# Patient Record
Sex: Female | Born: 1945
Health system: Southern US, Community
[De-identification: ages and names within clinical notes are randomized; demographics above are authoritative.]

## PROBLEM LIST (undated history)

## (undated) DIAGNOSIS — F17200 Nicotine dependence, unspecified, uncomplicated: Secondary | ICD-10-CM

## (undated) DIAGNOSIS — G4730 Sleep apnea, unspecified: Secondary | ICD-10-CM

## (undated) DIAGNOSIS — H5310 Unspecified subjective visual disturbances: Secondary | ICD-10-CM

## (undated) DIAGNOSIS — R0789 Other chest pain: Secondary | ICD-10-CM

## (undated) DIAGNOSIS — E559 Vitamin D deficiency, unspecified: Secondary | ICD-10-CM

## (undated) DIAGNOSIS — R011 Cardiac murmur, unspecified: Secondary | ICD-10-CM

## (undated) DIAGNOSIS — E018 Other iodine-deficiency related thyroid disorders and allied conditions: Secondary | ICD-10-CM

## (undated) DIAGNOSIS — B079 Viral wart, unspecified: Secondary | ICD-10-CM

## (undated) DIAGNOSIS — H3554 Dystrophies primarily involving the retinal pigment epithelium: Secondary | ICD-10-CM

## (undated) DIAGNOSIS — E785 Hyperlipidemia, unspecified: Secondary | ICD-10-CM

## (undated) DIAGNOSIS — M899 Disorder of bone, unspecified: Secondary | ICD-10-CM

## (undated) DIAGNOSIS — M949 Disorder of cartilage, unspecified: Secondary | ICD-10-CM

## (undated) HISTORY — DX: Cardiac murmur, unspecified: R01.1

## (undated) HISTORY — PX: TONSILLECTOMY: SUR1361

## (undated) HISTORY — DX: Unspecified subjective visual disturbances: H53.10

## (undated) HISTORY — PX: ABDOMINAL HYSTERECTOMY: SHX81

## (undated) HISTORY — DX: Disorder of bone, unspecified: M89.9

## (undated) HISTORY — PX: EYE SURGERY: SHX253

## (undated) HISTORY — DX: Vitamin D deficiency, unspecified: E55.9

## (undated) HISTORY — PX: CARPAL TUNNEL RELEASE: SHX101

## (undated) HISTORY — DX: Other chest pain: R07.89

## (undated) HISTORY — DX: Dystrophies primarily involving the retinal pigment epithelium: H35.54

## (undated) HISTORY — DX: Disorder of cartilage, unspecified: M94.9

## (undated) HISTORY — DX: Viral wart, unspecified: B07.9

## (undated) HISTORY — PX: LAPAROSCOPIC OOPHERECTOMY: SHX6507

## (undated) HISTORY — DX: Nicotine dependence, unspecified, uncomplicated: F17.200

## (undated) HISTORY — DX: Hyperlipidemia, unspecified: E78.5

## (undated) HISTORY — DX: Sleep apnea, unspecified: G47.30

## (undated) HISTORY — PX: LITHOTRIPSY: SUR834

## (undated) HISTORY — DX: Other iodine-deficiency related thyroid disorders and allied conditions: E01.8

---

## 2001-09-19 ENCOUNTER — Encounter: Admission: RE | Admit: 2001-09-19 | Discharge: 2001-09-19 | Payer: Self-pay | Admitting: Family Medicine

## 2001-09-19 ENCOUNTER — Encounter: Payer: Self-pay | Admitting: Family Medicine

## 2002-05-08 ENCOUNTER — Encounter: Admission: RE | Admit: 2002-05-08 | Discharge: 2002-05-08 | Payer: Self-pay | Admitting: Family Medicine

## 2002-05-08 ENCOUNTER — Encounter: Payer: Self-pay | Admitting: Family Medicine

## 2003-03-14 ENCOUNTER — Encounter: Admission: RE | Admit: 2003-03-14 | Discharge: 2003-03-14 | Payer: Self-pay | Admitting: Internal Medicine

## 2003-03-14 ENCOUNTER — Encounter: Payer: Self-pay | Admitting: Internal Medicine

## 2005-08-12 ENCOUNTER — Emergency Department (HOSPITAL_COMMUNITY): Admission: EM | Admit: 2005-08-12 | Discharge: 2005-08-12 | Payer: Self-pay | Admitting: Emergency Medicine

## 2007-08-01 ENCOUNTER — Emergency Department (HOSPITAL_COMMUNITY): Admission: EM | Admit: 2007-08-01 | Discharge: 2007-08-01 | Payer: Self-pay | Admitting: Emergency Medicine

## 2007-08-06 ENCOUNTER — Ambulatory Visit (HOSPITAL_COMMUNITY): Admission: RE | Admit: 2007-08-06 | Discharge: 2007-08-06 | Payer: Self-pay | Admitting: Urology

## 2007-10-25 ENCOUNTER — Emergency Department (HOSPITAL_COMMUNITY): Admission: EM | Admit: 2007-10-25 | Discharge: 2007-10-25 | Payer: Self-pay | Admitting: Emergency Medicine

## 2008-03-17 ENCOUNTER — Encounter (INDEPENDENT_AMBULATORY_CARE_PROVIDER_SITE_OTHER): Payer: Self-pay | Admitting: *Deleted

## 2008-04-02 ENCOUNTER — Ambulatory Visit: Payer: Self-pay | Admitting: Family Medicine

## 2008-04-02 DIAGNOSIS — M949 Disorder of cartilage, unspecified: Secondary | ICD-10-CM

## 2008-04-02 DIAGNOSIS — M899 Disorder of bone, unspecified: Secondary | ICD-10-CM | POA: Insufficient documentation

## 2008-04-02 DIAGNOSIS — F17200 Nicotine dependence, unspecified, uncomplicated: Secondary | ICD-10-CM | POA: Insufficient documentation

## 2008-04-02 DIAGNOSIS — B079 Viral wart, unspecified: Secondary | ICD-10-CM | POA: Insufficient documentation

## 2008-04-02 DIAGNOSIS — E018 Other iodine-deficiency related thyroid disorders and allied conditions: Secondary | ICD-10-CM

## 2008-04-04 ENCOUNTER — Encounter (INDEPENDENT_AMBULATORY_CARE_PROVIDER_SITE_OTHER): Payer: Self-pay | Admitting: *Deleted

## 2008-04-23 ENCOUNTER — Ambulatory Visit: Payer: Self-pay | Admitting: Internal Medicine

## 2008-04-23 ENCOUNTER — Encounter: Payer: Self-pay | Admitting: Family Medicine

## 2008-05-14 ENCOUNTER — Telehealth (INDEPENDENT_AMBULATORY_CARE_PROVIDER_SITE_OTHER): Payer: Self-pay | Admitting: *Deleted

## 2008-06-11 ENCOUNTER — Ambulatory Visit: Payer: Self-pay | Admitting: Family Medicine

## 2008-06-11 LAB — CONVERTED CEMR LAB
ALT: 15 units/L (ref 0–35)
AST: 17 units/L (ref 0–37)
Albumin: 3.8 g/dL (ref 3.5–5.2)
BUN: 21 mg/dL (ref 6–23)
Basophils Relative: 0.8 % (ref 0.0–3.0)
CO2: 28 meq/L (ref 19–32)
Chloride: 104 meq/L (ref 96–112)
Creatinine, Ser: 0.8 mg/dL (ref 0.4–1.2)
Direct LDL: 187.6 mg/dL
Eosinophils Absolute: 0.1 10*3/uL (ref 0.0–0.7)
Eosinophils Relative: 1.3 % (ref 0.0–5.0)
GFR calc non Af Amer: 78 mL/min
Glucose, Bld: 91 mg/dL (ref 70–99)
HDL: 49.5 mg/dL (ref >39.0)
MCV: 91.7 fL (ref 78.0–100.0)
Monocytes Relative: 5.6 % (ref 3.0–12.0)
Neutrophils Relative %: 71.7 % (ref 43.0–77.0)
RBC: 4.87 M/uL (ref 3.87–5.11)
Total Protein: 7 g/dL (ref 6.0–8.3)
WBC: 10.6 10*3/uL — ABNORMAL HIGH (ref 4.5–10.5)

## 2008-06-12 ENCOUNTER — Telehealth (INDEPENDENT_AMBULATORY_CARE_PROVIDER_SITE_OTHER): Payer: Self-pay | Admitting: *Deleted

## 2008-06-13 ENCOUNTER — Telehealth (INDEPENDENT_AMBULATORY_CARE_PROVIDER_SITE_OTHER): Payer: Self-pay | Admitting: *Deleted

## 2008-06-16 ENCOUNTER — Encounter: Payer: Self-pay | Admitting: Family Medicine

## 2008-07-30 ENCOUNTER — Ambulatory Visit: Payer: Self-pay | Admitting: Family Medicine

## 2008-07-30 LAB — CONVERTED CEMR LAB
ALT: 19 units/L (ref 0–35)
AST: 19 units/L (ref 0–37)
Albumin: 3.8 g/dL (ref 3.5–5.2)
Total Protein: 7.3 g/dL (ref 6.0–8.3)

## 2008-07-31 ENCOUNTER — Telehealth (INDEPENDENT_AMBULATORY_CARE_PROVIDER_SITE_OTHER): Payer: Self-pay | Admitting: *Deleted

## 2008-07-31 LAB — CONVERTED CEMR LAB: Vit D, 25-Hydroxy: 24 ng/mL — ABNORMAL LOW (ref 30–89)

## 2009-01-05 ENCOUNTER — Ambulatory Visit: Payer: Self-pay | Admitting: Family Medicine

## 2009-01-05 DIAGNOSIS — H5310 Unspecified subjective visual disturbances: Secondary | ICD-10-CM | POA: Insufficient documentation

## 2009-01-05 DIAGNOSIS — E559 Vitamin D deficiency, unspecified: Secondary | ICD-10-CM | POA: Insufficient documentation

## 2009-01-06 ENCOUNTER — Encounter: Admission: RE | Admit: 2009-01-06 | Discharge: 2009-01-06 | Payer: Self-pay | Admitting: Family Medicine

## 2009-01-06 ENCOUNTER — Encounter: Payer: Self-pay | Admitting: Family Medicine

## 2009-01-06 ENCOUNTER — Ambulatory Visit: Payer: Self-pay

## 2009-01-09 ENCOUNTER — Telehealth: Payer: Self-pay | Admitting: Family Medicine

## 2009-01-16 ENCOUNTER — Ambulatory Visit: Payer: Self-pay | Admitting: Family Medicine

## 2009-01-16 DIAGNOSIS — E785 Hyperlipidemia, unspecified: Secondary | ICD-10-CM | POA: Insufficient documentation

## 2009-01-20 ENCOUNTER — Telehealth (INDEPENDENT_AMBULATORY_CARE_PROVIDER_SITE_OTHER): Payer: Self-pay | Admitting: *Deleted

## 2009-01-20 LAB — CONVERTED CEMR LAB
ALT: 15 units/L (ref 0–35)
Alkaline Phosphatase: 65 units/L (ref 39–117)
Bilirubin, Direct: 0 mg/dL (ref 0.0–0.3)
Cholesterol: 176 mg/dL (ref 0–200)
Total Bilirubin: 0.5 mg/dL (ref 0.3–1.2)
Total Protein: 7.1 g/dL (ref 6.0–8.3)

## 2009-03-23 ENCOUNTER — Encounter: Payer: Self-pay | Admitting: Family Medicine

## 2009-05-08 ENCOUNTER — Telehealth (INDEPENDENT_AMBULATORY_CARE_PROVIDER_SITE_OTHER): Payer: Self-pay | Admitting: *Deleted

## 2009-05-12 ENCOUNTER — Telehealth (INDEPENDENT_AMBULATORY_CARE_PROVIDER_SITE_OTHER): Payer: Self-pay | Admitting: *Deleted

## 2010-06-06 DIAGNOSIS — H3554 Dystrophies primarily involving the retinal pigment epithelium: Secondary | ICD-10-CM

## 2010-06-06 HISTORY — DX: Dystrophies primarily involving the retinal pigment epithelium: H35.54

## 2011-02-25 LAB — URINALYSIS, ROUTINE W REFLEX MICROSCOPIC
Glucose, UA: NEGATIVE
Ketones, ur: NEGATIVE
Protein, ur: 30 — AB
Urobilinogen, UA: 0.2

## 2011-02-25 LAB — DIFFERENTIAL
Basophils Relative: 0
Eosinophils Absolute: 0
Eosinophils Relative: 0
Lymphs Abs: 1.4
Monocytes Relative: 3
Neutrophils Relative %: 87 — ABNORMAL HIGH

## 2011-02-25 LAB — URINE MICROSCOPIC-ADD ON

## 2011-02-25 LAB — CBC
HCT: 45.4
MCHC: 34.7
MCV: 87.8
RBC: 5.17 — ABNORMAL HIGH
WBC: 15.3 — ABNORMAL HIGH

## 2011-02-25 LAB — URINE CULTURE: Colony Count: >=100000

## 2011-02-25 LAB — BASIC METABOLIC PANEL
BUN: 15
CO2: 23
Chloride: 110
Creatinine, Ser: 0.77
GFR calc Af Amer: >60
Potassium: 4.1

## 2012-02-07 ENCOUNTER — Encounter: Payer: Self-pay | Admitting: Internal Medicine

## 2012-02-07 ENCOUNTER — Ambulatory Visit (INDEPENDENT_AMBULATORY_CARE_PROVIDER_SITE_OTHER): Payer: Self-pay | Admitting: Internal Medicine

## 2012-02-07 VITALS — BP 122/74 | HR 78 | Temp 98.0°F | Wt 188.0 lb

## 2012-02-07 DIAGNOSIS — Z23 Encounter for immunization: Secondary | ICD-10-CM

## 2012-02-07 DIAGNOSIS — S0993XA Unspecified injury of face, initial encounter: Secondary | ICD-10-CM

## 2012-02-07 NOTE — Progress Notes (Signed)
  Subjective:    Patient ID: Yolanda Reeves, female    DOB: 1945/07/12, 66 y.o.   MRN: [id number]  HPI Acute visit, patient of Dr.Tabori, last office visit  01-2009 Patient was cleaning gutters yesterday, she hit her face and right arm with the iron pole, bled a little from the face, likes to get a tetanus shot  Past Medical History:    BEST eye dz    Depression- sees Dr Emerson Monte    GERd    Seasonal Allergy    Heart Murmur    Kidney Stones    Thyroid ablation    Colon polyps- Colonoscopy 2004    TMJ  Past Surgical History:    hysterectomy 1980 due to endometriosis    BSO  Social History: smokes 1/2 ppd, smoking since 1963 Drinks wine monthly     Review of Systems She has poor vision but it is at baseline. Last Tdap 5 years ago     Objective:   Physical Exam  Constitutional: She appears well-developed. No distress.  HENT:  Head: Normocephalic.         Face is symmetric, not tender to palpation of the orbits, orbits are stable  Eyes: EOM are normal. Pupils are equal, round, and reactive to light. Right eye exhibits no discharge. Left eye exhibits no discharge. No scleral icterus.  Skin: She is not diaphoretic.       Few superficial scratches in the right arm       Assessment & Plan:   Superficial wound and the face, right arm Fortunately no evidence of infection at this point, we'll provide a Tdap,   will call if she sees redness or discharge. Most important, I encouraged her not to put herself on situations of risk like she did yesterday. Also, she is overdue to see Dr. Beverely Low, I encouraged her to call and make an appointment

## 2012-02-07 NOTE — Patient Instructions (Addendum)
Keep an eye on the scratches, if you see any redness, swelling, discharge let us know immediately. Please schedule a routine visit to see Dr. Beverely Low

## 2012-04-02 ENCOUNTER — Ambulatory Visit (INDEPENDENT_AMBULATORY_CARE_PROVIDER_SITE_OTHER): Payer: Medicare Other | Admitting: Family Medicine

## 2012-04-02 ENCOUNTER — Encounter: Payer: Self-pay | Admitting: Family Medicine

## 2012-04-02 VITALS — BP 125/82 | HR 85 | Temp 98.1°F | Ht 61.0 in | Wt 192.4 lb

## 2012-04-02 DIAGNOSIS — R0789 Other chest pain: Secondary | ICD-10-CM | POA: Insufficient documentation

## 2012-04-02 DIAGNOSIS — E785 Hyperlipidemia, unspecified: Secondary | ICD-10-CM

## 2012-04-02 LAB — BASIC METABOLIC PANEL
Chloride: 108 mEq/L (ref 96–112)
Creatinine, Ser: 0.9 mg/dL (ref 0.4–1.2)
Sodium: 142 mEq/L (ref 135–145)

## 2012-04-02 LAB — CBC WITH DIFFERENTIAL/PLATELET
Basophils Absolute: 0 10*3/uL (ref 0.0–0.1)
Eosinophils Absolute: 0.1 10*3/uL (ref 0.0–0.7)
Hemoglobin: 14.1 g/dL (ref 12.0–15.0)
Lymphocytes Relative: 35.8 % (ref 12.0–46.0)
MCHC: 33.1 g/dL (ref 30.0–36.0)
Monocytes Relative: 6.9 % (ref 3.0–12.0)
Neutro Abs: 4.3 10*3/uL (ref 1.4–7.7)
Neutrophils Relative %: 55.3 % (ref 43.0–77.0)
Platelets: 240 10*3/uL (ref 150.0–400.0)
RDW: 13.8 % (ref 11.5–14.6)

## 2012-04-02 LAB — LIPID PANEL
Cholesterol: 244 mg/dL — ABNORMAL HIGH (ref 0–200)
HDL: 48 mg/dL (ref >39.00)
Total CHOL/HDL Ratio: 5
Triglycerides: 186 mg/dL — ABNORMAL HIGH (ref 0.0–149.0)

## 2012-04-02 LAB — HEPATIC FUNCTION PANEL
ALT: 17 U/L (ref 0–35)
AST: 17 U/L (ref 0–37)
Albumin: 3.4 g/dL — ABNORMAL LOW (ref 3.5–5.2)

## 2012-04-02 LAB — LDL CHOLESTEROL, DIRECT: Direct LDL: 169.3 mg/dL

## 2012-04-02 NOTE — Patient Instructions (Signed)
We'll call you with your cardiology appt We'll notify you of your lab results Please go to the ER if you again have chest pain Call with any questions or concerns Welcome Back!

## 2012-04-02 NOTE — Progress Notes (Signed)
  Subjective:    Patient ID: Yolanda Reeves, female    DOB: December 15, 1945, 66 y.o.   MRN: [id number]  HPI Saw Dr Talmage Nap on Friday and told her about episodes of chest pain.  It was recommended that she come here to be seen.  Has had 3 separate episodes described as a 'serious tightening in my chest' leading her to believe 'i'm having a heart attack'.  Had no radiation of pain to neck or shoulder.  Ongoing fatigue.  Episodes will last up to 3 minutes.  Spontaneously resolve.  No associated SOB.  + anxiety regarding chest tightness.  1 episode while vacuuming, 1 while watching TV, 1 while on the computer.  No relation to eating or exertion.  No nausea.  No chest pain w/ exertion or climbing stairs recently.  Last episode occurred 3-4 weeks ago.  No longer smoking.  Hx of hyperlipidemia but not currently on meds and no recent labs available.   Review of Systems For ROS see HPI     Objective:   Physical Exam  Vitals reviewed. Constitutional: She is oriented to person, place, and time. She appears well-developed and well-nourished. No distress.  HENT:  Head: Normocephalic and atraumatic.  Eyes: Conjunctivae normal and EOM are normal. Pupils are equal, round, and reactive to light.  Neck: Normal range of motion. Neck supple. No thyromegaly present.  Cardiovascular: Normal rate, regular rhythm, normal heart sounds and intact distal pulses.   No murmur heard. Pulmonary/Chest: Effort normal and breath sounds normal. No respiratory distress.  Abdominal: Soft. She exhibits no distension. There is no tenderness.  Musculoskeletal: She exhibits no edema.  Lymphadenopathy:    She has no cervical adenopathy.  Neurological: She is alert and oriented to person, place, and time.  Skin: Skin is warm and dry.  Psychiatric: She has a normal mood and affect. Her behavior is normal.          Assessment & Plan:

## 2012-04-02 NOTE — Assessment & Plan Note (Signed)
New.  Pt w/ 3 episodes of atypical CP- last occuring nearly 1 month ago.  Risk factors for cardiac origin include Hyperlipidemia, age, and hx of tobacco use.  No current sxs.  EKG unchanged from previous.  Get labs to assess.  Refer to cards for evaluation and likely stress test.  Encouraged her to go to ER if sxs recur.  Pt expressed understanding and is in agreement w/ plan.

## 2012-04-02 NOTE — Assessment & Plan Note (Signed)
Chronic problem, pt not currently on meds.  Check labs to risk stratify given CP.  Start meds prn.

## 2012-04-06 ENCOUNTER — Telehealth: Payer: Self-pay

## 2012-04-06 MED ORDER — ATORVASTATIN CALCIUM 20 MG PO TABS: 20.0000 mg | ORAL_TABLET | Freq: Every day | ORAL | Status: DC

## 2012-04-06 NOTE — Telephone Encounter (Signed)
Spoke to pt to advise: Result Note     Total cholesterol and LDL are both elevated.  Based on this- would start Lipitor 20mg  nightly and recheck LFTs in 6-8 weeks.  Needs f/u OV in 6 months   Scheduled pt lab appt 05/22/12 9:15am, OV 09/26/11 11am. Pt verified pharmacy Rx sent pt aware. Pt stated understanding.   MW

## 2012-04-24 ENCOUNTER — Encounter: Payer: Self-pay | Admitting: *Deleted

## 2012-04-24 ENCOUNTER — Encounter: Payer: Self-pay | Admitting: Cardiovascular Disease

## 2012-04-24 ENCOUNTER — Ambulatory Visit (INDEPENDENT_AMBULATORY_CARE_PROVIDER_SITE_OTHER): Payer: Medicare Other | Admitting: Cardiovascular Disease

## 2012-04-24 VITALS — BP 138/80 | HR 72 | Ht 62.0 in | Wt 188.0 lb

## 2012-04-24 DIAGNOSIS — R079 Chest pain, unspecified: Secondary | ICD-10-CM

## 2012-04-24 DIAGNOSIS — E785 Hyperlipidemia, unspecified: Secondary | ICD-10-CM

## 2012-04-24 DIAGNOSIS — R0789 Other chest pain: Secondary | ICD-10-CM

## 2012-04-24 NOTE — Patient Instructions (Signed)
Your physician recommends that you schedule a follow-up appointment in:  AS NEEDED  Your physician has requested that you have en exercise stress myoview. For further information please visit [email or web address] Please follow instruction sheet, as given. DX CHEST PAIN

## 2012-04-24 NOTE — Progress Notes (Signed)
Patient ID: Yolanda Reeves, female   DOB: 28-Jan-1946, 66 y.o.   MRN: [id number] Jeral Pinch  on 10/28  With  chest pain.  Has had 3 separate episodes described as a 'serious tightening in my chest' leading her to believe 'i'm having a heart attack'. Had no radiation of pain to neck or shoulder. Ongoing fatigue. Episodes will last up to 3 minutes. Spontaneously resolve. No associated SOB. + anxiety regarding chest tightness. 1 episode while vacuuming, 1 while watching TV, 1 while on the computer. No relation to eating or exertion. No nausea. No chest pain w/ exertion or climbing stairs recently. Last episode occurred 3-4 weeks ago. No longer smoking. Hx of hyperlipidemia but not currently on meds and no recent labs available.  Also active at [employer] sne[employer] and walking 3 dogs with no recent pain  ROS: Denies fever, malais, weight loss, blurry vision, decreased visual acuity, cough, sputum, SOB, hemoptysis, pleuritic pain, palpitaitons, heartburn, abdominal pain, melena, lower extremity edema, claudication, or rash.  All other systems reviewed and negative   General: Affect appropriate Healthy:  appears stated age HEENT: normal Neck supple with no adenopathy JVP normal no bruits no thyromegaly Lungs clear with no wheezing and good diaphragmatic motion Heart:  S1/S2 no murmur,rub, gallop or click PMI normal Abdomen: benighn, BS positve, no tenderness, no AAA no bruit.  No HSM or HJR Distal pulses intact with no bruits No edema Neuro non-focal Skin warm and dry No muscular weakness  Medications Current Outpatient Prescriptions  Medication Sig Dispense Refill  . atorvastatin (LIPITOR) 20 MG tablet Take 1 tablet (20 mg total) by mouth at bedtime.  90 tablet  3  . diazepam (VALIUM) 5 MG tablet Take 5 mg by mouth daily.      . ergocalciferol (VITAMIN D2) 50000 UNITS capsule Take 50,000 Units by mouth once a week.      . lamoTRIgine (LAMICTAL) 150 MG tablet Take 150 mg by mouth daily.      Marland Kitchen  levothyroxine (SYNTHROID, LEVOTHROID) 137 MCG tablet Take 137 mcg by mouth daily.      . sertraline (ZOLOFT) 100 MG tablet Take 100 mg by mouth daily.      Marland Kitchen zolpidem (AMBIEN) 10 MG tablet Take 10 mg by mouth at bedtime as needed.        Allergies Review of patient's allergies indicates no known allergies.  Family History: History reviewed. No pertinent family history.  Social History: History   Social History  . Marital Status: Divorced    Spouse Name: N/A    Number of Children: N/A  . Years of Education: N/A   Occupational History  . Not on file.   Social History Main Topics  . Smoking status: Never Smoker   . Smokeless tobacco: Not on file  . Alcohol Use: Yes     Comment: rarely  . Drug Use: No  . Sexually Active: Not on file   Other Topics Concern  . Not on file   Social History Narrative  . No narrative on file    Electrocardiogram:  NSR rate 63  Low voltage isolated q in lead 3  Nonspecific St/T wave changes no change compared to ECG 06/11/08  Assessment and Plan

## 2012-04-24 NOTE — Assessment & Plan Note (Signed)
Cholesterol is at goal.  Continue current dose of statin and diet Rx.  No myalgias or side effects.  F/U  LFT's in 6 months. Lab Results  Component Value Date   LDLCALC 104* 01/16/2009

## 2012-04-24 NOTE — Assessment & Plan Note (Signed)
Atypical but severe ECG nondiagnostic but abnormal  F/U stress myovue

## 2012-05-08 ENCOUNTER — Encounter (HOSPITAL_COMMUNITY): Payer: Medicare Other

## 2012-05-14 ENCOUNTER — Other Ambulatory Visit: Payer: Self-pay | Admitting: *Deleted

## 2012-05-14 DIAGNOSIS — R079 Chest pain, unspecified: Secondary | ICD-10-CM

## 2012-05-22 ENCOUNTER — Other Ambulatory Visit (INDEPENDENT_AMBULATORY_CARE_PROVIDER_SITE_OTHER): Payer: Medicare Other

## 2012-05-22 DIAGNOSIS — E785 Hyperlipidemia, unspecified: Secondary | ICD-10-CM

## 2012-05-22 LAB — HEPATIC FUNCTION PANEL
Albumin: 4.2 g/dL (ref 3.5–5.2)
Total Bilirubin: 0.5 mg/dL (ref 0.3–1.2)

## 2012-05-22 NOTE — Progress Notes (Signed)
Labs only

## 2012-05-22 NOTE — Addendum Note (Signed)
Addended by: Silvio Pate D on: 05/22/2012 09:12 AM   Modules accepted: Orders

## 2012-05-25 ENCOUNTER — Encounter: Payer: Self-pay | Admitting: *Deleted

## 2012-07-12 ENCOUNTER — Other Ambulatory Visit: Payer: Self-pay | Admitting: *Deleted

## 2012-07-12 ENCOUNTER — Telehealth: Payer: Self-pay | Admitting: Cardiovascular Disease

## 2012-07-12 DIAGNOSIS — R079 Chest pain, unspecified: Secondary | ICD-10-CM

## 2012-07-12 NOTE — Telephone Encounter (Signed)
NOTED ./CY 

## 2012-07-12 NOTE — Telephone Encounter (Signed)
Spoke with Yolanda Reeves to see if she was ready to schedule her Treadmill test.   Per patient she has spoke with her PC MD and was told her problem was Esophageal spasm. Don't need the treadmill.

## 2012-08-30 ENCOUNTER — Encounter: Payer: Self-pay | Admitting: Family Medicine

## 2012-08-30 ENCOUNTER — Ambulatory Visit (INDEPENDENT_AMBULATORY_CARE_PROVIDER_SITE_OTHER): Payer: Medicare Other | Admitting: Family Medicine

## 2012-08-30 VITALS — BP 150/80 | HR 60 | Temp 97.7°F | Ht 61.25 in | Wt 191.6 lb

## 2012-08-30 DIAGNOSIS — Z23 Encounter for immunization: Secondary | ICD-10-CM

## 2012-08-30 DIAGNOSIS — E785 Hyperlipidemia, unspecified: Secondary | ICD-10-CM

## 2012-08-30 DIAGNOSIS — E018 Other iodine-deficiency related thyroid disorders and allied conditions: Secondary | ICD-10-CM

## 2012-08-30 DIAGNOSIS — R030 Elevated blood-pressure reading, without diagnosis of hypertension: Secondary | ICD-10-CM

## 2012-08-30 DIAGNOSIS — M949 Disorder of cartilage, unspecified: Secondary | ICD-10-CM

## 2012-08-30 DIAGNOSIS — M899 Disorder of bone, unspecified: Secondary | ICD-10-CM

## 2012-08-30 DIAGNOSIS — Z0000 Encounter for general adult medical examination without abnormal findings: Secondary | ICD-10-CM | POA: Insufficient documentation

## 2012-08-30 DIAGNOSIS — E559 Vitamin D deficiency, unspecified: Secondary | ICD-10-CM

## 2012-08-30 DIAGNOSIS — Z1231 Encounter for screening mammogram for malignant neoplasm of breast: Secondary | ICD-10-CM

## 2012-08-30 LAB — CBC WITH DIFFERENTIAL/PLATELET
Basophils Relative: 0.6 % (ref 0.0–3.0)
Eosinophils Relative: 1.1 % (ref 0.0–5.0)
HCT: 42.4 % (ref 36.0–46.0)
Hemoglobin: 14.3 g/dL (ref 12.0–15.0)
Lymphs Abs: 2.4 10*3/uL (ref 0.7–4.0)
MCV: 93.1 fl (ref 78.0–100.0)
Monocytes Relative: 5.5 % (ref 3.0–12.0)
Neutro Abs: 3.4 10*3/uL (ref 1.4–7.7)
WBC: 6.2 10*3/uL (ref 4.5–10.5)

## 2012-08-30 LAB — BASIC METABOLIC PANEL
BUN: 15 mg/dL (ref 6–23)
CO2: 26 mEq/L (ref 19–32)
Calcium: 8.7 mg/dL (ref 8.4–10.5)
Creatinine, Ser: 0.6 mg/dL (ref 0.4–1.2)
Glucose, Bld: 88 mg/dL (ref 70–99)

## 2012-08-30 LAB — LIPID PANEL
Cholesterol: 232 mg/dL — ABNORMAL HIGH (ref 0–200)
HDL: 57.3 mg/dL (ref >39.00)
Total CHOL/HDL Ratio: 4
Triglycerides: 146 mg/dL (ref 0.0–149.0)
VLDL: 29.2 mg/dL (ref 0.0–40.0)

## 2012-08-30 LAB — HEPATIC FUNCTION PANEL
Albumin: 3.7 g/dL (ref 3.5–5.2)
Alkaline Phosphatase: 72 U/L (ref 39–117)
Total Protein: 6.9 g/dL (ref 6.0–8.3)

## 2012-08-30 LAB — TSH: TSH: 0.67 u[IU]/mL (ref 0.35–5.50)

## 2012-08-30 LAB — LDL CHOLESTEROL, DIRECT: Direct LDL: 145.5 mg/dL

## 2012-08-30 NOTE — Patient Instructions (Addendum)
Follow up in 6 months to recheck cholesterol- sooner if needed We'll notify you of your lab results and make any changes if needed Someone will call you with your mammo and bone density Call with any questions or concerns CONGRATS on quitting smoking! Happy Spring!

## 2012-08-30 NOTE — Progress Notes (Signed)
  Subjective:    Patient ID: Yolanda Reeves , female    DOB: Mar 28, 1946, 67 y.o.   MRN: [id number]  HPI Here today for CPE.  Risk Factors: Hyperlipidemia- chronic problem, pt not taking lipitor as directed.  Started meds but did not refill. Osteopenia- chronic problem, last DEXA 4-5 yrs ago.  Taking Ca+ Vit D supplement. Elevated BP- new.  Pt reports increased stress. No hx of elevated pressure.  No CP, SOB, HAs, visual changes, edema. Physical Activity: attempting to exercise Fall Risk:  Low risk but increasing due to deterioration of eyesight (best's disease) Depression: chronic problem, following w/ Psych regularly Hearing: normal to conversational tones, decreased to whispered voice ADL's: independent Cognitive: normal linear thought process, memory and attention intact. Home Safety: safe at home Height, Weight, BMI, Visual Acuity: see vitals, vision corrected to 20/20 w/ glasses Counseling: no need for pap due to hysterectomy, overdue for mammo and DEXA.  UTD on colonoscopy Labs Ordered: See A&P Care Plan: See A&P    Review of Systems Patient reports no hearing changes, adenopathy,fever, weight change,  persistant/recurrent hoarseness , swallowing issues, chest pain, palpitations, edema, persistant/recurrent cough, hemoptysis, dyspnea (rest/exertional/paroxysmal nocturnal), gastrointestinal bleeding (melena, rectal bleeding), abdominal pain, significant heartburn, bowel changes, GU symptoms (dysuria, hematuria, incontinence), Gyn symptoms (abnormal  bleeding, pain),  syncope, focal weakness, memory loss, numbness & tingling, skin/hair/nail changes, abnormal bruising or bleeding, anxiety, or depression.     Objective:   Physical Exam General Appearance:    Alert, cooperative, no distress, appears stated age  Head:    Normocephalic, without obvious abnormality, atraumatic  Eyes:    PERRL, conjunctiva/corneas clear, EOM's intact, fundi    benign, both eyes  Ears:    Normal TM's and  external ear canals, both ears  Nose:   Nares normal, septum midline, mucosa normal, no drainage    or sinus tenderness  Throat:   Lips, mucosa, and tongue normal; teeth and gums normal  Neck:   Supple, symmetrical, trachea midline, no adenopathy;    Thyroid: no enlargement/tenderness/nodules  Back:     Symmetric, no curvature, ROM normal, no CVA tenderness  Lungs:     Clear to auscultation bilaterally, respirations unlabored  Chest Wall:    No tenderness or deformity   Heart:    Regular rate and rhythm, S1 and S2 normal, no murmur, rub   or gallop  Breast Exam:    Deferred to mammo  Abdomen:     Soft, non-tender, bowel sounds active all four quadrants,    no masses, no organomegaly  Genitalia:    Deferred due to hysterectomy  Rectal:    Extremities:   Extremities normal, atraumatic, no cyanosis or edema  Pulses:   2+ and symmetric all extremities  Skin:   Skin color, texture, turgor normal, no rashes or lesions  Lymph nodes:   Cervical, supraclavicular, and axillary nodes normal  Neurologic:   CNII-XII intact, normal strength, sensation and reflexes    throughout          Assessment & Plan:

## 2012-08-31 ENCOUNTER — Telehealth: Payer: Self-pay | Admitting: *Deleted

## 2012-08-31 DIAGNOSIS — E785 Hyperlipidemia, unspecified: Secondary | ICD-10-CM

## 2012-08-31 MED ORDER — ATORVASTATIN CALCIUM 20 MG PO TABS: 20.0000 mg | ORAL_TABLET | Freq: Every day | ORAL | Status: DC

## 2012-08-31 NOTE — Assessment & Plan Note (Signed)
Check labs.  Replete prn. 

## 2012-08-31 NOTE — Assessment & Plan Note (Signed)
Pt's PE WNL.  Due for mammo and DEXA- referral made.  UTD on colonoscopy.  No need for pap due to hysterectomy.  Check labs.  Anticipatory guidance provided.

## 2012-08-31 NOTE — Assessment & Plan Note (Signed)
Ongoing problem.  On Ca+ Vit D daily.  Due for DEXA- referral made.

## 2012-08-31 NOTE — Telephone Encounter (Signed)
Message copied by Verdie Shire on Fri Aug 31, 2012  5:25 PM ------      Message from: Sheliah Hatch      Created: Fri Aug 31, 2012  8:13 AM       Cholesterol is better than previous but still not at goal.  Needs to restart Lipitor nightly.  Repeat labs in 6 months.  Remainder of labs look good! ------

## 2012-08-31 NOTE — Assessment & Plan Note (Signed)
Pt stopped Lipitor when she forgot to refill it.  Had no problems tolerating meds.  Check labs.  Adjust meds prn

## 2012-08-31 NOTE — Telephone Encounter (Signed)
Informed pt of recent lab results and note.  Pt understood and agreed.  New rx for the Lipitor 20mg  sent to the pharmacy by e-script.  Order entered for future labs.//AB/CMA

## 2012-08-31 NOTE — Assessment & Plan Note (Signed)
New.  Pt feels this is stress related.  No hx of similar.  Asymptomatic.  Encouraged pt to check BP at home and report if pressure consistently >140/90.  Pt expressed understanding and is in agreement w/ plan.

## 2012-08-31 NOTE — Assessment & Plan Note (Signed)
Chronic problem.  Asymptomatic.  Check labs.  Adjust meds prn  

## 2012-09-02 LAB — VITAMIN D 1,25 DIHYDROXY
Vitamin D 1, 25 (OH)2 Total: 78 pg/mL — ABNORMAL HIGH (ref 18–72)
Vitamin D3 1, 25 (OH)2: 78 pg/mL

## 2012-09-25 ENCOUNTER — Ambulatory Visit: Payer: Medicare Other | Admitting: Family Medicine

## 2012-10-10 ENCOUNTER — Ambulatory Visit
Admission: RE | Admit: 2012-10-10 | Discharge: 2012-10-10 | Disposition: A | Discharge status: Home / Self Care | Payer: Medicare Other | Source: Ambulatory Visit | Attending: Family Medicine | Admitting: Family Medicine

## 2012-10-10 DIAGNOSIS — M899 Disorder of bone, unspecified: Secondary | ICD-10-CM

## 2012-10-10 DIAGNOSIS — Z1231 Encounter for screening mammogram for malignant neoplasm of breast: Secondary | ICD-10-CM

## 2012-10-17 ENCOUNTER — Other Ambulatory Visit: Payer: Self-pay | Admitting: Family Medicine

## 2012-10-17 ENCOUNTER — Encounter: Payer: Medicare Other | Admitting: Family Medicine

## 2012-10-17 DIAGNOSIS — R928 Other abnormal and inconclusive findings on diagnostic imaging of breast: Secondary | ICD-10-CM

## 2012-10-17 NOTE — Progress Notes (Signed)
This encounter was created in error - please disregard.

## 2012-10-31 ENCOUNTER — Ambulatory Visit
Admission: RE | Admit: 2012-10-31 | Discharge: 2012-10-31 | Disposition: A | Discharge status: Home / Self Care | Payer: Medicare Other | Source: Ambulatory Visit | Attending: Family Medicine | Admitting: Family Medicine

## 2012-10-31 DIAGNOSIS — R928 Other abnormal and inconclusive findings on diagnostic imaging of breast: Secondary | ICD-10-CM

## 2013-02-20 ENCOUNTER — Ambulatory Visit: Payer: Medicare Other | Admitting: Family Medicine

## 2013-03-07 ENCOUNTER — Other Ambulatory Visit: Payer: Self-pay | Admitting: Family Medicine

## 2013-03-07 NOTE — Telephone Encounter (Signed)
Med filled for only 30days. Pt needs OV>

## 2013-05-16 ENCOUNTER — Ambulatory Visit (INDEPENDENT_AMBULATORY_CARE_PROVIDER_SITE_OTHER): Payer: Medicare Other | Admitting: Family Medicine

## 2013-05-16 ENCOUNTER — Encounter: Payer: Self-pay | Admitting: Family Medicine

## 2013-05-16 VITALS — BP 130/82 | HR 77 | Temp 97.8°F | Ht 61.25 in | Wt 209.4 lb

## 2013-05-16 DIAGNOSIS — Z0000 Encounter for general adult medical examination without abnormal findings: Secondary | ICD-10-CM

## 2013-05-16 DIAGNOSIS — E785 Hyperlipidemia, unspecified: Secondary | ICD-10-CM

## 2013-05-16 DIAGNOSIS — E669 Obesity, unspecified: Secondary | ICD-10-CM

## 2013-05-16 DIAGNOSIS — Z23 Encounter for immunization: Secondary | ICD-10-CM

## 2013-05-16 LAB — CBC WITH DIFFERENTIAL/PLATELET
Basophils Relative: 0.5 % (ref 0.0–3.0)
Eosinophils Absolute: 0.1 10*3/uL (ref 0.0–0.7)
HCT: 41.3 % (ref 36.0–46.0)
Lymphs Abs: 2.3 10*3/uL (ref 0.7–4.0)
MCHC: 33.8 g/dL (ref 30.0–36.0)
MCV: 90.6 fl (ref 78.0–100.0)
Monocytes Absolute: 0.3 10*3/uL (ref 0.1–1.0)
Neutrophils Relative %: 56.7 % (ref 43.0–77.0)
Platelets: 241 10*3/uL (ref 150.0–400.0)

## 2013-05-16 LAB — BASIC METABOLIC PANEL
BUN: 16 mg/dL (ref 6–23)
Calcium: 8.7 mg/dL (ref 8.4–10.5)
GFR: 108.24 mL/min (ref >60.00)
Glucose, Bld: 91 mg/dL (ref 70–99)
Potassium: 4.1 mEq/L (ref 3.5–5.1)
Sodium: 141 mEq/L (ref 135–145)

## 2013-05-16 LAB — LIPID PANEL
Cholesterol: 200 mg/dL (ref 0–200)
HDL: 54.9 mg/dL (ref >39.00)
Triglycerides: 109 mg/dL (ref 0.0–149.0)
VLDL: 21.8 mg/dL (ref 0.0–40.0)

## 2013-05-16 LAB — HEPATIC FUNCTION PANEL
AST: 19 U/L (ref 0–37)
Albumin: 4 g/dL (ref 3.5–5.2)
Total Bilirubin: 0.3 mg/dL (ref 0.3–1.2)

## 2013-05-16 LAB — TSH: TSH: 0.97 u[IU]/mL (ref 0.35–5.50)

## 2013-05-16 MED ORDER — PANTOPRAZOLE SODIUM 40 MG PO TBEC: 40.0000 mg | DELAYED_RELEASE_TABLET | Freq: Every day | ORAL | Status: DC

## 2013-05-16 NOTE — Progress Notes (Signed)
Pre visit review using our clinic review tool, if applicable. No additional management support is needed unless otherwise documented below in the visit note. 

## 2013-05-16 NOTE — Patient Instructions (Signed)
Schedule your medicare wellness visit in 6 months We'll notify you of your lab results and make any changes if needed Start the Protonix daily for reflux Call with any questions or concerns Happy Holidays!

## 2013-05-16 NOTE — Progress Notes (Signed)
   Subjective:    Patient ID: Yolanda Reeves, female    DOB: 1945/11/10, 67 y.o.   MRN: [id number]  HPI CPE- pt had wellness exam on 08/30/12.  UTD on colonoscopy, mammo/DEXA, no need for paps due to TAH.   Review of Systems Patient reports no vision/ hearing changes, adenopathy,fever, weight change,  persistant/recurrent hoarseness , swallowing issues, chest pain, palpitations, edema, persistant/recurrent cough, hemoptysis, dyspnea (rest/exertional/paroxysmal nocturnal), gastrointestinal bleeding (melena, rectal bleeding), abdominal pain, bowel changes, GU symptoms (dysuria, hematuria, incontinence), Gyn symptoms (abnormal  bleeding, pain),  syncope, focal weakness, memory loss, numbness & tingling, skin/hair/nail changes, abnormal bruising or bleeding, anxiety, or depression.  + GERD    Objective:   Physical Exam General Appearance:    Alert, cooperative, no distress, appears stated age  Head:    Normocephalic, without obvious abnormality, atraumatic  Eyes:    PERRL, conjunctiva/corneas clear, EOM's intact, fundi    benign, both eyes  Ears:    Normal TM's and external ear canals, both ears  Nose:   Nares normal, septum midline, mucosa normal, no drainage    or sinus tenderness  Throat:   Lips, mucosa, and tongue normal; teeth and gums normal  Neck:   Supple, symmetrical, trachea midline, no adenopathy;    Thyroid: no enlargement/tenderness/nodules  Back:     Symmetric, no curvature, ROM normal, no CVA tenderness  Lungs:     Clear to auscultation bilaterally, respirations unlabored  Chest Wall:    No tenderness or deformity   Heart:    Regular rate and rhythm, S1 and S2 normal, no murmur, rub   or gallop  Breast Exam:    Deferred to mammo at pt's request  Abdomen:     Soft, non-tender, bowel sounds active all four quadrants,    no masses, no organomegaly  Genitalia:    Deferred at pt's request  Rectal:    Extremities:   Extremities normal, atraumatic, no cyanosis or edema  Pulses:    2+ and symmetric all extremities  Skin:   Skin color, texture, turgor normal, no rashes or lesions  Lymph nodes:   Cervical, supraclavicular, and axillary nodes normal  Neurologic:   CNII-XII intact, normal strength, sensation and reflexes    throughout          Assessment & Plan:

## 2013-05-17 ENCOUNTER — Encounter: Payer: Self-pay | Admitting: General Practice

## 2013-05-19 DIAGNOSIS — E669 Obesity, unspecified: Secondary | ICD-10-CM | POA: Insufficient documentation

## 2013-05-19 NOTE — Assessment & Plan Note (Signed)
Pt's PE WNL w/ exception of obesity.  UTD on health maintenance.  Check labs.  Anticipatory guidance provided.  

## 2013-05-19 NOTE — Assessment & Plan Note (Signed)
Encouraged regular, aerobic activity for 30 minutes at least 4x/week and monitoring caloric intake w/ the help of [employer]itness[employer] app.  

## 2013-09-20 ENCOUNTER — Other Ambulatory Visit: Payer: Self-pay | Admitting: Family Medicine

## 2013-09-20 NOTE — Telephone Encounter (Signed)
Med filled.  

## 2013-10-21 ENCOUNTER — Other Ambulatory Visit: Payer: Self-pay | Admitting: Internal Medicine

## 2013-10-21 ENCOUNTER — Ambulatory Visit
Admission: RE | Admit: 2013-10-21 | Discharge: 2013-10-21 | Disposition: A | Discharge status: Home / Self Care | Payer: Medicare Other | Source: Ambulatory Visit | Attending: Internal Medicine | Admitting: Internal Medicine

## 2013-10-21 DIAGNOSIS — M25569 Pain in unspecified knee: Secondary | ICD-10-CM

## 2013-12-12 ENCOUNTER — Other Ambulatory Visit: Payer: Self-pay | Admitting: Obstetrics & Gynecology

## 2013-12-12 DIAGNOSIS — Z1231 Encounter for screening mammogram for malignant neoplasm of breast: Secondary | ICD-10-CM

## 2013-12-12 DIAGNOSIS — N63 Unspecified lump in unspecified breast: Secondary | ICD-10-CM

## 2013-12-13 ENCOUNTER — Other Ambulatory Visit: Payer: Self-pay | Admitting: Family Medicine

## 2013-12-13 DIAGNOSIS — N63 Unspecified lump in unspecified breast: Secondary | ICD-10-CM

## 2013-12-20 ENCOUNTER — Other Ambulatory Visit: Payer: Medicare Other

## 2014-01-23 ENCOUNTER — Other Ambulatory Visit: Payer: Self-pay | Admitting: Family Medicine

## 2014-01-23 NOTE — Telephone Encounter (Signed)
Med filled.  

## 2014-05-22 ENCOUNTER — Other Ambulatory Visit: Payer: Self-pay | Admitting: Family Medicine

## 2014-05-22 NOTE — Telephone Encounter (Signed)
Med filled and letter mailed to pt to make an appt.  

## 2014-09-02 ENCOUNTER — Other Ambulatory Visit: Payer: Self-pay

## 2014-10-03 DIAGNOSIS — H3554 Dystrophies primarily involving the retinal pigment epithelium: Secondary | ICD-10-CM | POA: Diagnosis not present

## 2014-10-03 DIAGNOSIS — H4321 Crystalline deposits in vitreous body, right eye: Secondary | ICD-10-CM | POA: Diagnosis not present

## 2014-11-25 DIAGNOSIS — H5372 Impaired contrast sensitivity: Secondary | ICD-10-CM | POA: Diagnosis not present

## 2014-11-25 DIAGNOSIS — H5371 Glare sensitivity: Secondary | ICD-10-CM | POA: Diagnosis not present

## 2014-11-25 DIAGNOSIS — H3554 Dystrophies primarily involving the retinal pigment epithelium: Secondary | ICD-10-CM | POA: Diagnosis not present

## 2014-11-27 DIAGNOSIS — H3554 Dystrophies primarily involving the retinal pigment epithelium: Secondary | ICD-10-CM | POA: Diagnosis not present

## 2014-11-27 DIAGNOSIS — H5372 Impaired contrast sensitivity: Secondary | ICD-10-CM | POA: Diagnosis not present

## 2014-11-27 DIAGNOSIS — H5371 Glare sensitivity: Secondary | ICD-10-CM | POA: Diagnosis not present

## 2014-12-01 DIAGNOSIS — H5371 Glare sensitivity: Secondary | ICD-10-CM | POA: Diagnosis not present

## 2014-12-01 DIAGNOSIS — H5372 Impaired contrast sensitivity: Secondary | ICD-10-CM | POA: Diagnosis not present

## 2014-12-01 DIAGNOSIS — H3554 Dystrophies primarily involving the retinal pigment epithelium: Secondary | ICD-10-CM | POA: Diagnosis not present

## 2014-12-02 DIAGNOSIS — H5371 Glare sensitivity: Secondary | ICD-10-CM | POA: Diagnosis not present

## 2014-12-02 DIAGNOSIS — H5372 Impaired contrast sensitivity: Secondary | ICD-10-CM | POA: Diagnosis not present

## 2014-12-02 DIAGNOSIS — H3554 Dystrophies primarily involving the retinal pigment epithelium: Secondary | ICD-10-CM | POA: Diagnosis not present

## 2014-12-04 ENCOUNTER — Ambulatory Visit (INDEPENDENT_AMBULATORY_CARE_PROVIDER_SITE_OTHER): Payer: Medicare Other | Admitting: Family Medicine

## 2014-12-04 VITALS — BP 120/78 | HR 71 | Temp 97.7°F | Resp 16 | Ht 61.75 in | Wt 205.0 lb

## 2014-12-04 DIAGNOSIS — R21 Rash and other nonspecific skin eruption: Secondary | ICD-10-CM

## 2014-12-04 DIAGNOSIS — L237 Allergic contact dermatitis due to plants, except food: Secondary | ICD-10-CM

## 2014-12-04 DIAGNOSIS — L299 Pruritus, unspecified: Secondary | ICD-10-CM | POA: Diagnosis not present

## 2014-12-04 MED ORDER — METHYLPREDNISOLONE 4 MG PO TBPK: ORAL_TABLET | ORAL | Status: DC

## 2014-12-04 MED ORDER — METHYLPREDNISOLONE ACETATE 80 MG/ML IJ SUSP
80.0000 mg | Freq: Once | INTRAMUSCULAR | Status: AC
  Administered 2014-12-04: 80 mg via INTRAMUSCULAR

## 2014-12-04 NOTE — Progress Notes (Signed)
 Chief Complaint:  Chief Complaint  Patient presents with  . Poison Ivy    x 4 days; arms and neck     HPI: Yolanda Reeves is a 69 y.o. female who is here for   Four-day history of poison ivy rash on her arms and neck. She is itching. She is taking Benadryl 25 mg every 8 hours without relief. She is also use clear calamine lotion. She denies any fevers chills shortness of breath chest pain. She is working in her yard and apparently she has progressive blindness and a disease called best disease of doesn't allow her to have contrast. She can't see green on green plants.  Past Medical History  Diagnosis Date  . WART, RIGHT HAND   . HYPOTHYROIDISM, POST-RADIOACTIVE IODINE   . VITAMIN D DEFICIENCY   . HYPERLIPIDEMIA   . TOBACCO USE   . UNSPECIFIED SUBJECTIVE VISUAL DISTURBANCE   . OSTEOPENIA   . Chest pain, atypical   . Best dystrophy 2012   History reviewed. No pertinent past surgical history. History   Social History  . Marital Status: Divorced    Spouse Name: N/A  . Number of Children: N/A  . Years of Education: N/A   Social History Main Topics  . Smoking status: Never Smoker   . Smokeless tobacco: Not on file  . Alcohol Use: Yes     Comment: rarely  . Drug Use: No  . Sexual Activity: Not on file   Other Topics Concern  . None   Social History Narrative   Family History  Problem Relation Age of Onset  . Cancer Brother     thyroid   No Known Allergies Prior to Admission medications   Medication Sig Start Date End Date Taking? Authorizing Provider  atorvastatin (LIPITOR) 20 MG tablet take 1 tablet by mouth at bedtime 03/07/13  Yes Sheliah HatchKatherine E Tabori, MD  diazepam (VALIUM) 5 MG tablet Take 5 mg by mouth daily.   Yes Historical Provider, MD  ergocalciferol (VITAMIN D2) 50000 UNITS capsule Take 50,000 Units by mouth once a week.   Yes Historical Provider, MD  lamoTRIgine (LAMICTAL) 150 MG tablet Take 150 mg by mouth daily.   Yes Historical Provider, MD    levothyroxine (SYNTHROID, LEVOTHROID) 137 MCG tablet Take 137 mcg by mouth daily.   Yes Historical Provider, MD  sertraline (ZOLOFT) 100 MG tablet Take 100 mg by mouth daily.   Yes Historical Provider, MD  zolpidem (AMBIEN) 10 MG tablet Take 10 mg by mouth at bedtime as needed.   Yes Historical Provider, MD  pantoprazole (PROTONIX) 40 MG tablet take 1 tablet by mouth once daily Patient not taking: Reported on 12/04/2014 05/22/14   Sheliah HatchKatherine E Tabori, MD     ROS: The patient denies fevers, chills, night sweats, unintentional weight loss, chest pain, palpitations, wheezing, dyspnea on exertion, nausea, vomiting, abdominal pain, dysuria, hematuria, melena, numbness, weakness, or tingling.   All other systems have been reviewed and were otherwise negative with the exception of those mentioned in the HPI and as above.    PHYSICAL EXAM: Filed Vitals:   12/04/14 1012  BP: 120/78  Pulse: 71  Temp: 97.7 F (36.5 C)  Resp: 16   Filed Vitals:   12/04/14 1012  Height: 5' 1.75" (1.568 m)  Weight: 205 lb (92.987 kg)   Body mass index is 37.82 kg/(m^2).   General: Alert, no acute distress HEENT:  Normocephalic, atraumatic, oropharynx patent. EOMI, PERRLA Cardiovascular:  Regular rate  and rhythm, no rubs murmurs or gallops.  No Carotid bruits, radial pulse intact. No pedal edema.  Respiratory: Clear to auscultation bilaterally.  No wheezes, rales, or rhonchi.  No cyanosis, no use of accessory musculature GI: No organomegaly, abdomen is soft and non-tender, positive bowel sounds.  No masses. Skin:  Pruritic vesicular, excoriated erythematous diffuse rashes c/w poison ivy Neurologic: Facial musculature symmetric. Psychiatric: Patient is appropriate throughout our interaction. Lymphatic: No cervical lymphadenopathy Musculoskeletal: Gait intact.   LABS: Results for orders placed or performed in visit on 05/16/13  CBC with Differential  Result Value Ref Range   WBC 6.4 4.5 - 10.5 K/uL   RBC  4.56 3.87 - 5.11 Mil/uL   Hemoglobin 14.0 12.0 - 15.0 g/dL   HCT 40.9 81.1 - 91.4 %   MCV 90.6 78.0 - 100.0 fl   MCHC 33.8 30.0 - 36.0 g/dL   RDW 78.2 95.6 - 21.3 %   Platelets 241.0 150.0 - 400.0 K/uL   Neutrophils Relative % 56.7 43.0 - 77.0 %   Lymphocytes Relative 36.2 12.0 - 46.0 %   Monocytes Relative 5.3 3.0 - 12.0 %   Eosinophils Relative 1.3 0.0 - 5.0 %   Basophils Relative 0.5 0.0 - 3.0 %   Neutro Abs 3.6 1.4 - 7.7 K/uL   Lymphs Abs 2.3 0.7 - 4.0 K/uL   Monocytes Absolute 0.3 0.1 - 1.0 K/uL   Eosinophils Absolute 0.1 0.0 - 0.7 K/uL   Basophils Absolute 0.0 0.0 - 0.1 K/uL  Basic metabolic panel  Result Value Ref Range   Sodium 141 135 - 145 mEq/L   Potassium 4.1 3.5 - 5.1 mEq/L   Chloride 109 96 - 112 mEq/L   CO2 26 19 - 32 mEq/L   Glucose, Bld 91 70 - 99 mg/dL   BUN 16 6 - 23 mg/dL   Creatinine, Ser 0.6 0.4 - 1.2 mg/dL   Calcium 8.7 8.4 - 08.6 mg/dL   GFR 578.46 >96.29 mL/min  Hepatic function panel  Result Value Ref Range   Total Bilirubin 0.3 0.3 - 1.2 mg/dL   Bilirubin, Direct 0.0 0.0 - 0.3 mg/dL   Alkaline Phosphatase 80 39 - 117 U/L   AST 19 0 - 37 U/L   ALT 21 0 - 35 U/L   Total Protein 7.0 6.0 - 8.3 g/dL   Albumin 4.0 3.5 - 5.2 g/dL  TSH  Result Value Ref Range   TSH 0.97 0.35 - 5.50 uIU/mL  Lipid panel  Result Value Ref Range   Cholesterol 200 0 - 200 mg/dL   Triglycerides 528.4 0.0 - 149.0 mg/dL   HDL 13.24 >40.10 mg/dL   VLDL 27.2 0.0 - 53.6 mg/dL   LDL Cholesterol 644 (H) 0 - 99 mg/dL   Total CHOL/HDL Ratio 4   HM COLONOSCOPY  Result Value Ref Range   HM Colonoscopy Per pt      EKG/XRAY:   Primary read interpreted by Dr. Conley Rolls at [facility].   ASSESSMENT/PLAN: Encounter Diagnosis  Name Primary?  . Poison ivy dermatitis Yes   Depomedrol steroid  injection given in the office. Advised to take anti-histamines scheduled Advised to use soap and water, calamine lotion If it does not resolve or improve in the next 48 hours and continues to spread  then she can take the Medrol Dosepak. Follow-up as needed  Gross sideeffects, risk and benefits, and alternatives of medications d/w patient. Patient is aware that all medications have potential sideeffects and we are unable to predict every  sideeffect or drug-drug interaction that may occur.  ,  PHUONG, DO 12/04/2014 11:20 AM

## 2014-12-04 NOTE — Patient Instructions (Signed)
Poison Ivy Poison ivy is a inflammation of the skin (contact dermatitis) caused by touching the allergens on the leaves of the ivy plant following previous exposure to the plant. The rash usually appears 48 hours after exposure. The rash is usually bumps (papules) or blisters (vesicles) in a linear pattern. Depending on your own sensitivity, the rash may simply cause redness and itching, or it may also progress to blisters which may break open. These must be well cared for to prevent secondary bacterial (germ) infection, followed by scarring. Keep any open areas dry, clean, dressed, and covered with an antibacterial ointment if needed. The eyes may also get puffy. The puffiness is worst in the morning and gets better as the day progresses. This dermatitis usually heals without scarring, within 2 to 3 weeks without treatment. HOME CARE INSTRUCTIONS  Thoroughly wash with soap and water as soon as you have been exposed to poison ivy. You have about one half hour to remove the plant resin before it will cause the rash. This washing will destroy the oil or antigen on the skin that is causing, or will cause, the rash. Be sure to wash under your fingernails as any plant resin there will continue to spread the rash. Do not rub skin vigorously when washing affected area. Poison ivy cannot spread if no oil from the plant remains on your body. A rash that has progressed to weeping sores will not spread the rash unless you have not washed thoroughly. It is also important to wash any clothes you have been wearing as these may carry active allergens. The rash will return if you wear the unwashed clothing, even several days later. Avoidance of the plant in the future is the best measure. Poison ivy plant can be recognized by the number of leaves. Generally, poison ivy has three leaves with flowering branches on a single stem. Diphenhydramine may be purchased over the counter and used as needed for itching. Do not drive with  this medication if it makes you drowsy.Ask your caregiver about medication for children. SEEK MEDICAL CARE IF:  Open sores develop.  Redness spreads beyond area of rash.  You notice purulent (pus-like) discharge.  You have increased pain.  Other signs of infection develop (such as fever). Document Released: 05/20/2000 Document Revised: 08/15/2011 Document Reviewed: 10/31/2008 Exit[employer] Patient Information 2015 [employer]. This information is not intended to replace advice given to you by your health care provider. Make sure you discuss any questions you have with your health care provider.  

## 2014-12-11 DIAGNOSIS — H5371 Glare sensitivity: Secondary | ICD-10-CM | POA: Diagnosis not present

## 2014-12-11 DIAGNOSIS — H5372 Impaired contrast sensitivity: Secondary | ICD-10-CM | POA: Diagnosis not present

## 2014-12-11 DIAGNOSIS — H3554 Dystrophies primarily involving the retinal pigment epithelium: Secondary | ICD-10-CM | POA: Diagnosis not present

## 2014-12-12 DIAGNOSIS — H3554 Dystrophies primarily involving the retinal pigment epithelium: Secondary | ICD-10-CM | POA: Diagnosis not present

## 2014-12-12 DIAGNOSIS — H5371 Glare sensitivity: Secondary | ICD-10-CM | POA: Diagnosis not present

## 2014-12-12 DIAGNOSIS — H5372 Impaired contrast sensitivity: Secondary | ICD-10-CM | POA: Diagnosis not present

## 2014-12-16 DIAGNOSIS — H5372 Impaired contrast sensitivity: Secondary | ICD-10-CM | POA: Diagnosis not present

## 2014-12-16 DIAGNOSIS — H3554 Dystrophies primarily involving the retinal pigment epithelium: Secondary | ICD-10-CM | POA: Diagnosis not present

## 2014-12-16 DIAGNOSIS — H5371 Glare sensitivity: Secondary | ICD-10-CM | POA: Diagnosis not present

## 2014-12-18 DIAGNOSIS — H3554 Dystrophies primarily involving the retinal pigment epithelium: Secondary | ICD-10-CM | POA: Diagnosis not present

## 2014-12-18 DIAGNOSIS — H5371 Glare sensitivity: Secondary | ICD-10-CM | POA: Diagnosis not present

## 2014-12-18 DIAGNOSIS — H5372 Impaired contrast sensitivity: Secondary | ICD-10-CM | POA: Diagnosis not present

## 2014-12-23 DIAGNOSIS — H3554 Dystrophies primarily involving the retinal pigment epithelium: Secondary | ICD-10-CM | POA: Diagnosis not present

## 2014-12-23 DIAGNOSIS — H5372 Impaired contrast sensitivity: Secondary | ICD-10-CM | POA: Diagnosis not present

## 2014-12-23 DIAGNOSIS — H5371 Glare sensitivity: Secondary | ICD-10-CM | POA: Diagnosis not present

## 2014-12-25 DIAGNOSIS — H5372 Impaired contrast sensitivity: Secondary | ICD-10-CM | POA: Diagnosis not present

## 2014-12-25 DIAGNOSIS — H5371 Glare sensitivity: Secondary | ICD-10-CM | POA: Diagnosis not present

## 2014-12-25 DIAGNOSIS — H3554 Dystrophies primarily involving the retinal pigment epithelium: Secondary | ICD-10-CM | POA: Diagnosis not present

## 2014-12-29 DIAGNOSIS — H5372 Impaired contrast sensitivity: Secondary | ICD-10-CM | POA: Diagnosis not present

## 2014-12-29 DIAGNOSIS — H3554 Dystrophies primarily involving the retinal pigment epithelium: Secondary | ICD-10-CM | POA: Diagnosis not present

## 2014-12-29 DIAGNOSIS — H5371 Glare sensitivity: Secondary | ICD-10-CM | POA: Diagnosis not present

## 2015-01-01 DIAGNOSIS — H5371 Glare sensitivity: Secondary | ICD-10-CM | POA: Diagnosis not present

## 2015-01-01 DIAGNOSIS — H3554 Dystrophies primarily involving the retinal pigment epithelium: Secondary | ICD-10-CM | POA: Diagnosis not present

## 2015-01-01 DIAGNOSIS — H5372 Impaired contrast sensitivity: Secondary | ICD-10-CM | POA: Diagnosis not present

## 2015-01-05 DIAGNOSIS — H5372 Impaired contrast sensitivity: Secondary | ICD-10-CM | POA: Diagnosis not present

## 2015-01-05 DIAGNOSIS — H5371 Glare sensitivity: Secondary | ICD-10-CM | POA: Diagnosis not present

## 2015-01-05 DIAGNOSIS — H3554 Dystrophies primarily involving the retinal pigment epithelium: Secondary | ICD-10-CM | POA: Diagnosis not present

## 2015-01-07 DIAGNOSIS — H5372 Impaired contrast sensitivity: Secondary | ICD-10-CM | POA: Diagnosis not present

## 2015-01-07 DIAGNOSIS — H3554 Dystrophies primarily involving the retinal pigment epithelium: Secondary | ICD-10-CM | POA: Diagnosis not present

## 2015-01-07 DIAGNOSIS — H5371 Glare sensitivity: Secondary | ICD-10-CM | POA: Diagnosis not present

## 2015-01-19 DIAGNOSIS — H5372 Impaired contrast sensitivity: Secondary | ICD-10-CM | POA: Diagnosis not present

## 2015-01-19 DIAGNOSIS — H3554 Dystrophies primarily involving the retinal pigment epithelium: Secondary | ICD-10-CM | POA: Diagnosis not present

## 2015-01-19 DIAGNOSIS — H5371 Glare sensitivity: Secondary | ICD-10-CM | POA: Diagnosis not present

## 2015-02-13 DIAGNOSIS — H3554 Dystrophies primarily involving the retinal pigment epithelium: Secondary | ICD-10-CM | POA: Diagnosis not present

## 2015-02-13 DIAGNOSIS — H4321 Crystalline deposits in vitreous body, right eye: Secondary | ICD-10-CM | POA: Diagnosis not present

## 2015-03-17 DIAGNOSIS — E890 Postprocedural hypothyroidism: Secondary | ICD-10-CM | POA: Diagnosis not present

## 2015-05-11 ENCOUNTER — Ambulatory Visit (INDEPENDENT_AMBULATORY_CARE_PROVIDER_SITE_OTHER): Payer: Medicare Other | Admitting: Neurology

## 2015-05-11 ENCOUNTER — Encounter: Payer: Self-pay | Admitting: Neurology

## 2015-05-11 ENCOUNTER — Encounter (INDEPENDENT_AMBULATORY_CARE_PROVIDER_SITE_OTHER): Payer: Self-pay

## 2015-05-11 VITALS — BP 92/60 | HR 78 | Resp 20 | Ht 63.0 in | Wt 213.0 lb

## 2015-05-11 DIAGNOSIS — J209 Acute bronchitis, unspecified: Secondary | ICD-10-CM | POA: Diagnosis not present

## 2015-05-11 DIAGNOSIS — G4710 Hypersomnia, unspecified: Secondary | ICD-10-CM | POA: Diagnosis not present

## 2015-05-11 DIAGNOSIS — G4730 Sleep apnea, unspecified: Secondary | ICD-10-CM

## 2015-05-11 DIAGNOSIS — G4720 Circadian rhythm sleep disorder, unspecified type: Secondary | ICD-10-CM | POA: Diagnosis not present

## 2015-05-11 NOTE — Progress Notes (Signed)
[facility]   Provider:  Melvyn Novas, M D  Referring Provider: Sheliah Hatch, MD Primary Care Physician:  Neena Rhymes, MD  Chief Complaint  Patient presents with  . New Patient (Initial Visit)    extreme daytime fatigue, referred by Dr. Emerson Monte, rm 10, alone    HPI:  Yolanda Reeves is a 69 y.o. female , seen here as a referral/ revisit  from Dr. Nolen Mu. She has seen Dr Terrace Arabia for a  stroke evaluation , an MRI follow up in 2008. At the time, her name was Yolanda Reeves.   Chief complaint according to patient : " I have gained so much weight, and feel fatigue over 12 years, progressively "   Mrs. Raso reports that she had purchased a couch for her office space filled that she could take a lunch breaks in combination with a nap. She states that she is taking a nap now every day and feels that she needs it. She has an irresistible urge to go to sleep. This is independent of the nighttime sleep quality. She reports that even after a good night of 8 hours of sleep she will still be excessively daytime sleepy the following day. She has the comorbidities of obesity, thyroid disease, hypo-tension, and is status post hysterectomy at age 89. she endorsed 13 points on the Epworth sleepiness score, 6 points on her geriatric depression scale and 40 points on the fatigue severity scale.  Dr. Nolen Mu treats her for bipolar depression. 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     Sleep habits are as follows: The patient goes to bed anytime between 7 PM and 2 AM, she is now no longer working outside the home and this has caused her not to have to adhere to a sleep time. She often yawns throughout the afternoon but this can even happen in the mornings. Many nights it will take her on a couple of minutes to fall asleep but she also has experienced night were it took hours. She usually sleeps until 7 AM, her 3 dogs will  wake her. One time nocturia, at 3 AM.  She dreams , but not vividly. She has been used to worry a lot, and used to have dreams. In the past these dreams often felt very real. There would have repetitively the same content. She also reports recurrence sleep paralysis. He does not report any dream intrusion into wakefulness. She does not report symptoms of cataplexy. Occasionally she will wake up with a headache usually , not with a dry mouth. She is a prone sleeper, sleeps with 3 pillows. The dogs are in bed with her. The bedroom is cool  and dark, she needs a background sound to overpower her tinnitus. She has never been told she snores, or has apnea. No gasping.  Index have never woken her but are present in the morning and affect the occipital region back up the nape of her neck mostly. They usually resolve as the day goes on.   Sleep medical history and family sleep history:  Raised by a great aunt, no history.    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 Smoker- for 50 years , quit 3 years ago. Used vapor. No ETOH, Caffeine ; 2-4 mugs .   Review of Systems: Out of a complete 14 system review, the patient complains of only the  following symptoms, and all other reviewed systems are negative. The patient endorsed weight gain, fatigue, heart murmur, hearing loss and tinnitus, she has best disease with a progressive loss of vision, shortness of breath, coughing, wheezing, joint pain and joint swelling aching muscles, incontinence for urine, memory loss sometimes, headaches, sleepiness, decreased energy anxiety.  Epworth score 13 , Fatigue severity score 40  , depression score 6.   Social History   Social History  . Marital Status: Divorced    Spouse Name: N/A  . Number of Children: N/A  . Years of Education: 14   Occupational History  . Not on file.   Social History Main Topics  . Smoking status: Former Smoker -- 1.00 packs/day for 50 years     Types: Cigarettes    Quit date: 02/05/2011  . Smokeless tobacco: Not on file  . Alcohol Use: 0.0 oz/week    0 Standard drinks or equivalent per week     Comment: rarely  . Drug Use: No  . Sexual Activity: Not on file   Other Topics Concern  . Not on file   Social History Narrative   Drinks 3-5 cups of caffeine daily.    Family History  Problem Relation Age of Onset  . Cancer Brother     thyroid  . Prostate cancer Brother   . Skin cancer Sister   . Colon cancer Paternal Grandmother   . Thyroid cancer Brother     Past Medical History  Diagnosis Date  . WART, RIGHT HAND   . HYPOTHYROIDISM, POST-RADIOACTIVE IODINE   . VITAMIN D DEFICIENCY   . HYPERLIPIDEMIA   . TOBACCO USE   . UNSPECIFIED SUBJECTIVE VISUAL DISTURBANCE   . OSTEOPENIA   . Chest pain, atypical   . Best dystrophy 2012  . Heart murmur     Past Surgical History  Procedure Laterality Date  . Abdominal hysterectomy    . Laparoscopic oopherectomy    . Carpal tunnel release    . Tonsillectomy      Current Outpatient Prescriptions  Medication Sig Dispense Refill  . diazepam (VALIUM) 5 MG tablet Take 5 mg by mouth daily.    . ergocalciferol (VITAMIN D2) 50000 UNITS capsule Take 50,000 Units by mouth once a week.    . lamoTRIgine (LAMICTAL) 150 MG tablet Take 150 mg by mouth daily.    Marland Kitchen levothyroxine (SYNTHROID, LEVOTHROID) 125 MCG tablet 125 mcg.    . sertraline (ZOLOFT) 100 MG tablet Take 100 mg by mouth daily.    Marland Kitchen zolpidem (AMBIEN) 10 MG tablet Take 10 mg by mouth at bedtime as needed.     No current facility-administered medications for this visit.    Allergies as of 05/11/2015  . (No Known Allergies)    Vitals: BP 92/60 mmHg  Pulse 78  Resp 20  Ht 5\' 3"  (1.6 m)  Wt 213 lb (96.616 kg)  BMI 37.74 kg/m2 Last Weight:  Wt Readings from Last 1 Encounters:  05/11/15 213 lb (96.616 kg)   UUV:OZDG mass index is 37.74 kg/(m^2).     Last Height:   Ht Readings from Last 1 Encounters:  05/11/15  5\' 3"  (1.6 m)    Physical exam:  General: The patient is awake, alert and appears not in acute distress. The patient is well groomed. Head: Normocephalic, atraumatic. Neck is supple. Mallampati 3,  Dentures upper.  neck circumference: 13 . Nasal airflow unrestricted , TMJ is  not evident . Retrognathia is not seen.  Cardiovascular:  Regular rate  and rhythm, without  murmurs or carotid bruit, and without distended neck veins. Respiratory: Lungs are clear to auscultation. Skin:  Without evidence of edema, or rash Trunk: BMI is elevated . The patient's posture is erect  Neurologic exam : The patient is awake and alert, oriented to place and time.   Memory subjective  described as intact.  Attention span & concentration ability appears normal.  Speech is fluent,  without  dysarthria, dysphonia or aphasia.  Mood and affect are appropriate.  Cranial nerves: Pupils are equal and briskly reactive to light. Funduscopic exam without  evidence of pallor or edema. Extraocular movements  in vertical and horizontal planes intact and without nystagmus. Visual fields by finger perimetry are intact. Hearing to finger rub intact.   Facial sensation intact to fine touch.  Facial motor strength is symmetric and tongue and uvula move midline. Shoulder shrug was symmetrical.   Motor exam:  Normal tone, muscle bulk and symmetric strength in all extremities.  Sensory:  Fine touch, pinprick and vibration were tested in all extremities. Proprioception tested in the upper extremities was normal.  Coordination: Rapid alternating movements in the fingers/hands was normal.   Finger-to-nose maneuver  normal without evidence of ataxia, dysmetria or tremor.  Gait and station: Patient walks without assistive device and is able unassisted to climb up to the exam table. Strength within normal limits.  Stance is stable and normal.   Deep tendon reflexes: in the  upper and lower extremities are symmetric and intact.  Babinski maneuver response is downgoing.  The patient was advised of the nature of the diagnosed sleep disorder , the treatment options and risks for general a health and wellness arising from not treating the condition.  I spent more than 40 minutes of face to face time with the patient. Greater than 50% of time was spent in counseling and coordination of care. We have discussed the diagnosis and differential and I answered the patient's questions.     Assessment:  After physical and neurologic examination, review of laboratory studies,  Personal review of imaging studies, reports of other /same  Imaging studies ,  Results of polysomnography/ neurophysiology testing and pre-existing records as far as provided in visit., my assessment is   1) the patient has no human witnesses to her sleep pattern but it seems that she does have some circadian problems initiating and maintaining sleep on a regular basis at a routine time. I would like for her to work and establish a sleep time.  2) she may be a snorer and she may have apnea there is nobody to confirm it. She has experienced significant weight gain over the last 5 years and she has had an increasing fatigue feeling probably already over the last 12 years. That she also wakes up with a headache could be an indication for underlying apnea or hypoventilation. For this reason and for the very high degree of sleepiness and fatigue, I will order a split night polysomnography.   3) the patient is treated for bipolar depression by Dr. Nolen MuMcKinney, she endorsed today a slight elevated depression score. Untreated depression as well as hypothyroidism, hypertension, vitamin D deficiency can all increase the level of fatigue. I would in general I asked all my patients to take a multivitamin this 2000 units of vitamin D and with a 400 g dose of vitamin B12.    Plan:  Treatment plan and additional workup :   See me after sleep study for follow up.  Porfirio Mylar  Zell Doucette MD  05/11/2015   CC:  Andee Poles. MD   Sheliah Hatch, Md [address] [place], [place] [address]

## 2015-05-11 NOTE — Patient Instructions (Signed)

## 2015-06-15 ENCOUNTER — Encounter: Payer: Medicare Other | Admitting: Neurology

## 2015-06-23 ENCOUNTER — Ambulatory Visit (INDEPENDENT_AMBULATORY_CARE_PROVIDER_SITE_OTHER): Payer: Medicare Other | Admitting: Neurology

## 2015-06-23 DIAGNOSIS — G4710 Hypersomnia, unspecified: Secondary | ICD-10-CM

## 2015-06-23 DIAGNOSIS — J209 Acute bronchitis, unspecified: Secondary | ICD-10-CM

## 2015-06-23 DIAGNOSIS — G4730 Sleep apnea, unspecified: Principal | ICD-10-CM

## 2015-06-23 DIAGNOSIS — G4720 Circadian rhythm sleep disorder, unspecified type: Secondary | ICD-10-CM

## 2015-06-24 NOTE — Sleep Study (Signed)
Please see the scanned sleep study interpretation located in the procedure tab in the chart view section.  

## 2015-06-30 ENCOUNTER — Telehealth: Payer: Self-pay

## 2015-06-30 DIAGNOSIS — R0902 Hypoxemia: Secondary | ICD-10-CM

## 2015-06-30 NOTE — Telephone Encounter (Signed)
Spoke to pt regarding her sleep study results. I advised her that her sleep study did not reveal significant sleep apnea or significant PLMS resulting in significant sleep disruption. Her sleep study did show prolonged hypoxemia and Dr. Vickey Huger advised to have pt evaluated for hypoxemia by pulmonology and they may consider starting o2 at night. Pt was advised to lose weight, diet, and exercise if not contraindicated by other physicians. Pt was advised to avoid driving or operating hazardous machinery when sleepy. Pt wanted a referral to a pulmonologist. She also wants an appt with Dr. Vickey Huger to discuss her sleep study results further. Appt made for 1/26 at 8:30. Pt verbalized understanding.

## 2015-06-30 NOTE — Telephone Encounter (Signed)
Called to discuss sleep study results. No answer, left a message asking her to call me back. 

## 2015-07-02 ENCOUNTER — Encounter: Payer: Self-pay | Admitting: Neurology

## 2015-07-02 ENCOUNTER — Ambulatory Visit (INDEPENDENT_AMBULATORY_CARE_PROVIDER_SITE_OTHER): Payer: Medicare Other | Admitting: Neurology

## 2015-07-02 VITALS — BP 118/70 | HR 80 | Resp 20 | Ht 62.5 in | Wt 214.0 lb

## 2015-07-02 DIAGNOSIS — R5383 Other fatigue: Secondary | ICD-10-CM | POA: Diagnosis not present

## 2015-07-02 DIAGNOSIS — G4761 Periodic limb movement disorder: Secondary | ICD-10-CM

## 2015-07-02 NOTE — Patient Instructions (Signed)
Hypoxemia °Hypoxemia occurs when your blood does not contain enough oxygen. The body cannot work well when it does not have enough oxygen because every part of your body needs oxygen. Oxygen travels to all parts of the body through your blood. Hypoxemia can develop suddenly or can come on slowly. °CAUSES °Some common causes of hypoxemia include: °· Long-term (chronic) lung diseases, such as chronic obstructive pulmonary disease (COPD) or interstitial lung disease.  °· Disorders that affect breathing at night, such as sleep apnea. °· Fluid buildup in your lungs (pulmonary edema).  °· Lung infection (pneumonia). °· Lung or throat cancer. °· Abnormal blood flow that bypasses the lungs (shunt). °· Certain diseases that affect nerves or muscles. °· A collapsed lung (pneumothorax). °· A blood clot in the lungs (pulmonary embolus). °· Certain types of heart disease. °· Slow or shallow breathing (hypoventilation).  °· Certain medicines. °· High altitudes. °· Toxic chemicals and gases. °SIGNS AND SYMPTOMS °Not everyone who has hypoxemia will develop symptoms. If the hypoxemia developed quickly, you will likely have symptoms such as shortness of breath. If the hypoxemia came on slowly over months or years, you may not notice any symptoms. Symptoms can include: °· Shortness of breath (dyspnea). °· Bluish color of the skin, lips, or nail beds. °· Breathing that is fast, noisy, or shallow. °· A fast heartbeat. °· Feeling tired or sleepy. °· Being confused or feeling anxious. °DIAGNOSIS °To determine if you have hypoxemia, your health care provider may perform: °· A physical exam. °· Blood tests. °· A pulse oximetry. A sensor will be put on your finger, toe, or earlobe to measure the percent of oxygen in your blood. °TREATMENT °You will likely be treated with oxygen therapy. Depending on the cause of your hypoxemia, you may need oxygen for a short time (weeks or months), or you may need it indefinitely. Your health care provider  may also recommend other therapies to treat the underlying cause of your hypoxemia. °HOME CARE INSTRUCTIONS °· Only take over-the-counter or prescription medicines as directed by your health care provider. °· Follow oxygen safety measures if you are on oxygen therapy. These may include: °¨ Always having a backup supply of oxygen. °¨ Not allowing anyone to smoke around oxygen. °¨ Handling the oxygen tanks carefully and as instructed. °· If you smoke, quit. Stay away from people who smoke. °· Follow up with your health care provider as directed. °SEEK MEDICAL CARE IF: °· You have any concerns about your oxygen therapy. °· You still have trouble breathing. °· You become short of breath when you exercise. °· You are tired when you wake up. °· You have a headache when you wake up. °SEEK IMMEDIATE MEDICAL CARE IF:  °· Your breathing gets worse. °· You have new shortness of breath with normal activity. °· You have a bluish color of the skin, lips, or nail beds. °· You have confusion or cloudy thinking. °· You cough up dark mucus. °· You have chest pain. °· You have a fever. °MAKE SURE YOU: °· Understand these instructions. °· Will watch your condition. °· Will get help right away if you are not doing well or get worse. °  °This information is not intended to replace advice given to you by your health care provider. Make sure you discuss any questions you have with your health care provider. °  °Document Released: 12/06/2010 Document Revised: 05/28/2013 Document Reviewed: 12/20/2012 °[employer] Interactive Patient Education ©2016 [employer] ° °

## 2015-07-02 NOTE — Progress Notes (Signed)
S[facility]EP [facility]INE [facility]   Provider:  Melvyn Novas, M D  Referring Provider: Sheliah Hatch, MD Primary Care Physician:  Neena Rhymes, MD  Chief Complaint  Patient presents with  . Follow-up    discuss sleep study results, pulmonologist consult placed, seeing Dr. Delton Coombes 3/6 for hypoxemia, rm 11, alone    HPI:  Yolanda Reeves is a 70 y.o. female , seen here as a referral/ revisit  from Dr. Nolen Mu. She has seen Dr Terrace Arabia for a  stroke evaluation , an MRI follow up in 2008. At the time, her name was Yolanda Reeves.   Chief complaint according to patient : " I have gained so much weight, and feel fatigued over 12 years, progressively "  Yolanda Reeves reports that she had purchased a couch for her office space filled that she could take a lunch breaks in combination with a nap. She states that she is taking a nap now every day and feels that she needs it. She has an irresistible urge to go to sleep. This is independent of the nighttime sleep quality. She reports that even after a good night of 8 hours of sleep she will still be excessively daytime sleepy the following day. She has the comorbidities of obesity, thyroid disease, hypo-tension, and is status post hysterectomy at age 81. she endorsed 13 points on the Epworth sleepiness score, 6 points on her geriatric depression scale and 40 points on the fatigue severity scale.  Dr. Nolen Mu treats her for bipolar depression. 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    Sleep habits are as follows: The patient goes to bed anytime between 7 PM and 2 AM, she is now no longer working outside the home and this has caused her not to have to adhere to a sleep time. She often yawns throughout the afternoon but this can even happen in the mornings. Many nights it will take her on a couple of minutes to fall asleep but she also has experienced night were it took hours. She usually sleeps until 7 AM, her  3 dogs will wake her. One time nocturia, at 3 AM.  She dreams , but not vividly. She has been used to worry a lot, and used to have dreams. In the past these dreams often felt very real. There would have repetitively the same content. She also reports recurrence sleep paralysis. He does not report any dream intrusion into wakefulness. She does not report symptoms of cataplexy. Occasionally she will wake up with a headache usually , not with a dry mouth. She is a prone sleeper, sleeps with 3 pillows. The dogs are in bed with her. The bedroom is cool  and dark, she needs a background sound to overpower her tinnitus. She has never been told she snores, or has apnea. No gasping. HA have never woken her but are present in the morning and affect the occipital region back up the nape of her neck . They usually resolve as the day goes on.  Sleep medical history and family sleep history: Raised by a great aunt, no history.  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 Smoker- for 50 years , quit 3 years ago. Used vapor. No ETOH, Caffeine ; 2-4 mugs .  07-02-2015 Yolanda Reeves returns today for follow-up after her recent sleep study, performed on 06/23/2015. She had no apnea noted. Her AHI  was 0.1 she frequently moves but she never woke up from movement her total number of periodic limb movements was 445 minutes. Her oxygen saturation stayed low for the whole study. A former smoker this may be an overlapping condition of COPD. However she has no oxygen at home , has not been diagnosed with low saturation before. She added that she just overcame a bronchitis at the time of the study- this may be the explanation.  I will order a ONO on room air to confirm that this has resolved. She has a history of rheumatic fever as a child  ( heart Murmur )and now has sleep onset headache.    Review of Systems: Out of a complete 14 system review, the patient complains of only the following  symptoms, and all other reviewed systems are negative. The patient endorsed weight gain, fatigue, heart murmur, hearing loss and tinnitus, she has best disease with a progressive loss of vision, shortness of breath, coughing, wheezing, joint pain and joint swelling aching muscles, incontinence for urine, memory loss sometimes, headaches, sleepiness, decreased energy anxiety.  Epworth score  13 , as last visit 13 , Fatigue severity score 40 from 61 pre study  , depression score 6, elevated.   Social History   Social History  . Marital Status: Divorced    Spouse Name: N/A  . Number of Children: N/A  . Years of Education: 14   Occupational History  . Not on file.   Social History Main Topics  . Smoking status: Former Smoker -- 1.00 packs/day for 50 years    Types: Cigarettes    Quit date: 02/05/2011  . Smokeless tobacco: Not on file  . Alcohol Use: 0.0 oz/week    0 Standard drinks or equivalent per week     Comment: rarely  . Drug Use: No  . Sexual Activity: Not on file   Other Topics Concern  . Not on file   Social History Narrative   Drinks 3-5 cups of caffeine daily.    Family History  Problem Relation Age of Onset  . Cancer Brother     thyroid  . Prostate cancer Brother   . Skin cancer Sister   . Colon cancer Paternal Grandmother   . Thyroid cancer Brother     Past Medical History  Diagnosis Date  . WART, RIGHT HAND   . HYPOTHYROIDISM, POST-RADIOACTIVE IODINE   . VITAMIN D DEFICIENCY   . HYPERLIPIDEMIA   . TOBACCO USE   . UNSPECIFIED SUBJECTIVE VISUAL DISTURBANCE   . OSTEOPENIA   . Chest pain, atypical   . Best dystrophy 2012  . Heart murmur     Past Surgical History  Procedure Laterality Date  . Abdominal hysterectomy    . Laparoscopic oopherectomy    . Carpal tunnel release    . Tonsillectomy      Current Outpatient Prescriptions  Medication Sig Dispense Refill  . diazepam (VALIUM) 5 MG tablet Take 5 mg by mouth daily.    . ergocalciferol  (VITAMIN D2) 50000 UNITS capsule Take 50,000 Units by mouth once a week.    . lamoTRIgine (LAMICTAL) 150 MG tablet Take 150 mg by mouth daily.    Marland Kitchen levothyroxine (SYNTHROID, LEVOTHROID) 125 MCG tablet 125 mcg.    . sertraline (ZOLOFT) 100 MG tablet Take 100 mg by mouth daily.    Marland Kitchen zolpidem (AMBIEN) 10 MG tablet Take 10 mg by mouth at bedtime as needed.     No current facility-administered medications for this visit.  Allergies as of 07/02/2015  . (No Known Allergies)    Vitals: BP 118/70 mmHg  Pulse 80  Resp 20  Ht 5' 2.5" (1.588 m)  Wt 214 lb (97.07 kg)  BMI 38.49 kg/m2 Last Weight:  Wt Readings from Last 1 Encounters:  07/02/15 214 lb (97.07 kg)   AVW:UJWJ mass index is 38.49 kg/(m^2).     Last Height:   Ht Readings from Last 1 Encounters:  07/02/15 5' 2.5" (1.588 m)    Physical exam:  General: The patient is awake, alert and appears not in acute distress. The patient is well groomed. Head: Normocephalic, atraumatic. Neck is supple. Mallampati 3,  Dentures upper.  neck circumference: 13 . Nasal airflow unrestricted , TMJ is  not evident . Retrognathia is not seen.  Cardiovascular:  Regular rate and rhythm, without  murmurs or carotid bruit, and without distended neck veins. Respiratory: Lungs are clear to auscultation. Skin:  Without evidence of edema, or rash Trunk: BMI is elevated . The patient's posture is erect  Neurologic exam : The patient is awake and alert, oriented to place and time.   Memory subjective  described as intact.  Attention span & concentration ability appears normal.  Speech is fluent,  without  dysarthria, dysphonia or aphasia.  Mood and affect are appropriate.  Cranial nerves: Pupils are equal and briskly reactive to light. Funduscopic exam without  evidence of pallor or edema. Extraocular movements  in vertical and horizontal planes intact and without nystagmus. Visual fields by finger perimetry are intact. Hearing to finger rub intact.    Facial sensation intact to fine touch.  Facial motor strength is symmetric and tongue and uvula move midline. Shoulder shrug was symmetrical.  Deep tendon reflexes: in the  upper and lower extremities are symmetric and intact. Babinski maneuver response is downgoing.  The patient was advised of the nature of the diagnosed sleep disorder, the treatment options and risks for general a health and wellness arising from not treating the condition.  I spent more than 15 minutes of face to face time with the patient. Greater than 50% of time was spent in counseling and coordination of care. We have discussed the diagnosis and differential and I answered the patient's questions.     Assessment:  After physical and neurologic examination, review of laboratory studies,  Personal review of imaging studies, reports of other /same  Imaging studies,  Results of polysomnography/ neurophysiology testing and pre-existing records as far as provided in visit., my assessment is   1)  the patient has no human witnesses to her sleep pattern but it seems that she does have some circadian problems initiating and maintaining sleep on a regular basis at a routine time. I would like for her to work and establish a sleep time. Cut down on caffeine if you have insomnia, I gave her my brochure; Your  guide to healthy sleep   2)  No apnea found. she may be a snorer .That she also wakes up with a headache could be an indication for underlying oxygen desaturation or CO2 retention- the low oxygen was confirmed in the sleep study but she suffered from bronchitis at the time. Confirm the oxygen resolution with an ONO.   3) PLM - frequent but not leading to arousals. NO other organic reason for d fatigue and sleepiness.  4) the patient is treated for bipolar depression by Dr. Nolen Mu, she endorsed today a slight elevated depression score. Untreated depression as well as hypothyroidism, hypertension, vitamin  D deficiency can all  increase the level of fatigue. I would in general I asked all my patients to take a multivitamin this 2000 units of vitamin D and with a 400 mg dose of vitamin B12.   Plan:  Treatment plan and additional workup : See me after sleep study for follow up.   Porfirio Mylar Marquinn Meschke MD  07/02/2015   CC:  Andee Poles. MD   Sheliah Hatch, Md [address] [place], [place] [address]

## 2015-07-09 DIAGNOSIS — B354 Tinea corporis: Secondary | ICD-10-CM | POA: Diagnosis not present

## 2015-07-29 ENCOUNTER — Encounter (INDEPENDENT_AMBULATORY_CARE_PROVIDER_SITE_OTHER): Payer: Self-pay | Admitting: Adult Health

## 2015-07-29 ENCOUNTER — Other Ambulatory Visit: Payer: Self-pay

## 2015-07-29 ENCOUNTER — Telehealth: Payer: Self-pay

## 2015-07-29 DIAGNOSIS — Z0289 Encounter for other administrative examinations: Secondary | ICD-10-CM

## 2015-07-29 NOTE — Telephone Encounter (Signed)
Pt came for office visit today with Yolanda Millet, NP but the ONO Dr. Vickey Huger ordered has not been performed. Sent order to [facility] to be performed.

## 2015-07-29 NOTE — Progress Notes (Signed)
This encounter was created in error - please disregard.

## 2015-08-03 DIAGNOSIS — R5383 Other fatigue: Secondary | ICD-10-CM | POA: Diagnosis not present

## 2015-08-05 ENCOUNTER — Telehealth: Payer: Self-pay | Admitting: *Deleted

## 2015-08-05 ENCOUNTER — Encounter: Payer: Self-pay | Admitting: *Deleted

## 2015-08-05 NOTE — Telephone Encounter (Signed)
Pre-Visit Call completed with patient and chart updated.   Pre-Visit Info documented in Specialty Comments under [employer].    

## 2015-08-06 ENCOUNTER — Ambulatory Visit (INDEPENDENT_AMBULATORY_CARE_PROVIDER_SITE_OTHER): Payer: Medicare Other | Admitting: Family Medicine

## 2015-08-06 ENCOUNTER — Encounter: Payer: Self-pay | Admitting: Family Medicine

## 2015-08-06 VITALS — BP 112/58 | HR 71 | Temp 97.9°F | Ht 62.5 in | Wt 216.8 lb

## 2015-08-06 DIAGNOSIS — Z23 Encounter for immunization: Secondary | ICD-10-CM | POA: Diagnosis not present

## 2015-08-06 DIAGNOSIS — M8580 Other specified disorders of bone density and structure, unspecified site: Secondary | ICD-10-CM

## 2015-08-06 DIAGNOSIS — Z119 Encounter for screening for infectious and parasitic diseases, unspecified: Secondary | ICD-10-CM | POA: Diagnosis not present

## 2015-08-06 DIAGNOSIS — Z13220 Encounter for screening for lipoid disorders: Secondary | ICD-10-CM | POA: Diagnosis not present

## 2015-08-06 DIAGNOSIS — Z0000 Encounter for general adult medical examination without abnormal findings: Secondary | ICD-10-CM | POA: Diagnosis not present

## 2015-08-06 DIAGNOSIS — Z130 Encounter for screening for diseases of the blood and blood-forming organs and certain disorders involving the immune mechanism: Secondary | ICD-10-CM

## 2015-08-06 DIAGNOSIS — E669 Obesity, unspecified: Secondary | ICD-10-CM | POA: Diagnosis not present

## 2015-08-06 DIAGNOSIS — Z131 Encounter for screening for diabetes mellitus: Secondary | ICD-10-CM

## 2015-08-06 NOTE — Progress Notes (Signed)
[facility] [address] [place], [place] [address] [phone]  Date:  08/06/2015   Name:  Yolanda Reeves   DOB:  02-Feb-1946   MRN:  [id number]  PCP:  Abbe Amsterdam, MD    Chief Complaint: Annual Exam   History of Present Illness:  Yolanda Reeves is a 70 y.o. very pleasant female patient who presents with the following:  History of HTN, hypothyroidism, obesity, osteopenia.  Here today for a CPE Last labs in computer from 2014  She notes that "I'm going blind, my hearing is going on me, I have gained a ton of weight."  She has complaint of being tired all the time.  She sleeps all night but feels tired by mid morning. She did have a sleep study per [facility] neuro. This is still in progress but it seems that although she had low oxygen at night but she does not have OSA.  She is going to see pulmonology and may have nighttime O2 eventually  She is fasting today for labs.   Dr. Allyne Gee is her retinal specialist. Her optho is Dr. Sherryle Lis Dr Nolen Mu is her psychiatrist    Mammo: a few years ago, she plans to set this up herself soon Colonoscopy 2006- she does not wish to do this again.  She had polyps but has decided to decline any further screening Flu shot is UTD Tetanus UTD Zostavax: no record She had pneumovax but needs prevnar.  She would like to do this today Pap: s/p hysterectomy.  She was told that she did not need to continue to have paps.    Her thyroid has been managed by Dr. Talmage Nap- last TSH about 2 months ago. Pt reports that per Dr. Talmage Nap she will no longer need to follow-up with Dr. Talmage Nap and that we can manage her TSH for her  Patient Active Problem List   Diagnosis Date Noted  . Obesity (BMI 30-39.9) 05/19/2013  . Elevated BP 08/31/2012  . Routine general medical examination at a health care facility 08/30/2012  . Chest pain, atypical 04/02/2012  . HYPERLIPIDEMIA 01/16/2009  . VITAMIN D DEFICIENCY  01/05/2009  . UNSPECIFIED SUBJECTIVE VISUAL DISTURBANCE 01/05/2009  . WART, RIGHT HAND 04/02/2008  . HYPOTHYROIDISM, POST-RADIOACTIVE IODINE 04/02/2008  . TOBACCO USE 04/02/2008  . OSTEOPENIA 04/02/2008    Past Medical History  Diagnosis Date  . WART, RIGHT HAND   . HYPOTHYROIDISM, POST-RADIOACTIVE IODINE   . VITAMIN D DEFICIENCY   . HYPERLIPIDEMIA   . TOBACCO USE   . UNSPECIFIED SUBJECTIVE VISUAL DISTURBANCE   . OSTEOPENIA   . Chest pain, atypical   . Best dystrophy 2012  . Heart murmur   . Sleep apnea     Past Surgical History  Procedure Laterality Date  . Abdominal hysterectomy    . Laparoscopic oopherectomy    . Carpal tunnel release    . Tonsillectomy      Social History  Substance Use Topics  . Smoking status: Former Smoker -- 1.00 packs/day for 50 years    Types: Cigarettes    Quit date: 02/05/2011  . Smokeless tobacco: None  . Alcohol Use: 0.0 oz/week    0 Standard drinks or equivalent per week     Comment: rarely    Family History  Problem Relation Age of Onset  . Cancer Brother     thyroid  . Prostate cancer Brother   . Skin cancer Sister   .  Colon cancer Paternal Grandmother   . Thyroid cancer Brother     No Known Allergies  Medication list has been reviewed and updated.  Current Outpatient Prescriptions on File Prior to Visit  Medication Sig Dispense Refill  . diazepam (VALIUM) 5 MG tablet Take 5 mg by mouth daily.    . ergocalciferol (VITAMIN D2) 50000 UNITS capsule Take 50,000 Units by mouth once a week. Reported on 08/05/2015    . ketoconazole (NIZORAL) 2 % cream Apply 1 application topically. For 2 weeks  0  . lamoTRIgine (LAMICTAL) 150 MG tablet Take 150 mg by mouth daily.    Marland Kitchen levothyroxine (SYNTHROID, LEVOTHROID) 125 MCG tablet 125 mcg.    . sertraline (ZOLOFT) 100 MG tablet Take 100 mg by mouth daily.    Marland Kitchen zolpidem (AMBIEN) 10 MG tablet Take 10 mg by mouth at bedtime as needed.     No current facility-administered medications on file  prior to visit.    Review of Systems:  As per HPI- otherwise negative.   Physical Examination: Filed Vitals:   08/06/15 1314  Pulse: 71  Temp: 97.9 F (36.6 C)   Filed Vitals:   08/06/15 1314  Height: 5' 2.5" (1.588 m)  Weight: 216 lb 12.8 oz (98.34 kg)   Body mass index is 39 kg/(m^2). Ideal Body Weight: Weight in (lb) to have BMI = 25: 138.6  GEN: WDWN, NAD, Non-toxic, A & O x 3, obese, looks well HEENT: Atraumatic, Normocephalic. Neck supple. No masses, No LAD.  Bilateral TM wnl, oropharynx normal.  PEERL,EOMI.   Ears and Nose: No external deformity. CV: RRR, No M/G/R. No JVD. No thrill. No extra heart sounds. PULM: CTA B, no wheezes, crackles, rhonchi. No retractions. No resp. distress. No accessory muscle use. ABD: S, NT, ND. No rebound. No HSM. EXTR: No c/c/e NEURO Normal gait.  PSYCH: Normally interactive. Conversant. Not depressed or anxious appearing.  Calm demeanor.  Breast: normal exam, no masses/ dimpling/ discharge    Assessment and Plan: Physical exam  Screening for hyperlipidemia - Plan: Lipid panel  Screening for diabetes mellitus - Plan: Comprehensive metabolic panel, Hemoglobin A1c  Immunization due - Plan: Pneumococcal conjugate vaccine 13-valent IM  Obesity, unspecified - Plan: Lipid panel, Hemoglobin A1c  Screening for deficiency anemia - Plan: CBC  Screening examination for infectious disease - Plan: Hepatitis C antibody  Osteopenia - Plan: DG Bone Density, CANCELED: DG Bone Density  Arrange dexa scanning Labs as above She declines coloscopy Will plan further follow- up pending labs.   Signed Abbe Amsterdam, MD

## 2015-08-06 NOTE — Patient Instructions (Addendum)
It was very nice to see you today- I will be in touch regarding your labs asap  Please do arrange your mammogram soon and let me know if you need any help in this regard  If you change your mind about getting a colonoscopy or cologuard let me know You might consider seeing an audiologist to have formal hearing testing; they can also help you with a hearing aid if you like

## 2015-08-06 NOTE — Progress Notes (Signed)
Pre visit review using our clinic review tool, if applicable. No additional management support is needed unless otherwise documented below in the visit note. 

## 2015-08-07 ENCOUNTER — Encounter: Payer: Self-pay | Admitting: Family Medicine

## 2015-08-07 LAB — HEPATITIS C ANTIBODY: HCV Ab: NEGATIVE

## 2015-08-07 LAB — COMPREHENSIVE METABOLIC PANEL
ALK PHOS: 65 U/L (ref 39–117)
ALT: 12 U/L (ref 0–35)
AST: 15 U/L (ref 0–37)
Albumin: 4.2 g/dL (ref 3.5–5.2)
BILIRUBIN TOTAL: 0.3 mg/dL (ref 0.2–1.2)
BUN: 15 mg/dL (ref 6–23)
CALCIUM: 9.4 mg/dL (ref 8.4–10.5)
CO2: 27 mEq/L (ref 19–32)
Chloride: 103 mEq/L (ref 96–112)
Creatinine, Ser: 0.69 mg/dL (ref 0.40–1.20)
GFR: 89.75 mL/min (ref >60.00)
GLUCOSE: 73 mg/dL (ref 70–99)
POTASSIUM: 4.1 meq/L (ref 3.5–5.1)
Sodium: 137 mEq/L (ref 135–145)
TOTAL PROTEIN: 7.1 g/dL (ref 6.0–8.3)

## 2015-08-07 LAB — LIPID PANEL
CHOLESTEROL: 251 mg/dL — AB (ref 0–200)
HDL: 60.6 mg/dL (ref >39.00)
LDL Cholesterol: 161 mg/dL — ABNORMAL HIGH (ref 0–99)
NonHDL: 190.81
TRIGLYCERIDES: 148 mg/dL (ref 0.0–149.0)
Total CHOL/HDL Ratio: 4
VLDL: 29.6 mg/dL (ref 0.0–40.0)

## 2015-08-07 LAB — HEMOGLOBIN A1C: Hgb A1c MFr Bld: 5.5 % (ref 4.6–6.5)

## 2015-08-07 LAB — CBC
HEMATOCRIT: 41.2 % (ref 36.0–46.0)
HEMOGLOBIN: 13.7 g/dL (ref 12.0–15.0)
MCHC: 33.3 g/dL (ref 30.0–36.0)
MCV: 88.9 fl (ref 78.0–100.0)
PLATELETS: 245 10*3/uL (ref 150.0–400.0)
RBC: 4.64 Mil/uL (ref 3.87–5.11)
RDW: 14.8 % (ref 11.5–15.5)
WBC: 7.1 10*3/uL (ref 4.0–10.5)

## 2015-08-10 ENCOUNTER — Encounter: Payer: Self-pay | Admitting: Emergency Medicine

## 2015-08-10 ENCOUNTER — Ambulatory Visit (INDEPENDENT_AMBULATORY_CARE_PROVIDER_SITE_OTHER): Payer: Medicare Other | Admitting: Emergency Medicine

## 2015-08-10 VITALS — BP 124/76 | HR 83 | Ht 62.5 in | Wt 217.0 lb

## 2015-08-10 DIAGNOSIS — F17200 Nicotine dependence, unspecified, uncomplicated: Secondary | ICD-10-CM

## 2015-08-10 DIAGNOSIS — R0902 Hypoxemia: Secondary | ICD-10-CM | POA: Diagnosis not present

## 2015-08-10 DIAGNOSIS — G4734 Idiopathic sleep related nonobstructive alveolar hypoventilation: Secondary | ICD-10-CM | POA: Insufficient documentation

## 2015-08-10 DIAGNOSIS — Z87891 Personal history of nicotine dependence: Secondary | ICD-10-CM | POA: Diagnosis not present

## 2015-08-10 MED ORDER — AZELASTINE HCL 0.1 % NA SOLN: 2.0000 | Freq: Two times a day (BID) | NASAL | Status: DC

## 2015-08-10 NOTE — Progress Notes (Signed)
Subjective:    Patient ID: Yolanda Reeves, female    DOB: 1945/12/10, 70 y.o.   MRN: [id number]  HPI 70 year old former smoker (50 pack years), with a history of hypothyroidism, childhood rheumatic heart disease.  She underwent a sleep study on 06/23/15 to evaluate daytime sleepiness, results of which I personally reviewed. This did not show obstructive sleep apnea but did reveal nocturnal hypoxemia throughout the entire night.  She has never been told that she has COPD, does have a hx recurrent cough. Low exertional tolerance but mainly due to fatigue, not necessarily dyspnea.   I suspect that hypoxemia and fatigue are multifactorial - due to meds, hypoxemia.     Review of Systems  Constitutional: Positive for unexpected weight change. Negative for fever.  HENT: Positive for sneezing. Negative for congestion, dental problem, ear pain, nosebleeds, postnasal drip, rhinorrhea, sinus pressure, sore throat and trouble swallowing.   Eyes: Negative for redness and itching.  Respiratory: Positive for cough and shortness of breath. Negative for chest tightness and wheezing.   Cardiovascular: Negative for palpitations and leg swelling.  Gastrointestinal: Negative for nausea and vomiting.  Genitourinary: Negative for dysuria.  Musculoskeletal: Negative for joint swelling.  Skin: Negative for rash.  Neurological: Positive for headaches.  Hematological: Does not bruise/bleed easily.  Psychiatric/Behavioral: Positive for dysphoric mood. The patient is nervous/anxious.     Past Medical History  Diagnosis Date  . WART, RIGHT HAND   . HYPOTHYROIDISM, POST-RADIOACTIVE IODINE   . VITAMIN D DEFICIENCY   . HYPERLIPIDEMIA   . TOBACCO USE   . UNSPECIFIED SUBJECTIVE VISUAL DISTURBANCE   . OSTEOPENIA   . Chest pain, atypical   . Best dystrophy 2012  . Heart murmur   . Sleep apnea      Family History  Problem Relation Age of Onset  . Cancer Brother     thyroid  . Prostate cancer Brother   .  Skin cancer Sister   . Colon cancer Paternal Grandmother   . Thyroid cancer Brother      Social History   Social History  . Marital Status: Divorced    Spouse Name: N/A  . Number of Children: N/A  . Years of Education: 14   Occupational History  . Not on file.   Social History Main Topics  . Smoking status: Former Smoker -- 1.00 packs/day for 50 years    Types: Cigarettes    Quit date: 02/05/2011  . Smokeless tobacco: Not on file  . Alcohol Use: 0.0 oz/week    0 Standard drinks or equivalent per week     Comment: rarely  . Drug Use: No  . Sexual Activity: Not on file   Other Topics Concern  . Not on file   Social History Narrative   Drinks 3-5 cups of caffeine daily.     No Known Allergies   Outpatient Prescriptions Prior to Visit  Medication Sig Dispense Refill  . diazepam (VALIUM) 5 MG tablet Take 5 mg by mouth daily.    . ergocalciferol (VITAMIN D2) 50000 UNITS capsule Take 50,000 Units by mouth once a week. Reported on 08/05/2015    . lamoTRIgine (LAMICTAL) 150 MG tablet Take 150 mg by mouth daily.    Marland Kitchen levothyroxine (SYNTHROID, LEVOTHROID) 125 MCG tablet 125 mcg.    . sertraline (ZOLOFT) 100 MG tablet Take 100 mg by mouth daily.    Marland Kitchen zolpidem (AMBIEN) 10 MG tablet Take 10 mg by mouth at bedtime as needed.    Marland Kitchen ketoconazole (  NIZORAL) 2 % cream Apply 1 application topically. For 2 weeks  0   No facility-administered medications prior to visit.         Objective:   Physical Exam Filed Vitals:   08/10/15 1558 08/10/15 1559  BP:  124/76  Pulse:  83  Height: 5' 2.5" (1.588 m)   Weight: 217 lb (98.431 kg)   SpO2:  98%   Gen: Pleasant, obese woman, in no distress,  normal affect  ENT: No lesions,  mouth clear,  oropharynx clear, no postnasal drip  Neck: No JVD, no TMG, no carotid bruits  Lungs: No use of accessory muscles, clear without rales or rhonchi  Cardiovascular: RRR, heart sounds normal, no murmur or gallops, no peripheral  edema  Musculoskeletal: No deformities, no cyanosis or clubbing  Neuro: alert, non focal  Skin: Warm, no lesions or rashes       Assessment & Plan:  Nocturnal hypoxemia Based on polysomnogram from 06/23/15. Did not have obstructive sleep apnea. I suspect that her hypoxemia is multifactorial, related to restrictive lung disease, medications that suppress her respiratory drive, possibly some degree of obstructive lung disease as well. She is to start oxygen and we will start 2 L/m and night while sleeping. She will then have an overnight oximetry on this dose to confirm that it's adequate.  TOBACCO USE We will perform pulmonary function testing to assess for obstructive lung disease. Presently she may benefit from bronchodilators. We will review the results with her at her next visit.

## 2015-08-10 NOTE — Assessment & Plan Note (Signed)
Based on polysomnogram from 06/23/15. Did not have obstructive sleep apnea. I suspect that her hypoxemia is multifactorial, related to restrictive lung disease, medications that suppress her respiratory drive, possibly some degree of obstructive lung disease as well. She is to start oxygen and we will start 2 L/m and night while sleeping. She will then have an overnight oximetry on this dose to confirm that it's adequate.

## 2015-08-10 NOTE — Patient Instructions (Signed)
We will start oxygen at 2 L/m After oxygen is initiated we will perform an overnight oximetry on 2 L/m with data to be sent to Dr Delton CoombesByrum We will perform full pulmonary function testing at your next office visit Follow with Dr Delton CoombesByrum next available with full PFT

## 2015-08-10 NOTE — Assessment & Plan Note (Signed)
We will perform pulmonary function testing to assess for obstructive lung disease. Presently she may benefit from bronchodilators. We will review the results with her at her next visit.

## 2015-08-19 DIAGNOSIS — H3554 Dystrophies primarily involving the retinal pigment epithelium: Secondary | ICD-10-CM | POA: Diagnosis not present

## 2015-08-19 DIAGNOSIS — H43812 Vitreous degeneration, left eye: Secondary | ICD-10-CM | POA: Diagnosis not present

## 2015-08-19 DIAGNOSIS — H4321 Crystalline deposits in vitreous body, right eye: Secondary | ICD-10-CM | POA: Diagnosis not present

## 2015-08-19 DIAGNOSIS — H35423 Microcystoid degeneration of retina, bilateral: Secondary | ICD-10-CM | POA: Diagnosis not present

## 2015-08-20 ENCOUNTER — Telehealth: Payer: Self-pay

## 2015-08-20 NOTE — Telephone Encounter (Signed)
I called pt to discuss ONO results, Dr. Delton CoombesByrum already has been consulted. No answer, left a message asking her to call us back.

## 2015-08-24 ENCOUNTER — Ambulatory Visit (INDEPENDENT_AMBULATORY_CARE_PROVIDER_SITE_OTHER): Payer: Medicare Other | Admitting: Adult Health

## 2015-08-24 ENCOUNTER — Encounter: Payer: Self-pay | Admitting: Adult Health

## 2015-08-24 VITALS — BP 138/88 | HR 64 | Resp 16 | Ht 62.5 in | Wt 216.0 lb

## 2015-08-24 DIAGNOSIS — G4761 Periodic limb movement disorder: Secondary | ICD-10-CM

## 2015-08-24 DIAGNOSIS — R5383 Other fatigue: Secondary | ICD-10-CM | POA: Diagnosis not present

## 2015-08-24 NOTE — Patient Instructions (Signed)
Follow up with Dr. Delton CoombesByrum  If your symptoms worsen or you develop new symptoms please let us know.

## 2015-08-24 NOTE — Progress Notes (Signed)
PATIENT: Yolanda Reeves DOB: [birth year]  REASON FOR VISIT: follow up- fatigue, periodic leg movement disorder HISTORY FROM: patient  HISTORY OF PRESENT ILLNESS: [name] is a 70 year old female with a history of fatigue and periodic leg movement disorder. She returns today to discuss her overnight pulse oximetry test. Her results do show prolonged desaturation. Her mean O2 was 88%. She spent 6 hours and 17 minutes below 89% saturation and 5 hours and 4 minutes below 88% saturation. Her lowest O2 saturation was 77%. She is already followed by Dr. Delton Coombes at our pulmonology. In the past she had a sleep study that did not show any significant apnea events. The patient states that Dr. Delton Coombes has already ordered an overnight pulse oximetry with oxygen. She states that she will have this test done in June? She denies any new neurological symptoms. She returns today for follow-up.   HISTORY (DOHMEIER): Yolanda Reeves is a 70 y.o. female , seen here as a referral/ revisit from Dr. Nolen Mu. She has seen Dr Terrace Arabia for a stroke evaluation , an MRI follow up in 2008. At the time, her name was Yolanda Reeves.   Chief complaint according to patient : " I have gained so much weight, and feel fatigued over 12 years, progressively "  [name] reports that she had purchased a couch for her office space filled that she could take a lunch breaks in combination with a nap. She states that she is taking a nap now every day and feels that she needs it. She has an irresistible urge to go to sleep. This is independent of the nighttime sleep quality. She reports that even after a good night of 8 hours of sleep she will still be excessively daytime sleepy the following day. She has the comorbidities of obesity, thyroid disease, hypo-tension, and is status post hysterectomy at age 71. she endorsed 13 points on the Epworth sleepiness score, 6 points on her geriatric depression scale and 40 points on the fatigue severity  scale.  Dr. Nolen Mu treats her for bipolar depression. 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   Sleep habits are as follows: The patient goes to bed anytime between 7 PM and 2 AM, she is now no longer working outside the home and this has caused her not to have to adhere to a sleep time. She often yawns throughout the afternoon but this can even happen in the mornings. Many nights it will take her on a couple of minutes to fall asleep but she also has experienced night were it took hours. She usually sleeps until 7 AM, her 3 dogs will wake her. One time nocturia, at 3 AM. She dreams , but not vividly. She has been used to worry a lot, and used to have dreams. In the past these dreams often felt very real. There would have repetitively the same content. She also reports recurrence sleep paralysis. He does not report any dream intrusion into wakefulness. She does not report symptoms of cataplexy. Occasionally she will wake up with a headache usually , not with a dry mouth. She is a prone sleeper, sleeps with 3 pillows. The dogs are in bed with her. The bedroom is cool and dark, she needs a background sound to overpower her tinnitus. She has never been told she snores, or has apnea. No gasping. HA have never woken her but are present in the morning and affect the  occipital region back up the nape of her neck . They usually resolve as the day goes on.  Sleep medical history and family sleep history: Raised by a great aunt, no history.  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 Smoker- for 50 years , quit 3 years ago. Used vapor. No ETOH, Caffeine ; 2-4 mugs .  07-02-2015 [name] returns today for follow-up after her recent sleep study, performed on 06/23/2015. She had no apnea noted. Her AHI was 0.1 she frequently moves but she never woke up from movement her total number of periodic limb movements was  445 minutes. Her oxygen saturation stayed low for the whole study. A former smoker this may be an overlapping condition of COPD. However she has no oxygen at home , has not been diagnosed with low saturation before. She added that she just overcame a bronchitis at the time of the study- this may be the explanation.  I will order a ONO on room air to confirm that this has resolved. She has a history of rheumatic fever as a child ( heart Murmur )and now has sleep onset headache.    REVIEW OF SYSTEMS: Out of a complete 14 system review of symptoms, the patient complains only of the following symptoms, and all other reviewed systems are negative.  See history of present illness  ALLERGIES: No Known Allergies  HOME MEDICATIONS: Outpatient Prescriptions Prior to Visit  Medication Sig Dispense Refill  . azelastine (ASTELIN) 0.1 % nasal spray Place 2 sprays into both nostrils 2 (two) times daily. Use in each nostril as directed 30 mL 11  . diazepam (VALIUM) 5 MG tablet Take 5 mg by mouth daily.    . ergocalciferol (VITAMIN D2) 50000 UNITS capsule Take 50,000 Units by mouth once a week. Reported on 08/05/2015    . lamoTRIgine (LAMICTAL) 150 MG tablet Take 150 mg by mouth daily.    Marland Kitchen levothyroxine (SYNTHROID, LEVOTHROID) 125 MCG tablet 125 mcg.    . sertraline (ZOLOFT) 100 MG tablet Take 100 mg by mouth daily.    Marland Kitchen zolpidem (AMBIEN) 10 MG tablet Take 10 mg by mouth at bedtime as needed.     No facility-administered medications prior to visit.    PAST MEDICAL HISTORY: Past Medical History  Diagnosis Date  . WART, RIGHT HAND   . HYPOTHYROIDISM, POST-RADIOACTIVE IODINE   . VITAMIN D DEFICIENCY   . HYPERLIPIDEMIA   . TOBACCO USE   . UNSPECIFIED SUBJECTIVE VISUAL DISTURBANCE   . OSTEOPENIA   . Chest pain, atypical   . Best dystrophy 2012  . Heart murmur   . Sleep apnea     PAST SURGICAL HISTORY: Past Surgical History  Procedure Laterality Date  . Abdominal hysterectomy    . Laparoscopic  oopherectomy    . Carpal tunnel release    . Tonsillectomy      FAMILY HISTORY: Family History  Problem Relation Age of Onset  . Cancer Brother     thyroid  . Prostate cancer Brother   . Skin cancer Sister   . Colon cancer Paternal Grandmother   . Thyroid cancer Brother     SOCIAL HISTORY: Social History   Social History  . Marital Status: Divorced    Spouse Name: N/A  . Number of Children: N/A  . Years of Education: 14   Occupational History  . Not on file.   Social History Main Topics  . Smoking status: Former Smoker -- 1.00 packs/day  for 50 years    Types: Cigarettes    Quit date: 02/05/2011  . Smokeless tobacco: Not on file  . Alcohol Use: 0.0 oz/week    0 Standard drinks or equivalent per week     Comment: rarely  . Drug Use: No  . Sexual Activity: Not on file   Other Topics Concern  . Not on file   Social History Narrative   Drinks 3-5 cups of caffeine daily.      PHYSICAL EXAM  Filed Vitals:   08/24/15 1036  BP: 138/88  Pulse: 64  Resp: 16  Height: 5' 2.5" (1.588 m)  Weight: 216 lb (97.977 kg)   Body mass index is 38.85 kg/(m^2).  Generalized: Well developed, in no acute distress , Obese  Neurological examination  Mentation: Alert oriented to time, place, history taking. Follows all commands speech and language fluent Cranial nerve II-XII: Pupils were equal round reactive to light. Extraocular movements were full, visual field were full on confrontational test. Facial sensation and strength were normal. Uvula tongue midline. Head turning and shoulder shrug  were normal and symmetric. Motor: The motor testing reveals 5 over 5 strength of all 4 extremities. Good symmetric motor tone is noted throughout.  Sensory: Sensory testing is intact to soft touch on all 4 extremities. No evidence of extinction is noted.  Coordination: Cerebellar testing reveals good finger-nose-finger and heel-to-shin bilaterally.  Gait and station: Gait is normal. Tandem  gait is normal. Romberg is negative. No drift is seen.  Reflexes: Deep tendon reflexes are symmetric and normal bilaterally.   DIAGNOSTIC DATA (LABS, IMAGING, TESTING) - I reviewed patient records, labs, notes, testing and imaging myself where available.  Lab Results  Component Value Date   WBC 7.1 08/06/2015   HGB 13.7 08/06/2015   HCT 41.2 08/06/2015   MCV 88.9 08/06/2015   PLT 245.0 08/06/2015      Component Value Date/Time   NA 137 08/06/2015 1348   K 4.1 08/06/2015 1348   CL 103 08/06/2015 1348   CO2 27 08/06/2015 1348   GLUCOSE 73 08/06/2015 1348   BUN 15 08/06/2015 1348   CREATININE 0.69 08/06/2015 1348   CALCIUM 9.4 08/06/2015 1348   PROT 7.1 08/06/2015 1348   ALBUMIN 4.2 08/06/2015 1348   AST 15 08/06/2015 1348   ALT 12 08/06/2015 1348   ALKPHOS 65 08/06/2015 1348   BILITOT 0.3 08/06/2015 1348   GFRNONAA 78 06/11/2008 0832   GFRAA 94 06/11/2008 0832   Lab Results  Component Value Date   CHOL 251* 08/06/2015   HDL 60.60 08/06/2015   LDLCALC 161* 08/06/2015   LDLDIRECT 145.5 08/30/2012   TRIG 148.0 08/06/2015   CHOLHDL 4 08/06/2015   Lab Results  Component Value Date   HGBA1C 5.5 08/06/2015        ASSESSMENT AND PLAN 70 y.o. year old female  has a past medical history of WART, RIGHT HAND; HYPOTHYROIDISM, POST-RADIOACTIVE IODINE; VITAMIN D DEFICIENCY; HYPERLIPIDEMIA; TOBACCO USE; UNSPECIFIED SUBJECTIVE VISUAL DISTURBANCE; OSTEOPENIA; Chest pain, atypical; Best dystrophy (2012); Heart murmur; and Sleep apnea. here with:  1. Fatigue 2. Periodic leg movement disorder  The patient's ONO did show prolonged desaturation. She is followed by [facility] at [facility]. She has a repeat overnight pulse oximetry with oxygen scheduled with him. Patient advised that she should keep her follow-ups with her pulmonologist. She can follow up with our office on an as-needed basis.   Butch PennyMegan Andretta Ergle, MSN, NP-C 08/24/2015, 10:39 AM [facility] [phone]  [address], [place], [place] [address] [phone]

## 2015-08-24 NOTE — Progress Notes (Signed)
I agree with the assessment and plan as directed by NP .The patient is known to me .   Winefred Hillesheim, MD  

## 2015-08-26 NOTE — Telephone Encounter (Signed)
Pt was seen in the office by Aundra MilletMegan, NP on 08/24/15 and ONO results discussed at that appt.

## 2015-08-30 ENCOUNTER — Encounter: Payer: Self-pay | Admitting: *Deleted

## 2015-09-01 DIAGNOSIS — R0600 Dyspnea, unspecified: Secondary | ICD-10-CM | POA: Diagnosis not present

## 2015-09-02 DIAGNOSIS — R0600 Dyspnea, unspecified: Secondary | ICD-10-CM | POA: Diagnosis not present

## 2015-09-03 DIAGNOSIS — R0902 Hypoxemia: Secondary | ICD-10-CM | POA: Diagnosis not present

## 2015-09-03 DIAGNOSIS — J984 Other disorders of lung: Secondary | ICD-10-CM | POA: Diagnosis not present

## 2015-09-14 ENCOUNTER — Encounter: Payer: Self-pay | Admitting: Emergency Medicine

## 2015-09-21 ENCOUNTER — Other Ambulatory Visit: Payer: Self-pay | Admitting: Family Medicine

## 2015-09-21 DIAGNOSIS — N6320 Unspecified lump in the left breast, unspecified quadrant: Secondary | ICD-10-CM

## 2015-09-22 ENCOUNTER — Telehealth: Payer: Self-pay | Admitting: Emergency Medicine

## 2015-09-22 DIAGNOSIS — G4734 Idiopathic sleep related nonobstructive alveolar hypoventilation: Secondary | ICD-10-CM

## 2015-09-22 NOTE — Telephone Encounter (Addendum)
Per RB >> Desaturation documented on room air. Pt qualifies for nocturnal oxygen. Order has already been placed for this. Check with pt to see if this has been set up.  Pt is aware of ONO results. [facility] has set her up with oxygen. We need to repeat the ONO on 2L per RB. Order has been placed. Nothing further was needed.

## 2015-10-04 DIAGNOSIS — J984 Other disorders of lung: Secondary | ICD-10-CM | POA: Diagnosis not present

## 2015-10-04 DIAGNOSIS — R0902 Hypoxemia: Secondary | ICD-10-CM | POA: Diagnosis not present

## 2015-10-06 ENCOUNTER — Ambulatory Visit
Admission: RE | Admit: 2015-10-06 | Discharge: 2015-10-06 | Disposition: A | Discharge status: Home / Self Care | Payer: Medicare Other | Source: Ambulatory Visit | Attending: Family Medicine | Admitting: Family Medicine

## 2015-10-06 ENCOUNTER — Encounter: Payer: Self-pay | Admitting: Family Medicine

## 2015-10-06 DIAGNOSIS — M8589 Other specified disorders of bone density and structure, multiple sites: Secondary | ICD-10-CM | POA: Diagnosis not present

## 2015-10-06 DIAGNOSIS — N6320 Unspecified lump in the left breast, unspecified quadrant: Secondary | ICD-10-CM

## 2015-10-06 DIAGNOSIS — M8580 Other specified disorders of bone density and structure, unspecified site: Secondary | ICD-10-CM

## 2015-10-06 DIAGNOSIS — N63 Unspecified lump in breast: Secondary | ICD-10-CM | POA: Diagnosis not present

## 2015-10-06 DIAGNOSIS — Z780 Asymptomatic menopausal state: Secondary | ICD-10-CM | POA: Diagnosis not present

## 2015-11-03 DIAGNOSIS — J984 Other disorders of lung: Secondary | ICD-10-CM | POA: Diagnosis not present

## 2015-11-03 DIAGNOSIS — R0902 Hypoxemia: Secondary | ICD-10-CM | POA: Diagnosis not present

## 2015-11-24 ENCOUNTER — Ambulatory Visit (INDEPENDENT_AMBULATORY_CARE_PROVIDER_SITE_OTHER): Payer: Medicare Other | Admitting: Emergency Medicine

## 2015-11-24 ENCOUNTER — Encounter: Payer: Self-pay | Admitting: Emergency Medicine

## 2015-11-24 VITALS — BP 102/62 | HR 82 | Ht 61.0 in | Wt 204.0 lb

## 2015-11-24 DIAGNOSIS — F17200 Nicotine dependence, unspecified, uncomplicated: Secondary | ICD-10-CM | POA: Diagnosis not present

## 2015-11-24 DIAGNOSIS — Z87891 Personal history of nicotine dependence: Secondary | ICD-10-CM

## 2015-11-24 DIAGNOSIS — G4734 Idiopathic sleep related nonobstructive alveolar hypoventilation: Secondary | ICD-10-CM

## 2015-11-24 LAB — PULMONARY FUNCTION TEST
DL/VA % pred: 103 %
DL/VA: 4.56 ml/min/mmHg/L
DLCO COR % PRED: 99 %
DLCO COR: 20.16 ml/min/mmHg
DLCO UNC: 21.51 ml/min/mmHg
DLCO unc % pred: 106 %
FEF 25-75 POST: 2.09 L/s
FEF 25-75 Pre: 2.58 L/sec
FEF2575-%Change-Post: -19 %
FEF2575-%PRED-POST: 117 %
FEF2575-%PRED-PRE: 145 %
FEV1-%CHANGE-POST: 3 %
FEV1-%PRED-POST: 115 %
FEV1-%Pred-Pre: 111 %
FEV1-POST: 2.33 L
FEV1-Pre: 2.25 L
FEV1FVC-%CHANGE-POST: 3 %
FEV1FVC-%PRED-PRE: 106 %
FEV6-%Change-Post: 1 %
FEV6-%Pred-Post: 108 %
FEV6-%Pred-Pre: 106 %
FEV6-Post: 2.76 L
FEV6-Pre: 2.72 L
FEV6FVC-%Change-Post: 0 %
FEV6FVC-%Pred-Post: 103 %
FEV6FVC-%Pred-Pre: 104 %
FVC-%CHANGE-POST: 0 %
FVC-%PRED-POST: 104 %
FVC-%PRED-PRE: 104 %
FVC-POST: 2.78 L
FVC-PRE: 2.79 L
POST FEV1/FVC RATIO: 84 %
PRE FEV1/FVC RATIO: 81 %
PRE FEV6/FVC RATIO: 100 %
Post FEV6/FVC ratio: 100 %
RV % pred: 91 %
RV: 1.85 L
TLC % PRED: 98 %
TLC: 4.51 L

## 2015-11-24 NOTE — Progress Notes (Signed)
Subjective:    Patient ID: Yolanda Reeves, female    DOB: [birth year], 70 y.o.   MRN: [id number]  HPI 70 year old former smoker (50 pack years), with a history of hypothyroidism, childhood rheumatic heart disease.  She underwent a sleep study on 06/23/15 to evaluate daytime sleepiness, results of which I personally reviewed. This did not show obstructive sleep apnea but did reveal nocturnal hypoxemia throughout the entire night.  She has never been told that she has COPD, does have a hx recurrent cough. Low exertional tolerance but mainly due to fatigue, not necessarily dyspnea.   I suspect that hypoxemia and fatigue are multifactorial - due to meds, hypoxemia.   ROV 11/24/15 -- patient with a history tobacco use with newly identified dyspnea and nocturnal hypoxemia. Her sleep study did not show overt obstructive sleep apnea but did confirm hypoxemia. We performed pulmonary function testing to evaluate for possible COPD. These were done on 6/20 and I reviewed these personally. No evidence of obstruction. Normal volumes, normal diffusion.  She is still using O2 at night.     Review of Systems  Constitutional: Positive for unexpected weight change. Negative for fever.  HENT: Positive for sneezing. Negative for congestion, dental problem, ear pain, nosebleeds, postnasal drip, rhinorrhea, sinus pressure, sore throat and trouble swallowing.   Eyes: Negative for redness and itching.  Respiratory: Positive for cough and shortness of breath. Negative for chest tightness and wheezing.   Cardiovascular: Negative for palpitations and leg swelling.  Gastrointestinal: Negative for nausea and vomiting.  Genitourinary: Negative for dysuria.  Musculoskeletal: Negative for joint swelling.  Skin: Negative for rash.  Neurological: Positive for headaches.  Hematological: Does not bruise/bleed easily.  Psychiatric/Behavioral: Positive for dysphoric mood. The patient is nervous/anxious.     Past Medical  History  Diagnosis Date  . WART, RIGHT HAND   . HYPOTHYROIDISM, POST-RADIOACTIVE IODINE   . VITAMIN D DEFICIENCY   . HYPERLIPIDEMIA   . TOBACCO USE   . UNSPECIFIED SUBJECTIVE VISUAL DISTURBANCE   . OSTEOPENIA   . Chest pain, atypical   . Best dystrophy 2012  . Heart murmur   . Sleep apnea      Family History  Problem Relation Age of Onset  . Cancer Brother     thyroid  . Prostate cancer Brother   . Skin cancer Sister   . Colon cancer Paternal Grandmother   . Thyroid cancer Brother      Social History   Social History  . Marital Status: Divorced    Spouse Name: N/A  . Number of Children: N/A  . Years of Education: 14   Occupational History  . Not on file.   Social History Main Topics  . Smoking status: Former Smoker -- 1.00 packs/day for 50 years    Types: Cigarettes    Quit date: 02/05/2011  . Smokeless tobacco: Not on file  . Alcohol Use: 0.0 oz/week    0 Standard drinks or equivalent per week     Comment: rarely  . Drug Use: No  . Sexual Activity: Not on file   Other Topics Concern  . Not on file   Social History Narrative   Drinks 3-5 cups of caffeine daily.     No Known Allergies   Outpatient Prescriptions Prior to Visit  Medication Sig Dispense Refill  . azelastine (ASTELIN) 0.1 % nasal spray Place 2 sprays into both nostrils 2 (two) times daily. Use in each nostril as directed 30 mL 11  . diazepam (VALIUM)  5 MG tablet Take 5 mg by mouth daily.    . ergocalciferol (VITAMIN D2) 50000 UNITS capsule Take 50,000 Units by mouth once a week. Reported on 08/05/2015    . lamoTRIgine (LAMICTAL) 150 MG tablet Take 150 mg by mouth daily.    Marland Kitchen levothyroxine (SYNTHROID, LEVOTHROID) 125 MCG tablet Take 125 mcg by mouth daily before breakfast.     . sertraline (ZOLOFT) 100 MG tablet Take 100 mg by mouth daily.    Marland Kitchen zolpidem (AMBIEN) 10 MG tablet Take 10 mg by mouth at bedtime as needed.     No facility-administered medications prior to visit.           Objective:   Physical Exam Filed Vitals:   11/24/15 1053 11/24/15 1058  BP:  102/62  Pulse:  82  Height:  (1.549 m)   Weight: 204 lb (92.534 kg)   SpO2:  96%   Gen: Pleasant, obese woman, in no distress,  normal affect  ENT: No lesions,  mouth clear,  oropharynx clear, no postnasal drip  Neck: No JVD, no TMG, no carotid bruits  Lungs: No use of accessory muscles, clear without rales or rhonchi  Cardiovascular: RRR, heart sounds normal, no murmur or gallops, no peripheral edema  Musculoskeletal: No deformities, no cyanosis or clubbing  Neuro: alert, non focal  Skin: Warm, no lesions or rashes       Assessment & Plan:  TOBACCO USE Without any evidence of COPD based on alert function testing today. We will not start any inhaled medications at this time.  Nocturnal hypoxemia Multifactorial related to obesity and some subclinical changes from her tobacco use. She does not have obstruction on exam. She does not have obstructive sleep apnea. I believe we should reassess her overnight oximetry on room air after she has had an opportunity to exercise and lose more weight. She may be up to stop the oxygen at that time.   Levy Pupa, MD, PhD 11/24/2015, 11:36 AM [facility] [phone] or if no answer [phone]

## 2015-11-24 NOTE — Patient Instructions (Signed)
Your breathing testing is normal - no need to start any inhaler medication Please continue your good work on diet, exercise and weight loss.  Continue your oxygen at night for now. We will recheck this in the future.  Follow with Dr Delton CoombesByrum in 12 months or sooner if you have any problems

## 2015-11-24 NOTE — Progress Notes (Signed)
PFT done today. 

## 2015-11-24 NOTE — Assessment & Plan Note (Signed)
Multifactorial related to obesity and some subclinical changes from her tobacco use. She does not have obstruction on exam. She does not have obstructive sleep apnea. I believe we should reassess her overnight oximetry on room air after she has had an opportunity to exercise and lose more weight. She may be up to stop the oxygen at that time.

## 2015-11-24 NOTE — Assessment & Plan Note (Signed)
Without any evidence of COPD based on alert function testing today. We will not start any inhaled medications at this time.

## 2015-12-04 DIAGNOSIS — J984 Other disorders of lung: Secondary | ICD-10-CM | POA: Diagnosis not present

## 2015-12-04 DIAGNOSIS — R0902 Hypoxemia: Secondary | ICD-10-CM | POA: Diagnosis not present

## 2016-01-03 DIAGNOSIS — J984 Other disorders of lung: Secondary | ICD-10-CM | POA: Diagnosis not present

## 2016-01-03 DIAGNOSIS — R0902 Hypoxemia: Secondary | ICD-10-CM | POA: Diagnosis not present

## 2016-02-03 DIAGNOSIS — J984 Other disorders of lung: Secondary | ICD-10-CM | POA: Diagnosis not present

## 2016-02-03 DIAGNOSIS — R0902 Hypoxemia: Secondary | ICD-10-CM | POA: Diagnosis not present

## 2016-02-24 DIAGNOSIS — H43812 Vitreous degeneration, left eye: Secondary | ICD-10-CM | POA: Diagnosis not present

## 2016-02-24 DIAGNOSIS — H3554 Dystrophies primarily involving the retinal pigment epithelium: Secondary | ICD-10-CM | POA: Diagnosis not present

## 2016-02-24 DIAGNOSIS — H35423 Microcystoid degeneration of retina, bilateral: Secondary | ICD-10-CM | POA: Diagnosis not present

## 2016-02-24 DIAGNOSIS — H4321 Crystalline deposits in vitreous body, right eye: Secondary | ICD-10-CM | POA: Diagnosis not present

## 2016-03-05 DIAGNOSIS — J984 Other disorders of lung: Secondary | ICD-10-CM | POA: Diagnosis not present

## 2016-03-05 DIAGNOSIS — R0902 Hypoxemia: Secondary | ICD-10-CM | POA: Diagnosis not present

## 2016-04-04 DIAGNOSIS — R0902 Hypoxemia: Secondary | ICD-10-CM | POA: Diagnosis not present

## 2016-04-04 DIAGNOSIS — J984 Other disorders of lung: Secondary | ICD-10-CM | POA: Diagnosis not present

## 2016-05-05 DIAGNOSIS — J984 Other disorders of lung: Secondary | ICD-10-CM | POA: Diagnosis not present

## 2016-05-05 DIAGNOSIS — R0902 Hypoxemia: Secondary | ICD-10-CM | POA: Diagnosis not present

## 2016-06-04 DIAGNOSIS — J984 Other disorders of lung: Secondary | ICD-10-CM | POA: Diagnosis not present

## 2016-06-04 DIAGNOSIS — R0902 Hypoxemia: Secondary | ICD-10-CM | POA: Diagnosis not present

## 2016-07-05 DIAGNOSIS — R0902 Hypoxemia: Secondary | ICD-10-CM | POA: Diagnosis not present

## 2016-07-05 DIAGNOSIS — J984 Other disorders of lung: Secondary | ICD-10-CM | POA: Diagnosis not present

## 2016-08-03 DIAGNOSIS — R0902 Hypoxemia: Secondary | ICD-10-CM | POA: Diagnosis not present

## 2016-08-03 DIAGNOSIS — J984 Other disorders of lung: Secondary | ICD-10-CM | POA: Diagnosis not present

## 2016-08-24 DIAGNOSIS — H43812 Vitreous degeneration, left eye: Secondary | ICD-10-CM | POA: Diagnosis not present

## 2016-08-24 DIAGNOSIS — H43391 Other vitreous opacities, right eye: Secondary | ICD-10-CM | POA: Diagnosis not present

## 2016-08-24 DIAGNOSIS — H3554 Dystrophies primarily involving the retinal pigment epithelium: Secondary | ICD-10-CM | POA: Diagnosis not present

## 2016-08-24 DIAGNOSIS — H35423 Microcystoid degeneration of retina, bilateral: Secondary | ICD-10-CM | POA: Diagnosis not present

## 2016-08-24 DIAGNOSIS — H4321 Crystalline deposits in vitreous body, right eye: Secondary | ICD-10-CM | POA: Diagnosis not present

## 2016-09-15 ENCOUNTER — Telehealth: Payer: Self-pay | Admitting: *Deleted

## 2016-09-15 NOTE — Telephone Encounter (Signed)
Left message for patient to return call to schedule AWV.  

## 2016-09-19 ENCOUNTER — Encounter: Payer: Self-pay | Admitting: Family Medicine

## 2016-09-19 ENCOUNTER — Ambulatory Visit (INDEPENDENT_AMBULATORY_CARE_PROVIDER_SITE_OTHER): Payer: Medicare Other | Admitting: Family Medicine

## 2016-09-19 VITALS — BP 118/68 | HR 64 | Temp 97.8°F | Resp 14 | Ht 61.0 in | Wt 200.5 lb

## 2016-09-19 DIAGNOSIS — E785 Hyperlipidemia, unspecified: Secondary | ICD-10-CM | POA: Diagnosis not present

## 2016-09-19 DIAGNOSIS — H543 Unqualified visual loss, both eyes: Secondary | ICD-10-CM

## 2016-09-19 DIAGNOSIS — E559 Vitamin D deficiency, unspecified: Secondary | ICD-10-CM | POA: Diagnosis not present

## 2016-09-19 DIAGNOSIS — Z131 Encounter for screening for diabetes mellitus: Secondary | ICD-10-CM

## 2016-09-19 DIAGNOSIS — E039 Hypothyroidism, unspecified: Secondary | ICD-10-CM

## 2016-09-19 DIAGNOSIS — Z130 Encounter for screening for diseases of the blood and blood-forming organs and certain disorders involving the immune mechanism: Secondary | ICD-10-CM

## 2016-09-19 DIAGNOSIS — Z0000 Encounter for general adult medical examination without abnormal findings: Secondary | ICD-10-CM

## 2016-09-19 DIAGNOSIS — F39 Unspecified mood [affective] disorder: Secondary | ICD-10-CM

## 2016-09-19 DIAGNOSIS — Z13220 Encounter for screening for lipoid disorders: Secondary | ICD-10-CM

## 2016-09-19 LAB — COMPREHENSIVE METABOLIC PANEL
ALBUMIN: 4.1 g/dL (ref 3.5–5.2)
ALT: 9 U/L (ref 0–35)
AST: 12 U/L (ref 0–37)
Alkaline Phosphatase: 69 U/L (ref 39–117)
BUN: 21 mg/dL (ref 6–23)
CALCIUM: 9.4 mg/dL (ref 8.4–10.5)
CHLORIDE: 105 meq/L (ref 96–112)
CO2: 25 meq/L (ref 19–32)
CREATININE: 0.77 mg/dL (ref 0.40–1.20)
GFR: 78.82 mL/min (ref >60.00)
Glucose, Bld: 92 mg/dL (ref 70–99)
POTASSIUM: 4.1 meq/L (ref 3.5–5.1)
Sodium: 138 mEq/L (ref 135–145)
Total Bilirubin: 0.3 mg/dL (ref 0.2–1.2)
Total Protein: 7.2 g/dL (ref 6.0–8.3)

## 2016-09-19 LAB — CBC
HEMATOCRIT: 42 % (ref 36.0–46.0)
Hemoglobin: 14.2 g/dL (ref 12.0–15.0)
MCHC: 33.7 g/dL (ref 30.0–36.0)
MCV: 88.9 fl (ref 78.0–100.0)
PLATELETS: 262 10*3/uL (ref 150.0–400.0)
RBC: 4.72 Mil/uL (ref 3.87–5.11)
RDW: 15.1 % (ref 11.5–15.5)
WBC: 8.5 10*3/uL (ref 4.0–10.5)

## 2016-09-19 LAB — LDL CHOLESTEROL, DIRECT: Direct LDL: 150 mg/dL

## 2016-09-19 LAB — HEMOGLOBIN A1C: HEMOGLOBIN A1C: 5.5 % (ref 4.6–6.5)

## 2016-09-19 LAB — LIPID PANEL
CHOLESTEROL: 259 mg/dL — AB (ref 0–200)
HDL: 49.5 mg/dL (ref >39.00)
NonHDL: 209.33
TRIGLYCERIDES: 287 mg/dL — AB (ref 0.0–149.0)
Total CHOL/HDL Ratio: 5
VLDL: 57.4 mg/dL — AB (ref 0.0–40.0)

## 2016-09-19 LAB — TSH: TSH: 2.67 u[IU]/mL (ref 0.35–4.50)

## 2016-09-19 LAB — VITAMIN D 25 HYDROXY (VIT D DEFICIENCY, FRACTURES): VITD: 19.07 ng/mL — AB (ref 30.00–100.00)

## 2016-09-19 MED ORDER — SERTRALINE HCL 100 MG PO TABS: 100.0000 mg | ORAL_TABLET | Freq: Every day | ORAL | 3 refills | Status: DC

## 2016-09-19 MED ORDER — LAMOTRIGINE 150 MG PO TABS: 150.0000 mg | ORAL_TABLET | Freq: Every day | ORAL | 3 refills | Status: DC

## 2016-09-19 NOTE — Patient Instructions (Signed)
It was nice to see you today- congrats on your weight loss!  I will be in touch with your labs asap I refilled your lamictal and zoloft for a year

## 2016-09-19 NOTE — Progress Notes (Signed)
Pre visit review using our clinic review tool, if applicable. No additional management support is needed unless otherwise documented below in the visit note. 

## 2016-09-19 NOTE — Progress Notes (Addendum)
[facility] [address], [address] [place], [place] [address] [phone]  Date:  09/19/2016   Name:  Yolanda Reeves   DOB:  26-Mar-1946   MRN:  [id number]  PCP:  Abbe Amsterdam, MD    Chief Complaint: Annual Exam and Cough   History of Present Illness:  Yolanda Reeves is a 71 y.o. very pleasant female patient who presents with the following:  Last seen by myself a little over a year ago with the following:  History of HTN, hypothyroidism, obesity, osteopenia.  Here today for a CPE Last labs in computer from 2014 She notes that "I'm going blind, my hearing is going on me, I have gained a ton of weight."  She has complaint of being tired all the time.  She sleeps all night but feels tired by mid morning. She did have a sleep study per [facility] neuro. This is still in progress but it seems that although she had low oxygen at night but she does not have OSA.  She is going to see pulmonology and may have nighttime O2 eventually She is fasting today for labs.   Dr. Allyne Gee is her retinal specialist. Her optho is Dr. Sherryle Lis Dr Nolen Mu is her psychiatrist   Mammo: a few years ago, she plans to set this up herself soon Colonoscopy 2006- she does not wish to do this again.  She had polyps but has decided to decline any further screening Flu shot is UTD Tetanus UTD Zostavax: no record She had pneumovax but needs prevnar.  She would like to do this today Pap: s/p hysterectomy.  She was told that she did not need to continue to have paps.    Her thyroid has been managed by Dr. Talmage Nap- last TSH about 2 months ago. Pt reports that per Dr. Talmage Nap she will no longer need to follow-up with Dr. Talmage Nap and that we can manage her TSH for her  She also has seen pulmonology and been told that she has nocturnal hypoxemia (but not OSA)- has oxygen but not CPAP machine  Reports that she is doing "fine."  She has oxygen but does not always use it at  night.  In fact she rarely uses it and does not feel that she needs it any more.  She feels like she is resting well She has lost some weight- she is doing the keto diet. Losing weight has helped her breathing and general wellbeing  She is not fasting today- she just had lunch actually   She had been managed by Dr. Nolen Mu but she is leaving clinical practice.  Yolanda Reeves would like for [place] to take over her mental health meds also if possible She takes lamicatal once a day, zoloft once a day.  She has not needed her valium really,and has not needed her Remus Loffler much recently either.  Her mood is generally stable  She is not able to drive nay longer due to her vision.  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  She is managed closely by opthalmology   Lab Results  Component Value Date   TSH 0.97 05/16/2013   Wt Readings from Last 3 Encounters:  09/19/16 200 lb 8 oz (90.9 kg)  11/24/15 204 lb (92.5 kg)  08/24/15 216 lb (98 kg)   BP Readings from Last 3 Encounters:  09/19/16 118/68  11/24/15 102/62  08/24/15 138/88    Patient Active  Problem List   Diagnosis Date Noted  . Nocturnal hypoxemia 08/10/2015  . Obesity (BMI 30-39.9) 05/19/2013  . Elevated BP 08/31/2012  . Routine general medical examination at a health care facility 08/30/2012  . Chest pain, atypical 04/02/2012  . HYPERLIPIDEMIA 01/16/2009  . VITAMIN D DEFICIENCY 01/05/2009  . UNSPECIFIED SUBJECTIVE VISUAL DISTURBANCE 01/05/2009  . WART, RIGHT HAND 04/02/2008  . HYPOTHYROIDISM, POST-RADIOACTIVE IODINE 04/02/2008  . TOBACCO USE 04/02/2008  . OSTEOPENIA 04/02/2008    Past Medical History:  Diagnosis Date  . Best dystrophy 2012  . Chest pain, atypical   . Heart murmur   . HYPERLIPIDEMIA   . HYPOTHYROIDISM, POST-RADIOACTIVE IODINE   . OSTEOPENIA   . Sleep apnea   . TOBACCO USE   . UNSPECIFIED SUBJECTIVE VISUAL DISTURBANCE   . VITAMIN D DEFICIENCY   . WART, RIGHT HAND     Past Surgical  History:  Procedure Laterality Date  . ABDOMINAL HYSTERECTOMY    . CARPAL TUNNEL RELEASE    . LAPAROSCOPIC OOPHERECTOMY    . TONSILLECTOMY      Social History  Substance Use Topics  . Smoking status: Former Smoker    Packs/day: 1.00    Years: 50.00    Types: Cigarettes    Quit date: 02/05/2011  . Smokeless tobacco: Never Used  . Alcohol use 0.0 oz/week     Comment: rarely    Family History  Problem Relation Age of Onset  . Cancer Brother     thyroid  . Prostate cancer Brother   . Skin cancer Sister   . Colon cancer Paternal Grandmother   . Thyroid cancer Brother     No Known Allergies  Medication list has been reviewed and updated.  Current Outpatient Prescriptions on File Prior to Visit  Medication Sig Dispense Refill  . azelastine (ASTELIN) 0.1 % nasal spray Place 2 sprays into both nostrils 2 (two) times daily. Use in each nostril as directed 30 mL 11  . diazepam (VALIUM) 5 MG tablet Take 5 mg by mouth daily.    . ergocalciferol (VITAMIN D2) 50000 UNITS capsule Take 50,000 Units by mouth once a week. Reported on 08/05/2015    . lamoTRIgine (LAMICTAL) 150 MG tablet Take 150 mg by mouth daily.    Marland Kitchen levothyroxine (SYNTHROID, LEVOTHROID) 125 MCG tablet Take 125 mcg by mouth daily before breakfast.     . sertraline (ZOLOFT) 100 MG tablet Take 100 mg by mouth daily.    Marland Kitchen zolpidem (AMBIEN) 10 MG tablet Take 10 mg by mouth at bedtime as needed.     No current facility-administered medications on file prior to visit.     Review of Systems:  As per HPI- otherwise negative.   Physical Examination: Vitals:   09/19/16 1350  BP: 118/68  Pulse: 64  Resp: 14  Temp: 97.8 F (36.6 C)   Vitals:   09/19/16 1350  Weight: 200 lb 8 oz (90.9 kg)  Height:  (1.549 m)   Body mass index is 37.88 kg/m. Ideal Body Weight: Weight in (lb) to have BMI = 25: 132  GEN: WDWN, NAD, Non-toxic, A & O x 3, obese, looks well HEENT: Atraumatic, Normocephalic. Neck supple. No masses,  No LAD.  Bilateral TM wnl, oropharynx normal.  PEERL,EOMI.   Ears and Nose: No external deformity. CV: RRR, No M/G/R. No JVD. No thrill. No extra heart sounds. PULM: CTA B, no wheezes, crackles, rhonchi. No retractions. No resp. distress. No accessory muscle use. ABD: S, NT, ND. No  rebound. No HSM. EXTR: No c/c/e NEURO Normal gait.  PSYCH: Normally interactive. Conversant. Not depressed or anxious appearing.  Calm demeanor.  Breast: normal exam, no masses/ dimpling/ discharge  Assessment and Plan: Physical exam  Screening for hyperlipidemia - Plan: Lipid panel  Screening for deficiency anemia - Plan: CBC  Low, vision, both eyes  Hypothyroidism, unspecified type - Plan: TSH  Mood disorder (HCC) - Plan: lamoTRIgine (LAMICTAL) 150 MG tablet, sertraline (ZOLOFT) 100 MG tablet  Vitamin D deficiency - Plan: Vitamin D (25 hydroxy)  Screening for diabetes mellitus - Plan: Comprehensive metabolic panel, Hemoglobin A1c  Here today for a CPE We will manage her thyroid and mental health rx for the time being- she brings in her chart from Dr. Loralie Champagne office and asks [place] to abstract as needed, we may shred the rest  Will plan further follow- up pending labs. She has lost weight- great job  Signed Abbe Amsterdam, MD  Received her labs 4/20- would like to give her another round of rx vitamin D and recommend cholesterol med.  No answer, will try her back. Thyroid is ok A1c is ok  Called pt to go over her labs 4/22-  rx for vit D rx for lovastatin Also it turns out she is on sertraline 150/d- corrected this for her  Meds ordered this encounter  Medications  . lamoTRIgine (LAMICTAL) 150 MG tablet    Sig: Take 1 tablet (150 mg total) by mouth daily.    Dispense:  90 tablet    Refill:  3  . DISCONTD: sertraline (ZOLOFT) 100 MG tablet    Sig: Take 1 tablet (100 mg total) by mouth daily.    Dispense:  90 tablet    Refill:  3  . ergocalciferol (VITAMIN D2) 50000 units capsule     Sig: Take 1 capsule (50,000 Units total) by mouth once a week. Reported on 08/05/2015    Dispense:  12 capsule    Refill:  0  . lovastatin (MEVACOR) 20 MG tablet    Sig: Take 1 tablet (20 mg total) by mouth at bedtime.    Dispense:  30 tablet    Refill:  5  . sertraline (ZOLOFT) 100 MG tablet    Sig: Take 1.5 tablets (150 mg total) by mouth daily.    Dispense:  135 tablet    Refill:  3   Will also sent her a letter with labs   Results for orders placed or performed in visit on 09/19/16  CBC  Result Value Ref Range   WBC 8.5 4.0 - 10.5 K/uL   RBC 4.72 3.87 - 5.11 Mil/uL   Platelets 262.0 150.0 - 400.0 K/uL   Hemoglobin 14.2 12.0 - 15.0 g/dL   HCT 16.1 09.6 - 04.5 %   MCV 88.9 78.0 - 100.0 fl   MCHC 33.7 30.0 - 36.0 g/dL   RDW 40.9 81.1 - 91.4 %  Comprehensive metabolic panel  Result Value Ref Range   Sodium 138 135 - 145 mEq/L   Potassium 4.1 3.5 - 5.1 mEq/L   Chloride 105 96 - 112 mEq/L   CO2 25 19 - 32 mEq/L   Glucose, Bld 92 70 - 99 mg/dL   BUN 21 6 - 23 mg/dL   Creatinine, Ser 7.82 0.40 - 1.20 mg/dL   Total Bilirubin 0.3 0.2 - 1.2 mg/dL   Alkaline Phosphatase 69 39 - 117 U/L   AST 12 0 - 37 U/L   ALT 9 0 - 35 U/L  Total Protein 7.2 6.0 - 8.3 g/dL   Albumin 4.1 3.5 - 5.2 g/dL   Calcium 9.4 8.4 - 16.1 mg/dL   GFR 09.60 >45.40 mL/min  TSH  Result Value Ref Range   TSH 2.67 0.35 - 4.50 uIU/mL  Hemoglobin A1c  Result Value Ref Range   Hgb A1c MFr Bld 5.5 4.6 - 6.5 %  Lipid panel  Result Value Ref Range   Cholesterol 259 (H) 0 - 200 mg/dL   Triglycerides 981.1 (H) 0.0 - 149.0 mg/dL   HDL 91.47 >82.95 mg/dL   VLDL 62.1 (H) 0.0 - 30.8 mg/dL   Total CHOL/HDL Ratio 5    NonHDL 209.33   Vitamin D (25 hydroxy)  Result Value Ref Range   VITD 19.07 (L) 30.00 - 100.00 ng/mL  LDL cholesterol, direct  Result Value Ref Range   Direct LDL 150.0 mg/dL

## 2016-09-25 MED ORDER — LOVASTATIN 20 MG PO TABS: 20.0000 mg | ORAL_TABLET | Freq: Every day | ORAL | 5 refills | Status: DC

## 2016-09-25 MED ORDER — SERTRALINE HCL 100 MG PO TABS: 150.0000 mg | ORAL_TABLET | Freq: Every day | ORAL | 3 refills | Status: DC

## 2016-09-25 MED ORDER — ERGOCALCIFEROL 1.25 MG (50000 UT) PO CAPS
50000.0000 [IU] | ORAL_CAPSULE | ORAL | 0 refills | Status: DC
Start: 2016-09-25 — End: 2023-07-10

## 2016-09-25 NOTE — Addendum Note (Signed)
Addended by: Abbe Amsterdam C on: 09/25/2016 05:01 PM   Modules accepted: Orders

## 2016-09-26 ENCOUNTER — Other Ambulatory Visit: Payer: Self-pay | Admitting: Emergency Medicine

## 2016-09-26 MED ORDER — LEVOTHYROXINE SODIUM 125 MCG PO TABS: 125.0000 ug | ORAL_TABLET | Freq: Every day | ORAL | 1 refills | Status: DC

## 2016-12-16 ENCOUNTER — Other Ambulatory Visit: Payer: Self-pay | Admitting: Emergency Medicine

## 2016-12-16 DIAGNOSIS — E785 Hyperlipidemia, unspecified: Secondary | ICD-10-CM

## 2016-12-16 MED ORDER — LOVASTATIN 20 MG PO TABS: 20.0000 mg | ORAL_TABLET | Freq: Every day | ORAL | 1 refills | Status: DC

## 2016-12-30 ENCOUNTER — Telehealth: Payer: Self-pay | Admitting: Emergency Medicine

## 2016-12-30 NOTE — Telephone Encounter (Signed)
Spoke with Jake SamplesAthena at [facility], advised that pt has not been seen since 11/2015.  Nothing further needed at this time.

## 2017-01-16 ENCOUNTER — Other Ambulatory Visit: Payer: Self-pay | Admitting: Family Medicine

## 2017-01-21 DIAGNOSIS — L259 Unspecified contact dermatitis, unspecified cause: Secondary | ICD-10-CM | POA: Diagnosis not present

## 2017-02-22 ENCOUNTER — Telehealth: Payer: Self-pay | Admitting: Family Medicine

## 2017-02-22 DIAGNOSIS — H35423 Microcystoid degeneration of retina, bilateral: Secondary | ICD-10-CM | POA: Diagnosis not present

## 2017-02-22 DIAGNOSIS — H4321 Crystalline deposits in vitreous body, right eye: Secondary | ICD-10-CM | POA: Diagnosis not present

## 2017-02-22 DIAGNOSIS — H3554 Dystrophies primarily involving the retinal pigment epithelium: Secondary | ICD-10-CM | POA: Diagnosis not present

## 2017-02-22 DIAGNOSIS — H43391 Other vitreous opacities, right eye: Secondary | ICD-10-CM | POA: Diagnosis not present

## 2017-02-22 NOTE — Telephone Encounter (Signed)
Called pt to schedule AWV. Lvm for pt to call office to schedule appt. Also sent pt Emmi education for Wellness Visit. Last AWV was 08/30/2012. Appt can be scheduled at anytime.

## 2017-03-02 ENCOUNTER — Other Ambulatory Visit: Payer: Self-pay | Admitting: Emergency Medicine

## 2017-03-02 NOTE — Telephone Encounter (Signed)
Requesting: diazepam (VALIUM) 5 MG tablet Contract:  UDS: Last OV: 09/19/16 Last Refill  Please Advise

## 2017-03-03 MED ORDER — DIAZEPAM 5 MG PO TABS: ORAL_TABLET | ORAL | 0 refills | Status: DC

## 2017-03-03 NOTE — Telephone Encounter (Signed)
Called pt and LMOM- I have not filled her diazepam before. She has not filled it for over a year per [facility].  Will give her 30 pills today, we can discuss her use pattern in further detail at next visit Previously was being filled by her psychiatrist Dr. Nolen Mu  Meds ordered this encounter  Medications  . diazepam (VALIUM) 5 MG tablet    Sig: Take 1/2 or 1 tablet po daily as needed for anxiety    Dispense:  30 tablet    Refill:  0

## 2017-04-03 ENCOUNTER — Other Ambulatory Visit: Payer: Self-pay | Admitting: Family Medicine

## 2017-04-03 DIAGNOSIS — E785 Hyperlipidemia, unspecified: Secondary | ICD-10-CM

## 2017-04-04 ENCOUNTER — Other Ambulatory Visit: Payer: Self-pay | Admitting: Emergency Medicine

## 2017-04-04 ENCOUNTER — Encounter: Payer: Self-pay | Admitting: Family Medicine

## 2017-04-04 MED ORDER — DIAZEPAM 5 MG PO TABS: ORAL_TABLET | ORAL | 0 refills | Status: DC

## 2017-04-04 NOTE — Telephone Encounter (Signed)
Received refill request from [facility]  Requesting: diazepam (VALIUM) 5 MG tablet Contract UDS Last OV: 09/19/16 Last Refill: 03/03/17  Please Advise

## 2017-04-11 ENCOUNTER — Telehealth: Payer: Self-pay | Admitting: Family Medicine

## 2017-04-11 NOTE — Telephone Encounter (Signed)
Copied from CRM [id number]. Topic: Quick Communication - See Telephone Encounter >> Apr 11, 2017  1:27 PM Windy KalataMichael, Recie Cirrincione L, NT wrote: CRM for notification. See Telephone encounter for:   Elisabeth PigeonDevine from [facility] is calling to get a refill on the diazepam for pt.   04/11/17.

## 2017-04-12 MED ORDER — DIAZEPAM 5 MG PO TABS: ORAL_TABLET | ORAL | 0 refills | Status: DC

## 2017-04-12 NOTE — Telephone Encounter (Signed)
Filled her diazepam on 10/16. Ok to send in RF now Called pt- she is no longer taking [place].  She does not need the diazepam yet but wanted to have it on file for later

## 2017-08-19 ENCOUNTER — Other Ambulatory Visit: Payer: Self-pay | Admitting: Family Medicine

## 2017-08-19 DIAGNOSIS — F39 Unspecified mood [affective] disorder: Secondary | ICD-10-CM

## 2017-11-13 ENCOUNTER — Other Ambulatory Visit: Payer: Self-pay | Admitting: Family Medicine

## 2017-11-13 DIAGNOSIS — F39 Unspecified mood [affective] disorder: Secondary | ICD-10-CM

## 2017-12-20 DIAGNOSIS — M13861 Other specified arthritis, right knee: Secondary | ICD-10-CM | POA: Diagnosis not present

## 2018-01-03 ENCOUNTER — Telehealth: Payer: Self-pay | Admitting: Family Medicine

## 2018-01-03 NOTE — Telephone Encounter (Signed)
Copied from CRM [id number]. Topic: General - Other >> Jan 03, 2018  9:13 AM Stephannie LiSimmons, Rydge Texidor L, NT wrote: Reason for CRM Patient called and would like an appointment for a AWV  and  also a follow up for aurgent care visit she had on 12/20/17 and would like both visits in the same day she states she understand she will have a wait time between visits  please advise , [phone] or  [phone]

## 2018-01-03 NOTE — Telephone Encounter (Signed)
Could not get back to back appts scheduled until late Aug. Pt needs to see PCP sooner.   appt made for Dr. Patsy Lageropland 01/08/18 at 1130am . Pt aware

## 2018-01-06 NOTE — Progress Notes (Signed)
[facility] [address], [address] [place], [place] [address] [phone] [phone]  Date:  01/08/2018   Name:  Yolanda Reeves   DOB:  [birth year]   MRN:  [id number]  PCP:  Yolanda Reeves    Chief Complaint: Follow-up (right knee pain, arthritis, radiating down to calf to ankles)   History of Present Illness:  Yolanda Reeves is a 72 y.o. very pleasant female patient who presents with the following:  Following up from an UC visit today History of nocturnal hypoxemia, obesity, hyperlipidemia, depression, hypothyroidism, vision loss  Last seen by myself in April as follows:  She also has seen pulmonology and been told that she has nocturnal hypoxemia (but not OSA)- has oxygen but not CPAP machine Reports that she is doing "fine."  She has oxygen but does not always use it at night.  In fact she rarely uses it and does not feel that she needs it any more.  She feels like she is resting well She has lost some weight- she is doing the keto diet. Losing weight has helped her breathing and general wellbeing She had been managed by Dr. Nolen MuMcKinney but she is leaving clinical practice.  Yolanda Reeves would like for us to take over her mental health meds also if possible She takes lamicatal once a day, zoloft once a day.  She has not needed her valium really,and has not needed her Remus Lofflerambien much recently either.  Her mood is generally stable She is not able to drive nay longer due to her vision.  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  She is managed closely by opthalmology   Pt notes that she was having some right knee pain which started about one month ago. This got to be really severe and she was using a cane to walk around the house  She went to UC about 3 weeks ago and did have some x-rays. She was told that she had arthitis and might need shots in her knees.  She was given a shot in her behind and some sort of pill Never had  any knee issues in the past No recent falls or other injuries that she can recall She has a cane that she will use if she has to but she did not bring it today  Her knee hurts  It has felt unstable more down the back of the calf at times.  The pain from her knee seems to be running down into her calf now as well Never had any joint operations as of yet She does not have an orthopedist  Ice and elevation do help with pain   Wt Readings from Last 3 Encounters:  01/08/18 210 lb (95.3 kg)  09/19/16 200 lb 8 oz (90.9 kg)  11/24/15 204 lb (92.5 kg)   She is having a hard time following the keto diet as cooking is hard with her low vision She continues to work on this  Patient Active Problem List   Diagnosis Date Noted  . Nocturnal hypoxemia 08/10/2015  . Obesity (BMI 30-39.9) 05/19/2013  . Elevated BP 08/31/2012  . Routine general medical examination at a health care facility 08/30/2012  . Chest pain, atypical 04/02/2012  . HYPERLIPIDEMIA 01/16/2009  . VITAMIN D DEFICIENCY 01/05/2009  . UNSPECIFIED SUBJECTIVE VISUAL DISTURBANCE 01/05/2009  . WART, RIGHT HAND 04/02/2008  . HYPOTHYROIDISM, POST-RADIOACTIVE IODINE 04/02/2008  . TOBACCO USE  04/02/2008  . OSTEOPENIA 04/02/2008    Past Medical History:  Diagnosis Date  . Best dystrophy 2012  . Chest pain, atypical   . Heart murmur   . HYPERLIPIDEMIA   . HYPOTHYROIDISM, POST-RADIOACTIVE IODINE   . OSTEOPENIA   . Sleep apnea   . TOBACCO USE   . UNSPECIFIED SUBJECTIVE VISUAL DISTURBANCE   . VITAMIN D DEFICIENCY   . WART, RIGHT HAND     Past Surgical History:  Procedure Laterality Date  . ABDOMINAL HYSTERECTOMY    . CARPAL TUNNEL RELEASE    . LAPAROSCOPIC OOPHERECTOMY    . TONSILLECTOMY      Social History   Tobacco Use  . Smoking status: Former Smoker    Packs/day: 1.00    Years: 50.00    Pack years: 50.00    Types: Cigarettes    Last attempt to quit: 02/05/2011    Years since quitting: 6.9  . Smokeless tobacco: Never  Used  Substance Use Topics  . Alcohol use: Yes    Alcohol/week: 0.0 oz    Comment: rarely  . Drug use: No    Family History  Problem Relation Age of Onset  . Cancer Brother        thyroid  . Prostate cancer Brother   . Skin cancer Sister   . Colon cancer Paternal Grandmother   . Thyroid cancer Brother     No Known Allergies  Medication list has been reviewed and updated.  Current Outpatient Medications on File Prior to Visit  Medication Sig Dispense Refill  . azelastine (ASTELIN) 0.1 % nasal spray Place 2 sprays into both nostrils 2 (two) times daily. Use in each nostril as directed 30 mL 11  . diazepam (VALIUM) 5 MG tablet Take 1/2 or 1 tablet po daily as needed for anxiety 90 tablet 0  . ergocalciferol (VITAMIN D2) 50000 units capsule Take 1 capsule (50,000 Units total) by mouth once a week. Reported on 08/05/2015 12 capsule 0  . lamoTRIgine (LAMICTAL) 150 MG tablet TAKE 1 TABLET BY MOUTH  DAILY 30 tablet 0  . levothyroxine (SYNTHROID, LEVOTHROID) 125 MCG tablet TAKE 1 TABLET BY MOUTH  DAILY BEFORE BREAKFAST 30 tablet 0  . lovastatin (MEVACOR) 20 MG tablet TAKE 1 TABLET BY MOUTH AT  BEDTIME 90 tablet 1  . lovastatin (MEVACOR) 20 MG tablet TAKE 1 TABLET BY MOUTH AT  BEDTIME 30 tablet 0  . meloxicam (MOBIC) 7.5 MG tablet TK 1 T PO QD FOR JOINT PAIN  0  . sertraline (ZOLOFT) 100 MG tablet TAKE 1 AND 1/2 TABLETS BY  MOUTH DAILY 45 tablet 0   No current facility-administered medications on file prior to visit.     Review of Systems:  As per HPI- otherwise negative. No fever or chills No CP or SOB   Physical Examination: Vitals:   01/08/18 1137  BP: 120/72  Pulse: 88  Resp: 16  SpO2: 97%   Vitals:   01/08/18 1137  Weight: 210 lb (95.3 kg)  Height: 5\' 1"  (1.549 m)   Body mass index is 39.68 kg/m. Ideal Body Weight: Weight in (lb) to have BMI = 25: 132  GEN: WDWN, NAD, Non-toxic, A & O x 3, obese, looks well  HEENT: Atraumatic, Normocephalic. Neck supple. No  masses, No LAD. Ears and Nose: No external deformity. CV: RRR, No M/G/R. No JVD. No thrill. No extra heart sounds. PULM: CTA B, no wheezes, crackles, rhonchi. No retractions. No resp. distress. No accessory muscle use. EXTR: No  c/c/e NEURO Normal gait.  PSYCH: Normally interactive. Conversant. Not depressed or anxious appearing.  Calm demeanor.  RIGHT knee: normal ROM and no significant crepitus. She has lateral joint line tenderness and pain with valgus stress on the joint  No effusion, heat or redness   I reviewed her outside X-ray on CT- she has really minimal degenerave change of her right knee  Assessment and Plan: Acute pain of right knee - Plan: Ambulatory referral to Orthopedic Surgery  Here today with knee pain. I don't think this is all due to arthritis She has been to [facility] years ago and liked them- will refer back to their office In the meantime continue to ice and elevate leg as needed   Signed Abbe Amsterdam, Reeves

## 2018-01-08 ENCOUNTER — Ambulatory Visit (INDEPENDENT_AMBULATORY_CARE_PROVIDER_SITE_OTHER): Payer: Medicare Other | Admitting: Family Medicine

## 2018-01-08 ENCOUNTER — Encounter: Payer: Self-pay | Admitting: Family Medicine

## 2018-01-08 VITALS — BP 120/72 | HR 88 | Resp 16 | Ht 61.0 in | Wt 210.0 lb

## 2018-01-08 DIAGNOSIS — M25561 Pain in right knee: Secondary | ICD-10-CM

## 2018-01-08 NOTE — Patient Instructions (Signed)
We will refer you to orthopedics to look at your right knee- [facility] on [address] in [place] In the meantime ice and elevation may be helpful for your Let me know if you need anything and do take your CD with you to your ortho appt

## 2018-01-09 ENCOUNTER — Encounter (INDEPENDENT_AMBULATORY_CARE_PROVIDER_SITE_OTHER): Payer: Self-pay | Admitting: Surgery

## 2018-01-09 ENCOUNTER — Ambulatory Visit (INDEPENDENT_AMBULATORY_CARE_PROVIDER_SITE_OTHER): Payer: Medicare Other | Admitting: Surgery

## 2018-01-09 VITALS — BP 125/72 | HR 66 | Temp 97.3°F

## 2018-01-09 DIAGNOSIS — M25561 Pain in right knee: Secondary | ICD-10-CM | POA: Diagnosis not present

## 2018-01-09 MED ORDER — BUPIVACAINE HCL 0.25 % IJ SOLN
6.0000 mL | INTRAMUSCULAR | Status: AC | PRN
  Administered 2018-01-09: 6 mL via INTRA_ARTICULAR

## 2018-01-09 MED ORDER — METHYLPREDNISOLONE ACETATE 40 MG/ML IJ SUSP
80.0000 mg | INTRAMUSCULAR | Status: AC | PRN
  Administered 2018-01-09: 80 mg

## 2018-01-09 MED ORDER — LIDOCAINE HCL 1 % IJ SOLN
1.0000 mL | INTRAMUSCULAR | Status: AC | PRN
  Administered 2018-01-09: 1 mL

## 2018-01-09 NOTE — Progress Notes (Signed)
Office Visit Note   Patient: Yolanda Reeves           Date of Birth: 09/05/1945           MRN: [id number] Visit Date: 01/09/2018              Requested by: Pearline Cablesopland, Jessica C, MD [address] [place], [place] [id number] PCP: Pearline Cablesopland, Jessica C, MD   Assessment & Plan: Visit Diagnoses:  1. Acute pain of right knee     Plan: Today I recommended conservative treatment with right knee injection.  Patient sent knee was prepped with Betadine and intra-articular Marcaine/Depo-Medrol injection was performed.  Patient will follow-up with me in 3 weeks for recheck.  A week before her appointment she will call to let me know how she is feeling.  If she still continues to be symptomatic and having some feeling of mechanical symptoms I will go ahead and schedule MRI to rule out meniscus tear and evaluate the extent of chondromalacia.  If patient is doing well she may call and cancel the appointment.  All questions answered.  Follow-Up Instructions: Return in about 3 weeks (around 01/30/2018) for With Fayrene FearingJames for recheck right knee.   Orders:  No orders of the defined types were placed in this encounter.  No orders of the defined types were placed in this encounter.     Procedures: Large Joint Inj: R knee on 01/09/2018 3:35 PM Indications: pain Details: 25 G 1.5 in needle, medial approach Medications: 1 mL lidocaine 1 %; 6 mL bupivacaine 0.25 %; 80 mg methylPREDNISolone acetate 40 MG/ML Outcome: tolerated well, no immediate complications Consent was given by the patient.       Clinical Data: No additional findings.   Subjective: Chief Complaint  Patient presents with  . Right Knee - Pain    HPI 72 year old female comes in today with complaints of right knee pain and some feeling of mechanical symptoms.  Patient states that pain is been ongoing x1 month without injury.  Localizes pain more to the lateral compartment.  Some feeling of popping and catching.  has also had  some swelling in the knee.  Pain with ambulation, squatting.   Review of Systems No current cardiac pulmonary GI GU issues  Objective: Vital Signs: BP 125/72 (BP Location: Right Arm, Patient Position: Sitting)   Pulse 66   Temp (!) 97.3 F (36.3 C) (Oral)   Physical Exam  Constitutional: She is oriented to person, place, and time. She appears well-developed.  HENT:  Head: Normocephalic.  Pulmonary/Chest: No respiratory distress.  Abdominal: She exhibits no distension.  Musculoskeletal:  Gait antalgic.  Negative logroll bilateral hips.  Right knee she has good range of motion.  No much by the way of patellofemoral crepitus.  Exquisitely tender at the lateral joint line.  Some swelling with small effusion.  Positive McMurray's test.  Cruciate collateral ligaments are stable.  Calf nontender.  Neurological: She is alert and oriented to person, place, and time.  Skin: Skin is warm and dry.  Psychiatric: She has a normal mood and affect.    Ortho Exam  Specialty Comments:  No specialty comments available.  Imaging: No results found.   PMFS History: Patient Active Problem List   Diagnosis Date Noted  . Nocturnal hypoxemia 08/10/2015  . Obesity (BMI 30-39.9) 05/19/2013  . Elevated BP 08/31/2012  . Routine general medical examination at a health care facility 08/30/2012  . Chest pain, atypical 04/02/2012  .  HYPERLIPIDEMIA 01/16/2009  . VITAMIN D DEFICIENCY 01/05/2009  . UNSPECIFIED SUBJECTIVE VISUAL DISTURBANCE 01/05/2009  . WART, RIGHT HAND 04/02/2008  . HYPOTHYROIDISM, POST-RADIOACTIVE IODINE 04/02/2008  . TOBACCO USE 04/02/2008  . OSTEOPENIA 04/02/2008   Past Medical History:  Diagnosis Date  . Best dystrophy 2012  . Chest pain, atypical   . Heart murmur   . HYPERLIPIDEMIA   . HYPOTHYROIDISM, POST-RADIOACTIVE IODINE   . OSTEOPENIA   . Sleep apnea   . TOBACCO USE   . UNSPECIFIED SUBJECTIVE VISUAL DISTURBANCE   . VITAMIN D DEFICIENCY   . WART, RIGHT HAND       Family History  Problem Relation Age of Onset  . Cancer Brother        thyroid  . Prostate cancer Brother   . Skin cancer Sister   . Colon cancer Paternal Grandmother   . Thyroid cancer Brother     Past Surgical History:  Procedure Laterality Date  . ABDOMINAL HYSTERECTOMY    . CARPAL TUNNEL RELEASE    . LAPAROSCOPIC OOPHERECTOMY    . TONSILLECTOMY     Social History   Occupational History  . Not on file  Tobacco Use  . Smoking status: Former Smoker    Packs/day: 1.00    Years: 50.00    Pack years: 50.00    Types: Cigarettes    Last attempt to quit: 02/05/2011    Years since quitting: 6.9  . Smokeless tobacco: Never Used  Substance and Sexual Activity  . Alcohol use: Yes    Alcohol/week: 0.0 oz    Comment: rarely  . Drug use: No  . Sexual activity: Not on file

## 2018-01-15 ENCOUNTER — Other Ambulatory Visit: Payer: Self-pay

## 2018-01-15 DIAGNOSIS — F39 Unspecified mood [affective] disorder: Secondary | ICD-10-CM

## 2018-01-15 DIAGNOSIS — E785 Hyperlipidemia, unspecified: Secondary | ICD-10-CM

## 2018-01-15 MED ORDER — SERTRALINE HCL 100 MG PO TABS: 150.0000 mg | ORAL_TABLET | Freq: Every day | ORAL | 1 refills | Status: DC

## 2018-01-15 MED ORDER — LEVOTHYROXINE SODIUM 125 MCG PO TABS: ORAL_TABLET | ORAL | 1 refills | Status: DC

## 2018-01-15 MED ORDER — LAMOTRIGINE 150 MG PO TABS: 150.0000 mg | ORAL_TABLET | Freq: Every day | ORAL | 1 refills | Status: DC

## 2018-01-15 MED ORDER — LOVASTATIN 20 MG PO TABS: 20.0000 mg | ORAL_TABLET | Freq: Every day | ORAL | 1 refills | Status: DC

## 2018-01-31 ENCOUNTER — Ambulatory Visit (INDEPENDENT_AMBULATORY_CARE_PROVIDER_SITE_OTHER): Payer: Medicare Other | Admitting: Surgery

## 2018-01-31 ENCOUNTER — Encounter (INDEPENDENT_AMBULATORY_CARE_PROVIDER_SITE_OTHER): Payer: Self-pay | Admitting: Surgery

## 2018-01-31 VITALS — Ht 61.0 in | Wt 210.0 lb

## 2018-01-31 DIAGNOSIS — M1711 Unilateral primary osteoarthritis, right knee: Secondary | ICD-10-CM

## 2018-01-31 NOTE — Progress Notes (Signed)
Office Visit Note   Patient: Yolanda Reeves           Date of Birth: 1946/02/02           MRN: [id number] Visit Date: 01/31/2018              Requested by: Pearline Cables, MD [address] [place], [place] [address] PCP: Pearline Cables, MD   Assessment & Plan: Visit Diagnoses:  1. Unilateral primary osteoarthritis, right knee     Plan: Patient states that currently her right knee is doing much better after injection.  She will follow-up in 6 weeks for recheck.  If she is doing well at that point she may cancel appointment.  In a few weeks if her pain returns along with mechanical symptoms she will call to let me know and I will schedule MRI.  All questions answered  Follow-Up Instructions: Return in about 6 weeks (around 03/14/2018).   Orders:  No orders of the defined types were placed in this encounter.  No orders of the defined types were placed in this encounter.     Procedures: No procedures performed   Clinical Data: No additional findings.   Subjective: Chief Complaint  Patient presents with  . Right Knee - Follow-up    01/09/18 right knee injection     HPI Patient returns for recheck of right knee.  States that knee pain has been doing better after performing intra-articular Marcaine/Depo-Medrol injection last office visit.  She is still continues to have some soreness and discomfort around the knee but again this is much better. Review of Systems No current cardiac pulmonary GI GU issues  Objective: Vital Signs: Ht 5\' 1"  (1.549 m)   Wt 210 lb (95.3 kg)   BMI 39.68 kg/m   Physical Exam  Constitutional: She is oriented to person, place, and time. No distress.  HENT:  Head: Normocephalic.  Eyes: Pupils are equal, round, and reactive to light. EOM are normal.  Pulmonary/Chest: No respiratory distress.  Musculoskeletal:  Patient ambulating much better.  Positive patellofemoral crepitus right knee.  Joint line less tender.  Calf  nontender.  Neurological: She is alert and oriented to person, place, and time.  Skin: Skin is warm and dry.    Ortho Exam  Specialty Comments:  No specialty comments available.  Imaging: No results found.   PMFS History: Patient Active Problem List   Diagnosis Date Noted  . Nocturnal hypoxemia 08/10/2015  . Obesity (BMI 30-39.9) 05/19/2013  . Elevated BP 08/31/2012  . Routine general medical examination at a health care facility 08/30/2012  . Chest pain, atypical 04/02/2012  . HYPERLIPIDEMIA 01/16/2009  . VITAMIN D DEFICIENCY 01/05/2009  . UNSPECIFIED SUBJECTIVE VISUAL DISTURBANCE 01/05/2009  . WART, RIGHT HAND 04/02/2008  . HYPOTHYROIDISM, POST-RADIOACTIVE IODINE 04/02/2008  . TOBACCO USE 04/02/2008  . OSTEOPENIA 04/02/2008   Past Medical History:  Diagnosis Date  . Best dystrophy 2012  . Chest pain, atypical   . Heart murmur   . HYPERLIPIDEMIA   . HYPOTHYROIDISM, POST-RADIOACTIVE IODINE   . OSTEOPENIA   . Sleep apnea   . TOBACCO USE   . UNSPECIFIED SUBJECTIVE VISUAL DISTURBANCE   . VITAMIN D DEFICIENCY   . WART, RIGHT HAND     Family History  Problem Relation Age of Onset  . Cancer Brother        thyroid  . Prostate cancer Brother   . Skin cancer Sister   . Colon cancer Paternal  Grandmother   . Thyroid cancer Brother     Past Surgical History:  Procedure Laterality Date  . ABDOMINAL HYSTERECTOMY    . CARPAL TUNNEL RELEASE    . LAPAROSCOPIC OOPHERECTOMY    . TONSILLECTOMY     Social History   Occupational History  . Not on file  Tobacco Use  . Smoking status: Former Smoker    Packs/day: 1.00    Years: 50.00    Pack years: 50.00    Types: Cigarettes    Last attempt to quit: 02/05/2011    Years since quitting: 6.9  . Smokeless tobacco: Never Used  Substance and Sexual Activity  . Alcohol use: Yes    Alcohol/week: 0.0 standard drinks    Comment: rarely  . Drug use: No  . Sexual activity: Not on file

## 2018-02-15 ENCOUNTER — Other Ambulatory Visit: Payer: Self-pay | Admitting: Family Medicine

## 2018-02-15 DIAGNOSIS — F39 Unspecified mood [affective] disorder: Secondary | ICD-10-CM

## 2018-03-14 ENCOUNTER — Ambulatory Visit (INDEPENDENT_AMBULATORY_CARE_PROVIDER_SITE_OTHER): Payer: Medicare Other | Admitting: Surgery

## 2018-04-16 ENCOUNTER — Other Ambulatory Visit: Payer: Self-pay | Admitting: Family Medicine

## 2018-04-16 DIAGNOSIS — F39 Unspecified mood [affective] disorder: Secondary | ICD-10-CM

## 2018-04-16 DIAGNOSIS — E785 Hyperlipidemia, unspecified: Secondary | ICD-10-CM

## 2018-06-25 ENCOUNTER — Other Ambulatory Visit: Payer: Self-pay | Admitting: Family Medicine

## 2018-06-25 DIAGNOSIS — F39 Unspecified mood [affective] disorder: Secondary | ICD-10-CM

## 2018-07-18 DIAGNOSIS — H5213 Myopia, bilateral: Secondary | ICD-10-CM | POA: Diagnosis not present

## 2018-07-18 DIAGNOSIS — H40023 Open angle with borderline findings, high risk, bilateral: Secondary | ICD-10-CM | POA: Diagnosis not present

## 2018-07-18 DIAGNOSIS — H2513 Age-related nuclear cataract, bilateral: Secondary | ICD-10-CM | POA: Diagnosis not present

## 2018-07-18 DIAGNOSIS — H04122 Dry eye syndrome of left lacrimal gland: Secondary | ICD-10-CM | POA: Diagnosis not present

## 2018-07-31 ENCOUNTER — Other Ambulatory Visit: Payer: Self-pay | Admitting: Family Medicine

## 2019-02-06 ENCOUNTER — Other Ambulatory Visit: Payer: Self-pay | Admitting: Family Medicine

## 2019-02-06 DIAGNOSIS — F39 Unspecified mood [affective] disorder: Secondary | ICD-10-CM

## 2019-02-25 ENCOUNTER — Other Ambulatory Visit: Payer: Self-pay | Admitting: Family Medicine

## 2019-02-25 DIAGNOSIS — F39 Unspecified mood [affective] disorder: Secondary | ICD-10-CM

## 2019-02-25 DIAGNOSIS — E559 Vitamin D deficiency, unspecified: Secondary | ICD-10-CM

## 2019-02-25 NOTE — Telephone Encounter (Signed)
Requested medication (s) are due for refill today: yes  Requested medication (s) are on the active medication list: yes   Future visit scheduled: yes  Notes to clinic:   Review for refill   Requested Prescriptions  Pending Prescriptions Disp Refills   ergocalciferol (VITAMIN D2) 1.25 MG (50000 UT) capsule 12 capsule 0    Sig: Take 1 capsule (50,000 Units total) by mouth once a week. Reported on 08/05/2015     Endocrinology:  Vitamins - Vitamin D Supplementation Failed - 02/25/2019 10:17 AM      Failed - 50,000 IU strengths are not delegated      Failed - Ca in normal range and within 360 days    Calcium  Date Value Ref Range Status  09/19/2016 9.4 8.4 - 10.5 mg/dL Final         Failed - Phosphate in normal range and within 360 days    No results found for: PHOS       Failed - Vitamin D in normal range and within 360 days    Vitamin D2 1, 25 (OH)2  Date Value Ref Range Status  08/30/2012 <8 pg/mL Final    Comment:    Vitamin D3, 1,25(OH)2 indicates both endogenous production and supplementation.  Vitamin D2, 1,25(OH)2 is an indicator of exogeous sources, such as diet or supplementation.  Interpretation and therapy are based on measurement of Vitamin D,1,25(OH)2, Total. This test was developed and its performance characteristics have been determined by [facility], [place], [place]. Performance characteristics refer to the analytical performance of the test.   Vitamin D3 1, 25 (OH)2  Date Value Ref Range Status  08/30/2012 78 pg/mL Final   Vitamin D 1, 25 (OH)2 Total  Date Value Ref Range Status  08/30/2012 78 (H) 18 - 72 pg/mL Final   VITD  Date Value Ref Range Status  09/19/2016 19.07 (L) 30.00 - 100.00 ng/mL Final         Failed - Valid encounter within last 12 months    Recent Outpatient Visits          1 year ago Acute pain of right knee   Holiday representative at [facility] Cop[place], Gwenlyn Found, MD   2 years ago  Physical exam   Holiday representative at [facility] Copland, Gwenlyn Found, MD   3 years ago Physical exam   Holiday representative at [facility] Copland, Gwenlyn Found, MD   4 years ago Poison ivy dermatitis   [facility] at [place], [place], DO      Future Appointments            In 3 days Copland, Gwenlyn Found, MD [facility]            lamoTRIgine (LAMICTAL) 150 MG tablet 90 tablet 1    Sig: Take 1 tablet (150 mg total) by mouth daily.     Not Delegated - Neurology:  Anticonvulsants Failed - 02/25/2019 10:17 AM      Failed - This refill cannot be delegated      Failed - HCT in normal range and within 360 days    HCT  Date Value Ref Range Status  09/19/2016 42.0 36.0 - 46.0 % Final         Failed - HGB in normal range and within 360 days    Hemoglobin  Date Value Ref Range Status  09/19/2016 14.2  12.0 - 15.0 g/dL Final         Failed - PLT in normal range and within 360 days    Platelets  Date Value Ref Range Status  09/19/2016 262.0 150.0 - 400.0 K/uL Final         Failed - WBC in normal range and within 360 days    WBC  Date Value Ref Range Status  09/19/2016 8.5 4.0 - 10.5 K/uL Final         Failed - Valid encounter within last 12 months    Recent Outpatient Visits          1 year ago Acute pain of right knee   Holiday representative at [facility] Cop[place], Gwenlyn Found, MD   2 years ago Physical exam   Holiday representative at [facility] Copland, Gwenlyn Found, MD   3 years ago Physical exam   Holiday representative at [facility] Copland, Gwenlyn Found, MD   4 years ago Poison ivy dermatitis   Primary [facility] at [place], [place], DO      Future Appointments            In 3 days Copland, Gwenlyn Found, MD [facility] at [facility]            lovastatin (MEVACOR) 20 MG tablet 30 tablet 0    Sig: Take 1  tablet (20 mg total) by mouth at bedtime.     Cardiovascular:  Antilipid - Statins Failed - 02/25/2019 10:17 AM      Failed - Total Cholesterol in normal range and within 360 days    Cholesterol  Date Value Ref Range Status  09/19/2016 259 (H) 0 - 200 mg/dL Final    Comment:    ATP III Classification       Desirable:  < 200 mg/dL               Borderline High:  200 - 239 mg/dL          High:  > = 941 mg/dL         Failed - LDL in normal range and within 360 days    LDL Cholesterol  Date Value Ref Range Status  08/06/2015 161 (H) 0 - 99 mg/dL Final         Failed - HDL in normal range and within 360 days    HDL  Date Value Ref Range Status  09/19/2016 49.50 >39.00 mg/dL Final         Failed - Triglycerides in normal range and within 360 days    Triglycerides  Date Value Ref Range Status  09/19/2016 287.0 (H) 0.0 - 149.0 mg/dL Final    Comment:    Normal:  <150 mg/dLBorderline High:  150 - 199 mg/dL         Failed - Valid encounter within last 12 months    Recent Outpatient Visits          1 year ago Acute pain of right knee   Holiday representative at [facility], Gwenlyn Found, MD   2 years ago Physical exam   Holiday representative at [facility] [place], Gwenlyn Found, MD   3 years ago Physical exam   Holiday representative at [facility] Cop[place], Gwenlyn Found, MD   4 years ago Poison ivy dermatitis   [facility] at [place],  Thao P, DO      Future Appointments            In 3 days Copland, Gay Filler, MD [facility], [place] - Patient is not pregnant       meloxicam (MOBIC) 7.5 MG tablet   0    Sig: TK 1 T PO QD FOR JOINT PAIN     Analgesics:  COX2 Inhibitors Failed - 02/25/2019 10:17 AM      Failed - HGB in normal range and within 360 days    Hemoglobin  Date Value Ref Range Status  09/19/2016 14.2 12.0 - 15.0 g/dL Final         Failed - Cr in normal  range and within 360 days    Creatinine, Ser  Date Value Ref Range Status  09/19/2016 0.77 0.40 - 1.20 mg/dL Final         Failed - Valid encounter within last 12 months    Recent Outpatient Visits          1 year ago Acute pain of right knee   [facility]ivist at [facility], Gay Filler, MD   2 years ago Physical exam   Archivist at [place], Gay Filler, MD   3 years ago Physical exam   Archivist at [place], Gay Filler, MD   4 years ago Poison ivy dermatitis   [facility] at [place], [place], DO      Future Appointments            In 3 days Copland, Gay Filler, MD [place] [facility] [place], [place] - Patient is not pregnant       sertraline (ZOLOFT) 100 MG tablet 90 tablet 0    Sig: Take 1.5 tablets (150 mg total) by mouth daily.     Psychiatry:  Antidepressants - SSRI Failed - 02/25/2019 10:17 AM      Failed - Valid encounter within last 6 months    Recent Outpatient Visits          1 year ago Acute pain of right knee   Archivist at [place], Gay Filler, MD   2 years ago Physical exam   Archivist at [place], Gay Filler, MD   3 years ago Physical exam   Archivist at [place], Gay Filler, MD   4 years ago Poison ivy dermatitis   Primary Care at [facility], [place]ilden Fossa, DO      Future Appointments            In 3 days Copland, Gay Filler, MD [place] at [facility], [place]           Failed - Completed PHQ-2 or PHQ-9 in the last 360 days.

## 2019-02-25 NOTE — Telephone Encounter (Signed)
Medication Refill - Medication: azelastine (ASTELIN) 0.1 % nasal spray/ergocalciferol (VITAMIN D2) 50000 units capsule/lamoTRIgine (LAMICTAL) 150 MG tablet/levothyroxine (SYNTHROID) 125 MCG tablet/lovastatin (MEVACOR) 20 MG tablet/meloxicam (MOBIC) 7.5 MG tablet/sertraline (ZOLOFT) 100 MG tablet  Has the patient contacted their pharmacy? No. (Agent: If no, request that the patient contact the pharmacy for the refill.) (Agent: If yes, when and what did the pharmacy advise?)  Preferred Pharmacy (with phone number or street name):  [facility] [place] [phone] (Phone) [phone] (Fax)     Agent: Please be advised that RX refills may take up to 3 business days. We ask that you follow-up with your pharmacy.

## 2019-02-26 ENCOUNTER — Other Ambulatory Visit: Payer: Self-pay | Admitting: Family Medicine

## 2019-02-26 DIAGNOSIS — F39 Unspecified mood [affective] disorder: Secondary | ICD-10-CM

## 2019-02-27 NOTE — Progress Notes (Addendum)
[facility] [address] [place], [place] [address] [phone]  Date:  02/28/2019   Name:  Yolanda Reeves   DOB:  20-Jul-1945   MRN:  [id number]  PCP:  Pearline Cables, MD    Chief Complaint: No chief complaint on file.   History of Present Illness:  Yolanda Reeves is a 73 y.o. very pleasant female patient who presents with the following:  Yolanda Reeves is here today for a virtual follow-up visit I last saw her a little over a year ago  Pt location is home. Provider is at office Pt id confirmed with 2 factors, she given consent for virtual visit today  History of obesity, hyperlipidemia, nocturnal hypoxemia, depression, hypothyroidism, vision loss Per pulmonology she has nocturnal hypoxemia but not sleep apnea, she uses oxygen at night sometimes Last seen by myself about 1 year ago for knee pain  Patient location is home, provider location is office.  Blood in urine-she is status post hysterectomy, she is a former smoker In fact should qualify for annual CT lung cancer screening Discussed with her today-at this point she prefers not to have screening, states that she would rather not have this information  Mammogram Colon cancer screening Flu shot Can suggest Shingrix Pneumonia is up-to-date  She notes increased allergy sx. Sinus pain, she is not blowing Reeves material out of her nose.  She is using a saline rinse for her nose  Cough, face hurts She has noted it for about a week now  She notes chest congestion that makes her cough but cough is dry  She is checking her temp- no fever at all  She has noted blood in her urine- light "rusty" appearance This has been present for the last 4-5 days.  She is drinking plenty of water and also cranberry juice- this seems to help However she is still seeing blood.  No dysuria  No stomach pain or nausea She does not think this is a kidney stone -does not remind her of  previous kidney stone symptoms  She is taking lamictal and also sertraline for her mood disorder Recently Kayla notes that she is a harder time focusing on task, and finishing things she has started.  She enjoys making candles for fun, and oftentimes has a hard time getting through this process. She is interested in a possible medication change-I have been refilling her medications as she did not currently have a psychiatrist.  However advised her that I would like her to see a psychiatrist for consideration of medication change. Patient Active Problem List   Diagnosis Date Noted  . Nocturnal hypoxemia 08/10/2015  . Obesity (BMI 30-39.9) 05/19/2013  . Elevated BP 08/31/2012  . Routine general medical examination at a health care facility 08/30/2012  . Chest pain, atypical 04/02/2012  . HYPERLIPIDEMIA 01/16/2009  . VITAMIN D DEFICIENCY 01/05/2009  . UNSPECIFIED SUBJECTIVE VISUAL DISTURBANCE 01/05/2009  . WART, RIGHT HAND 04/02/2008  . HYPOTHYROIDISM, POST-RADIOACTIVE IODINE 04/02/2008  . TOBACCO USE 04/02/2008  . OSTEOPENIA 04/02/2008    Past Medical History:  Diagnosis Date  . Best dystrophy 2012  . Chest pain, atypical   . Heart murmur   . HYPERLIPIDEMIA   . HYPOTHYROIDISM, POST-RADIOACTIVE IODINE   . OSTEOPENIA   . Sleep apnea   . TOBACCO USE   . UNSPECIFIED SUBJECTIVE VISUAL DISTURBANCE   . VITAMIN D DEFICIENCY   . WART, RIGHT HAND  Past Surgical History:  Procedure Laterality Date  . ABDOMINAL HYSTERECTOMY    . CARPAL TUNNEL RELEASE    . LAPAROSCOPIC OOPHERECTOMY    . TONSILLECTOMY      Social History   Tobacco Use  . Smoking status: Former Smoker    Packs/day: 1.00    Years: 50.00    Pack years: 50.00    Types: Cigarettes    Quit date: 02/05/2011    Years since quitting: 8.0  . Smokeless tobacco: Never Used  Substance Use Topics  . Alcohol use: Yes    Alcohol/week: 0.0 standard drinks    Comment: rarely  . Drug use: No    Family History  Problem  Relation Age of Onset  . Cancer Brother        thyroid  . Prostate cancer Brother   . Skin cancer Sister   . Colon cancer Paternal Grandmother   . Thyroid cancer Brother     No Known Allergies  Medication list has been reviewed and updated.  Current Outpatient Medications on File Prior to Visit  Medication Sig Dispense Refill  . azelastine (ASTELIN) 0.1 % nasal spray Place 2 sprays into both nostrils 2 (two) times daily. Use in each nostril as directed 30 mL 11  . diazepam (VALIUM) 5 MG tablet TAKE 1/2 TO 1 TABLET BY  MOUTH DAILY AS NEEDED FOR  ANXIETY 90 tablet 0  . ergocalciferol (VITAMIN D2) 50000 units capsule Take 1 capsule (50,000 Units total) by mouth once a week. Reported on 08/05/2015 12 capsule 0  . lamoTRIgine (LAMICTAL) 150 MG tablet Take 1 tablet (150 mg total) by mouth daily. 90 tablet 1  . lamoTRIgine (LAMICTAL) 150 MG tablet TAKE 1 TABLET BY MOUTH  DAILY 30 tablet 0  . levothyroxine (SYNTHROID) 125 MCG tablet TAKE 1 TABLET BY MOUTH  DAILY BEFORE BREAKFAST 30 tablet 0  . lovastatin (MEVACOR) 20 MG tablet TAKE 1 TABLET BY MOUTH AT  BEDTIME 30 tablet 0  . lovastatin (MEVACOR) 20 MG tablet TAKE 1 TABLET BY MOUTH AT  BEDTIME 90 tablet 1  . lovastatin (MEVACOR) 20 MG tablet TAKE 1 TABLET BY MOUTH AT  BEDTIME 30 tablet 0  . meloxicam (MOBIC) 7.5 MG tablet TK 1 T PO QD FOR JOINT PAIN  0  . sertraline (ZOLOFT) 100 MG tablet TAKE 1 AND 1/2 TABLETS BY  MOUTH DAILY 90 tablet 0   No current facility-administered medications on file prior to visit.     Review of Systems:  As per HPI- otherwise negative.   Physical Examination: There were no vitals filed for this visit. There were no vitals filed for this visit. There is no height or weight on file to calculate BMI. Ideal Body Weight:    Discussed with patient over telephone.  We were not able to get video to function.  She sounds well, occasional cough but no wheezing or shortness of breath Assessment and Plan: Gross  hematuria - Plan: Urine Culture, POCT urinalysis dipstick, Urine Microscopic Only  Mood disorder (HCC) - Plan: lamoTRIgine (LAMICTAL) 150 MG tablet, sertraline (ZOLOFT) 100 MG tablet  HYPOTHYROIDISM, POST-RADIOACTIVE IODINE - Plan: levothyroxine (SYNTHROID) 125 MCG tablet  Acute non-recurrent frontal sinusitis - Plan: amoxicillin (AMOXIL) 500 MG capsule  Cough - Plan: benzonatate (TESSALON) 100 MG capsule  Dyslipidemia - Plan: lovastatin (MEVACOR) 20 MG tablet  Virtual visit today for a few concerns.  Spoke to pt for approx 13 minutes today  I refilled her Lamictal and sertraline today.  Eber Jones notes  that she is having a hard time focusing, wonders about a medication change.  I have been refilling these medications for her, but advised that if she would like a change I want to get psychiatry input.  She will contact her insurance company and find out which psychiatrist is in network for her  She  noted apparent gross hematuria.  Explained that this may be a sign of bladder cancer.  I have ordered urine studies for her, asked her to bring in a sample for [place] tomorrow  Refilled her other medications-Synthroid, lovastatin She has not been in for labs in 2 years.  Advised her to please come and see [place] for an exam and labs as soon as her cough is cleared up  For cough and sinus symptoms, will treat with amoxicillin and Tessalon Perles as needed  Signed Lamar Blinks, MD  Received her urine studies 9/28- called to let her know that culture was negative.  However given gross hematuria I am going to refer her to urology for further evaluation/ possible cysto and renal CT   Color, UA yellow yellow    Clarity, UA clear clear    Glucose, UA negative mg/dL negative    Bilirubin, UA negative negative    Ketones, POC UA negative mg/dL negative    Spec Grav, UA 1.010 - 1.025 1.025    Blood, UA negative negative    pH, UA 5.0 - 8.0 5.5    Protein Ur, POC negative mg/dL negative    Urobilinogen,  UA 0.2 or 1.0 E.U./dL 0.2  0.2 R   Nitrite, UA Negative Negative    Leukocytes, UA Negative TraceAbnormal   NEGATIVE R   Resulting Agency   [facility]      Specimen Collected: 03/01/19 13:54 Last Resulted: 03/01/19 13:54      Lab Flowsheet    Order Details    View Encounter    Lab and Collection Details    Routing    Result History     R=Reference range differs from displayed range      Result Notes for Urine Microscopic Only  Notes recorded by Darreld Mclean, MD on 03/02/2019 at 12:58 PM EDT  Await urine culture and will contact pt  Contains abnormal data Urine Microscopic Only Order: [id number] Status:  Final result Visible to patient:  No (not released) Dx:  Gross hematuria  Ref Range & Units 3d ago 18yr ago  WBC, UA 0-2/hpf 3-6/hpfAbnormal     RBC / HPF 0-2/hpf none seen  7-10 R   Bacteria, UA None 1+Abnormal   FEWAbnormal  R   Squamous Epithelial / LPF   MANYAbnormal    Crystals   CA OXALATE CRYSTALSAbnormal    Resulting Agency          MICRO NUMBER: [id number]   SPECIMEN QUALITY: Adequate   Sample Source URINE   STATUS: FINAL   Result: Growth of mixed flora was isolated, suggesting probable contamination. No further testing will be performed. If clinically indicated, recollection using a method to minimize contamination, with prompt transfer to Urine Culture Transport Tube, is  recommended.

## 2019-02-28 ENCOUNTER — Encounter: Payer: Self-pay | Admitting: Family Medicine

## 2019-02-28 ENCOUNTER — Other Ambulatory Visit: Payer: Self-pay

## 2019-02-28 ENCOUNTER — Ambulatory Visit (INDEPENDENT_AMBULATORY_CARE_PROVIDER_SITE_OTHER): Payer: Medicare Other | Admitting: Family Medicine

## 2019-02-28 DIAGNOSIS — J0110 Acute frontal sinusitis, unspecified: Secondary | ICD-10-CM | POA: Diagnosis not present

## 2019-02-28 DIAGNOSIS — F39 Unspecified mood [affective] disorder: Secondary | ICD-10-CM

## 2019-02-28 DIAGNOSIS — E018 Other iodine-deficiency related thyroid disorders and allied conditions: Secondary | ICD-10-CM | POA: Diagnosis not present

## 2019-02-28 DIAGNOSIS — E785 Hyperlipidemia, unspecified: Secondary | ICD-10-CM

## 2019-02-28 DIAGNOSIS — R05 Cough: Secondary | ICD-10-CM | POA: Diagnosis not present

## 2019-02-28 DIAGNOSIS — R059 Cough, unspecified: Secondary | ICD-10-CM

## 2019-02-28 DIAGNOSIS — R310 Gross hematuria: Secondary | ICD-10-CM

## 2019-02-28 MED ORDER — LEVOTHYROXINE SODIUM 125 MCG PO TABS: ORAL_TABLET | ORAL | 0 refills | Status: DC

## 2019-02-28 MED ORDER — SERTRALINE HCL 100 MG PO TABS: 150.0000 mg | ORAL_TABLET | Freq: Every day | ORAL | 0 refills | Status: DC

## 2019-02-28 MED ORDER — LOVASTATIN 20 MG PO TABS: 20.0000 mg | ORAL_TABLET | Freq: Every day | ORAL | 3 refills | Status: DC

## 2019-02-28 MED ORDER — AMOXICILLIN 500 MG PO CAPS: 1000.0000 mg | ORAL_CAPSULE | Freq: Two times a day (BID) | ORAL | 0 refills | Status: DC

## 2019-02-28 MED ORDER — LAMOTRIGINE 150 MG PO TABS: 150.0000 mg | ORAL_TABLET | Freq: Every day | ORAL | 0 refills | Status: DC

## 2019-02-28 MED ORDER — BENZONATATE 100 MG PO CAPS: 200.0000 mg | ORAL_CAPSULE | Freq: Three times a day (TID) | ORAL | 1 refills | Status: DC | PRN

## 2019-03-01 ENCOUNTER — Other Ambulatory Visit: Payer: Self-pay

## 2019-03-01 ENCOUNTER — Other Ambulatory Visit (INDEPENDENT_AMBULATORY_CARE_PROVIDER_SITE_OTHER): Payer: Medicare Other

## 2019-03-01 DIAGNOSIS — R310 Gross hematuria: Secondary | ICD-10-CM | POA: Diagnosis not present

## 2019-03-01 LAB — POCT URINALYSIS DIP (MANUAL ENTRY)
Bilirubin, UA: NEGATIVE
Blood, UA: NEGATIVE
Glucose, UA: NEGATIVE mg/dL
Ketones, POC UA: NEGATIVE mg/dL
Nitrite, UA: NEGATIVE
Protein Ur, POC: NEGATIVE mg/dL
Spec Grav, UA: 1.025 (ref 1.010–1.025)
Urobilinogen, UA: 0.2 E.U./dL
pH, UA: 5.5 (ref 5.0–8.0)

## 2019-03-01 LAB — URINALYSIS, MICROSCOPIC ONLY: RBC / HPF: NONE SEEN (ref 0–?)

## 2019-03-02 LAB — URINE CULTURE
MICRO NUMBER:: 923420
SPECIMEN QUALITY:: ADEQUATE

## 2019-03-04 NOTE — Addendum Note (Signed)
Addended by: Lamar Blinks C on: 03/04/2019 12:05 PM   Modules accepted: Orders

## 2019-05-09 ENCOUNTER — Other Ambulatory Visit: Payer: Self-pay | Admitting: Family Medicine

## 2019-05-09 DIAGNOSIS — Z1231 Encounter for screening mammogram for malignant neoplasm of breast: Secondary | ICD-10-CM

## 2019-05-14 ENCOUNTER — Other Ambulatory Visit: Payer: Self-pay | Admitting: Family Medicine

## 2019-05-14 DIAGNOSIS — E018 Other iodine-deficiency related thyroid disorders and allied conditions: Secondary | ICD-10-CM

## 2019-05-14 DIAGNOSIS — F39 Unspecified mood [affective] disorder: Secondary | ICD-10-CM

## 2019-08-01 ENCOUNTER — Ambulatory Visit: Payer: Medicare Other | Attending: Internal Medicine

## 2019-08-01 DIAGNOSIS — Z23 Encounter for immunization: Secondary | ICD-10-CM

## 2019-08-01 NOTE — Progress Notes (Signed)
   Covid-19 Vaccination Clinic  Name:  Yolanda Reeves    MRN: [id number] DOB: 1945/09/19  08/01/2019  Yolanda Reeves was observed post Covid-19 immunization for 15 minutes without incidence. She was provided with Vaccine Information Sheet and instruction to access the V-Safe system.   Yolanda Reeves was instructed to call 911 with any severe reactions post vaccine: Marland Kitchen Difficulty breathing  . Swelling of your face and throat  . A fast heartbeat  . A bad rash all over your body  . Dizziness and weakness    Immunizations Administered    Name Date Dose VIS Date Route   Pfizer COVID-19 Vaccine 08/01/2019 12:58 PM 0.3 mL 05/17/2019 Intramuscular   Manufacturer: [employer], [employer]   Lot: [id number]   NDC: [address]-6

## 2019-08-13 ENCOUNTER — Telehealth: Payer: Self-pay | Admitting: Family Medicine

## 2019-08-13 DIAGNOSIS — F39 Unspecified mood [affective] disorder: Secondary | ICD-10-CM

## 2019-08-13 NOTE — Telephone Encounter (Signed)
Patient : Yolanda Reeves is requesting a referral to a Phychiatrist : Webb Laws, DO  [phone]

## 2019-08-14 NOTE — Telephone Encounter (Signed)
Okay, referral placed.  Please call patient and let her know She likely will need to call and make the actual appointment herself

## 2019-08-15 ENCOUNTER — Telehealth: Payer: Self-pay

## 2019-08-15 NOTE — Telephone Encounter (Signed)
Patient called in to see if Dr. Patsy Lager could send in a referral to Dr. Allena Katz Telephone number is [phone]  Fax number: [phone] Address: [address] [place] [address] [place]  [address]

## 2019-08-15 NOTE — Telephone Encounter (Signed)
Patient notified of referral and verbalized understanding.

## 2019-08-15 NOTE — Telephone Encounter (Signed)
Referral placed. Patient notified and verbalized understanding 

## 2019-08-27 ENCOUNTER — Ambulatory Visit: Payer: Medicare Other | Attending: Internal Medicine

## 2019-08-27 DIAGNOSIS — Z23 Encounter for immunization: Secondary | ICD-10-CM

## 2019-08-27 NOTE — Progress Notes (Signed)
   Covid-19 Vaccination Clinic  Name:  FRANSHESCA CHIPMAN    MRN: [id number] DOB: 02-16-1946  08/27/2019  Ms. Cheong was observed post Covid-19 immunization for 15 minutes without incident. She was provided with Vaccine Information Sheet and instruction to access the V-Safe system.   Ms. Chisenhall was instructed to call 911 with any severe reactions post vaccine: Marland Kitchen Difficulty breathing  . Swelling of face and throat  . A fast heartbeat  . A bad rash all over body  . Dizziness and weakness   Immunizations Administered    Name Date Dose VIS Date Route   Pfizer COVID-19 Vaccine 08/27/2019  9:46 AM 0.3 mL 05/17/2019 Intramuscular   Manufacturer: [employer], [employer]   Lot: [id number]   NDC: [address]-8

## 2019-09-04 NOTE — Progress Notes (Signed)
Nurse connected with patient 09/05/19 at 10:45 AM EDT by a telephone enabled telemedicine application and verified that I am speaking with the correct person using two identifiers. Patient stated full name and DOB. Patient gave permission to continue with virtual visit. Patient's location was at home and Nurse's location was at [place] office.   Subjective:   Yolanda Reeves is a 74 y.o. female who presents for an Initial Medicare Annual Wellness Visit.  Review of Systems    Cardiac Risk Factors include: advanced age (>40men, >65 women);dyslipidemia  Home Safety/Smoke Alarms: Feels safe in home. Smoke alarms in place.  Lives alone w/ 3 dogs.    Female:      Mammo-   Active order    Dexa scan-   declines     CCS- declines    Objective:     Current Medications (verified) Outpatient Encounter Medications as of 09/05/2019  Medication Sig  . azelastine (ASTELIN) 0.1 % nasal spray Place 2 sprays into both nostrils 2 (two) times daily. Use in each nostril as directed  . benzonatate (TESSALON) 100 MG capsule Take 2 capsules (200 mg total) by mouth 3 (three) times daily as needed for cough.  . diazepam (VALIUM) 5 MG tablet TAKE 1/2 TO 1 TABLET BY  MOUTH DAILY AS NEEDED FOR  ANXIETY  . ergocalciferol (VITAMIN D2) 50000 units capsule Take 1 capsule (50,000 Units total) by mouth once a week. Reported on 08/05/2015  . lamoTRIgine (LAMICTAL) 150 MG tablet TAKE 1 TABLET BY MOUTH  DAILY  . levothyroxine (SYNTHROID) 125 MCG tablet TAKE 1 TABLET BY MOUTH  DAILY  . lovastatin (MEVACOR) 20 MG tablet Take 1 tablet (20 mg total) by mouth at bedtime.  . meloxicam (MOBIC) 7.5 MG tablet TK 1 T PO QD FOR JOINT PAIN  . sertraline (ZOLOFT) 100 MG tablet TAKE 1 AND 1/2 TABLETS BY  MOUTH DAILY  . [DISCONTINUED] amoxicillin (AMOXIL) 500 MG capsule Take 2 capsules (1,000 mg total) by mouth 2 (two) times daily.  . [DISCONTINUED] lamoTRIgine (LAMICTAL) 150 MG tablet TAKE 1 TABLET BY MOUTH  DAILY  . [DISCONTINUED]  lovastatin (MEVACOR) 20 MG tablet TAKE 1 TABLET BY MOUTH AT  BEDTIME  . [DISCONTINUED] lovastatin (MEVACOR) 20 MG tablet TAKE 1 TABLET BY MOUTH AT  BEDTIME   No facility-administered encounter medications on file as of 09/05/2019.    Allergies (verified) Patient has no known allergies.   History: Past Medical History:  Diagnosis Date  . Best dystrophy 2012  . Chest pain, atypical   . Heart murmur   . HYPERLIPIDEMIA   . HYPOTHYROIDISM, POST-RADIOACTIVE IODINE   . OSTEOPENIA   . Sleep apnea   . TOBACCO USE   . UNSPECIFIED SUBJECTIVE VISUAL DISTURBANCE   . VITAMIN D DEFICIENCY   . WART, RIGHT HAND    Past Surgical History:  Procedure Laterality Date  . ABDOMINAL HYSTERECTOMY    . CARPAL TUNNEL RELEASE    . LAPAROSCOPIC OOPHERECTOMY    . TONSILLECTOMY     Family History  Problem Relation Age of Onset  . Cancer Brother        thyroid  . Prostate cancer Brother   . Skin cancer Sister   . Colon cancer Paternal Grandmother   . Thyroid cancer Brother    Social History   Socioeconomic History  . Marital status: Divorced    Spouse name: Not on file  . Number of children: Not on file  . Years of education: 54  . Highest education  level: Not on file  Occupational History  . Not on file  Tobacco Use  . Smoking status: Former Smoker    Packs/day: 1.00    Years: 50.00    Pack years: 50.00    Types: Cigarettes    Quit date: 02/05/2011    Years since quitting: 8.5  . Smokeless tobacco: Never Used  Substance and Sexual Activity  . Alcohol use: Yes    Alcohol/week: 0.0 standard drinks    Comment: rarely  . Drug use: No  . Sexual activity: Not on file  Other Topics Concern  . Not on file  Social History Narrative   Drinks 3-5 cups of caffeine daily.   Social Determinants of Health   Financial Resource Strain: Low Risk   . Difficulty of Paying Living Expenses: Not hard at all  Food Insecurity: No Food Insecurity  . Worried About Programme researcher, broadcasting/film/video in the Last Year:  Never true  . Ran Out of Food in the Last Year: Never true  Transportation Needs: No Transportation Needs  . Lack of Transportation (Medical): No  . Lack of Transportation (Non-Medical): No  Physical Activity:   . Days of Exercise per Week:   . Minutes of Exercise per Session:   Stress:   . Feeling of Stress :   Social Connections:   . Frequency of Communication with Friends and Family:   . Frequency of Social Gatherings with Friends and Family:   . Attends Religious Services:   . Active Member of Clubs or Organizations:   . Attends Banker Meetings:   Marland Kitchen Marital Status:     Tobacco Counseling Counseling given: Not Answered   Clinical Intake:     Pain : No/denies pain                  Activities of Daily Living In your present state of health, do you have any difficulty performing the following activities: 09/05/2019  Hearing? Y  Comment has hearing aids  Vision? Y  Comment legally blind  Difficulty concentrating or making decisions? N  Walking or climbing stairs? N  Dressing or bathing? N  Doing errands, shopping? Y  Comment legally blind. doesn't drive. sister and neighbor help.  Preparing Food and eating ? N  Using the Toilet? N  In the past six months, have you accidently leaked urine? N  Do you have problems with loss of bowel control? N  Managing your Medications? N  Managing your Finances? N  Housekeeping or managing your Housekeeping? N  Some recent data might be hidden     Immunizations and Health Maintenance Immunization History  Administered Date(s) Administered  . Influenza,inj,Quad PF,6+ Mos 05/16/2013  . Influenza-Unspecified 04/07/2015, 03/09/2018  . PFIZER SARS-COV-2 Vaccination 08/01/2019, 08/27/2019  . Pneumococcal Conjugate-13 08/06/2015  . Pneumococcal Polysaccharide-23 06/11/2008, 08/30/2012  . Td 06/06/2006  . Tdap 02/07/2012   Health Maintenance Due  Topic Date Due  . COLONOSCOPY  05/17/2015  . MAMMOGRAM   10/05/2017    Patient Care Team: Copland, Gwenlyn Found, MD as PCP - General (Family Medicine) Dohmeier, Porfirio Mylar, MD as Consulting Physician (Neurology) Leslye Peer, MD as Consulting Physician (Pulmonary Disease)  Indicate any recent Medical Services you may have received from other than [facility] providers in the past year (date may be approximate).     Assessment:   This is a routine wellness examination for Baker. Physical assessment deferred to PCP.   Hearing/Vision screen Unable to assess. This visit is enabled though  telemedicine due to Covid 19.   Dietary issues and exercise activities discussed: Current Exercise Habits: The patient does not participate in regular exercise at present, Exercise limited by: None identified Diet (meal preparation, eat out, water intake, caffeinated beverages, dairy products, fruits and vegetables): well balanced  Goals    . Increase physical activity      Depression Screen PHQ 2/9 Scores 09/05/2019 09/19/2016 08/06/2015 05/11/2015 12/04/2014 05/16/2013  PHQ - 2 Score 2 0 0 2 0 0  PHQ- 9 Score 8 - - 13 - -  Exception Documentation - - Other- indicate reason in comment box - - -    Fall Risk Fall Risk  09/05/2019 09/19/2016 08/06/2015 05/16/2013  Falls in the past year? 0 No Yes Yes  Number falls in past yr: 0 - 2 or more 1  Injury with Fall? 0 - No Yes  Risk Factor Category  - - High Fall Risk -  Risk for fall due to : - - Impaired vision -  Follow up Education provided;Falls prevention discussed - Falls prevention discussed -    Cognitive Function:   Ad8 score reviewed for issues:  Issues making decisions:no  Less interest in hobbies / activities:no  Repeats questions, stories (family complaining):no  Trouble using ordinary gadgets (microwave, computer, phone):no  Forgets the month or year: no  Mismanaging finances: no  Remembering appts:no  Daily problems with thinking and/or memory:no Ad8 score is=0       Screening  Tests Health Maintenance  Topic Date Due  . COLONOSCOPY  05/17/2015  . MAMMOGRAM  10/05/2017  . INFLUENZA VACCINE  01/05/2020  . TETANUS/TDAP  02/06/2022  . DEXA SCAN  Completed  . Hepatitis C Screening  Completed  . PNA vac Low Risk Adult  Completed     Plan:    Please schedule your next medicare wellness visit with me in 1 yr.  Continue to eat heart healthy diet (full of fruits, vegetables, whole grains, lean protein, water--limit salt, fat, and sugar intake) and increase physical activity as tolerated.  Continue doing brain stimulating activities to keep memory sharp.   Bring a copy of your living will and/or healthcare power of attorney to your next office visit.    I have personally reviewed and noted the following in the patient's chart:   . Medical and social history . Use of alcohol, tobacco or illicit drugs  . Current medications and supplements . Functional ability and status . Nutritional status . Physical activity . Advanced directives . List of other physicians . Hospitalizations, surgeries, and ER visits in previous 12 months . Vitals . Screenings to include cognitive, depression, and falls . Referrals and appointments  In addition, I have reviewed and discussed with patient certain preventive protocols, quality metrics, and best practice recommendations. A written personalized care plan for preventive services as well as general preventive health recommendations were provided to patient.     Avon Gully, [place]   09/05/2019

## 2019-09-05 ENCOUNTER — Encounter: Payer: Self-pay | Admitting: *Deleted

## 2019-09-05 ENCOUNTER — Ambulatory Visit (INDEPENDENT_AMBULATORY_CARE_PROVIDER_SITE_OTHER): Payer: Medicare Other | Admitting: *Deleted

## 2019-09-05 ENCOUNTER — Other Ambulatory Visit: Payer: Self-pay

## 2019-09-05 DIAGNOSIS — Z0000 Encounter for general adult medical examination without abnormal findings: Secondary | ICD-10-CM

## 2019-09-05 NOTE — Patient Instructions (Addendum)
Please schedule your next medicare wellness visit with me in 1 yr.  Continue to eat heart healthy diet (full of fruits, vegetables, whole grains, lean protein, water--limit salt, fat, and sugar intake) and increase physical activity as tolerated.  Continue doing brain stimulating activities to keep memory sharp.   Bring a copy of your living will and/or healthcare power of attorney to your next office visit.   Yolanda Reeves , Thank you for taking time to come for your Medicare Wellness Visit. I appreciate your ongoing commitment to your health goals. Please review the following plan we discussed and let me know if I can assist you in the future.   These are the goals we discussed: Goals    . Increase physical activity       This is a list of the screening recommended for you and due dates:  Health Maintenance  Topic Date Due  . Colon Cancer Screening  05/17/2015  . Mammogram  10/05/2017  . Flu Shot  01/05/2020  . Tetanus Vaccine  02/06/2022  . DEXA scan (bone density measurement)  Completed  .  Hepatitis C: One time screening is recommended by Center for Disease Control  (CDC) for  adults born from [birth year] through 1964.   Completed  . Pneumonia vaccines  Completed    Preventive Care 4 Years and Older, Female Preventive care refers to lifestyle choices and visits with your health care provider that can promote health and wellness. This includes:  A yearly physical exam. This is also called an annual well check.  Regular dental and eye exams.  Immunizations.  Screening for certain conditions.  Healthy lifestyle choices, such as diet and exercise. What can I expect for my preventive care visit? Physical exam Your health care provider will check:  Height and weight. These may be used to calculate body mass index (BMI), which is a measurement that tells if you are at a healthy weight.  Heart rate and blood pressure.  Your skin for abnormal spots. Counseling Your health care  provider may ask you questions about:  Alcohol, tobacco, and drug use.  Emotional well-being.  Home and relationship well-being.  Sexual activity.  Eating habits.  History of falls.  Memory and ability to understand (cognition).  Work and work Statistician.  Pregnancy and menstrual history. What immunizations do I need?  Influenza (flu) vaccine  This is recommended every year. Tetanus, diphtheria, and pertussis (Tdap) vaccine  You may need a Td booster every 10 years. Varicella (chickenpox) vaccine  You may need this vaccine if you have not already been vaccinated. Zoster (shingles) vaccine  You may need this after age 33. Pneumococcal conjugate (PCV13) vaccine  One dose is recommended after age 11. Pneumococcal polysaccharide (PPSV23) vaccine  One dose is recommended after age 6. Measles, mumps, and rubella (MMR) vaccine  You may need at least one dose of MMR if you were born in 1957 or later. You may also need a second dose. Meningococcal conjugate (MenACWY) vaccine  You may need this if you have certain conditions. Hepatitis A vaccine  You may need this if you have certain conditions or if you travel or work in places where you may be exposed to hepatitis A. Hepatitis B vaccine  You may need this if you have certain conditions or if you travel or work in places where you may be exposed to hepatitis B. Haemophilus influenzae type b (Hib) vaccine  You may need this if you have certain conditions. You may receive  vaccines as individual doses or as more than one vaccine together in one shot (combination vaccines). Talk with your health care provider about the risks and benefits of combination vaccines. What tests do I need? Blood tests  Lipid and cholesterol levels. These may be checked every 5 years, or more frequently depending on your overall health.  Hepatitis C test.  Hepatitis B test. Screening  Lung cancer screening. You may have this screening  every year starting at age 11 if you have a 30-pack-year history of smoking and currently smoke or have quit within the past 15 years.  Colorectal cancer screening. All adults should have this screening starting at age 95 and continuing until age 55. Your health care provider may recommend screening at age 52 if you are at increased risk. You will have tests every 1-10 years, depending on your results and the type of screening test.  Diabetes screening. This is done by checking your blood sugar (glucose) after you have not eaten for a while (fasting). You may have this done every 1-3 years.  Mammogram. This may be done every 1-2 years. Talk with your health care provider about how often you should have regular mammograms.  BRCA-related cancer screening. This may be done if you have a family history of breast, ovarian, tubal, or peritoneal cancers. Other tests  Sexually transmitted disease (STD) testing.  Bone density scan. This is done to screen for osteoporosis. You may have this done starting at age 12. Follow these instructions at home: Eating and drinking  Eat a diet that includes fresh fruits and vegetables, whole grains, lean protein, and low-fat dairy products. Limit your intake of foods with high amounts of sugar, saturated fats, and salt.  Take vitamin and mineral supplements as recommended by your health care provider.  Do not drink alcohol if your health care provider tells you not to drink.  If you drink alcohol: ? Limit how much you have to 0-1 drink a day. ? Be aware of how much alcohol is in your drink. In the [place] one drink equals one 12 oz bottle of beer (355 mL), one 5 oz glass of wine (148 mL), or one 1 oz glass of hard liquor (44 mL). Lifestyle  Take daily care of your teeth and gums.  Stay active. Exercise for at least 30 minutes on 5 or more days each week.  Do not use any products that contain nicotine or tobacco, such as cigarettes, e-cigarettes, and chewing  tobacco. If you need help quitting, ask your health care provider.  If you are sexually active, practice safe sex. Use a condom or other form of protection in order to prevent STIs (sexually transmitted infections).  Talk with your health care provider about taking a low-dose aspirin or statin. What's next?  Go to your health care provider once a year for a well check visit.  Ask your health care provider how often you should have your eyes and teeth checked.  Stay up to date on all vaccines. This information is not intended to replace advice given to you by your health care provider. Make sure you discuss any questions you have with your health care provider. Document Revised: 05/17/2018 Document Reviewed: 05/17/2018 Elsevier Patient Education  2020 [employer].

## 2019-09-18 ENCOUNTER — Other Ambulatory Visit: Payer: Self-pay | Admitting: Family Medicine

## 2019-10-15 DIAGNOSIS — H04122 Dry eye syndrome of left lacrimal gland: Secondary | ICD-10-CM | POA: Diagnosis not present

## 2019-10-15 DIAGNOSIS — H5010 Unspecified exotropia: Secondary | ICD-10-CM | POA: Diagnosis not present

## 2019-10-15 DIAGNOSIS — H2513 Age-related nuclear cataract, bilateral: Secondary | ICD-10-CM | POA: Diagnosis not present

## 2019-10-15 DIAGNOSIS — H43391 Other vitreous opacities, right eye: Secondary | ICD-10-CM | POA: Diagnosis not present

## 2019-10-15 DIAGNOSIS — H40023 Open angle with borderline findings, high risk, bilateral: Secondary | ICD-10-CM | POA: Diagnosis not present

## 2020-02-01 ENCOUNTER — Other Ambulatory Visit: Payer: Self-pay | Admitting: Family Medicine

## 2020-02-01 DIAGNOSIS — E785 Hyperlipidemia, unspecified: Secondary | ICD-10-CM

## 2020-04-13 ENCOUNTER — Telehealth: Payer: Self-pay | Admitting: Family Medicine

## 2020-04-13 NOTE — Telephone Encounter (Signed)
Called pt -she reports that yesterday her urine appeared dark though not bloody. She was not sure if she might get infection. She drank a whole lot of water, and now her urine seems to be cleared up. At this point she does not feel any else is necessary but will let me know if her symptoms return

## 2020-04-13 NOTE — Telephone Encounter (Signed)
Please if urine order is ok for patient to come in and dropp off.

## 2020-04-13 NOTE — Telephone Encounter (Signed)
Patient states she has blood in her urine , patient is wondering  For provider could order a urine test for her.  Please Advise

## 2020-04-22 ENCOUNTER — Other Ambulatory Visit: Payer: Self-pay | Admitting: Family Medicine

## 2020-04-22 DIAGNOSIS — E018 Other iodine-deficiency related thyroid disorders and allied conditions: Secondary | ICD-10-CM

## 2020-04-22 DIAGNOSIS — F39 Unspecified mood [affective] disorder: Secondary | ICD-10-CM

## 2020-05-19 ENCOUNTER — Other Ambulatory Visit: Payer: Self-pay | Admitting: Family Medicine

## 2020-05-19 DIAGNOSIS — E785 Hyperlipidemia, unspecified: Secondary | ICD-10-CM

## 2020-06-03 ENCOUNTER — Other Ambulatory Visit: Payer: Self-pay

## 2020-06-03 ENCOUNTER — Ambulatory Visit (HOSPITAL_BASED_OUTPATIENT_CLINIC_OR_DEPARTMENT_OTHER)
Admission: RE | Admit: 2020-06-03 | Discharge: 2020-06-03 | Disposition: A | Discharge status: Home / Self Care | Payer: Medicare Other | Source: Ambulatory Visit | Attending: Medical | Admitting: Medical

## 2020-06-03 ENCOUNTER — Other Ambulatory Visit: Payer: Self-pay | Admitting: Medical

## 2020-06-03 ENCOUNTER — Encounter: Payer: Self-pay | Admitting: Medical

## 2020-06-03 ENCOUNTER — Telehealth: Payer: Self-pay | Admitting: Medical

## 2020-06-03 ENCOUNTER — Ambulatory Visit (INDEPENDENT_AMBULATORY_CARE_PROVIDER_SITE_OTHER): Payer: Medicare Other | Admitting: Medical

## 2020-06-03 VITALS — BP 140/70 | HR 70 | Resp 16 | Ht 61.0 in | Wt 228.0 lb

## 2020-06-03 DIAGNOSIS — M79609 Pain in unspecified limb: Secondary | ICD-10-CM | POA: Diagnosis not present

## 2020-06-03 DIAGNOSIS — R0600 Dyspnea, unspecified: Secondary | ICD-10-CM | POA: Diagnosis not present

## 2020-06-03 DIAGNOSIS — R600 Localized edema: Secondary | ICD-10-CM | POA: Diagnosis not present

## 2020-06-03 DIAGNOSIS — M2550 Pain in unspecified joint: Secondary | ICD-10-CM

## 2020-06-03 MED ORDER — AZITHROMYCIN 250 MG PO TABS: ORAL_TABLET | ORAL | 0 refills | Status: DC

## 2020-06-03 MED ORDER — ALBUTEROL SULFATE HFA 108 (90 BASE) MCG/ACT IN AERS: 2.0000 | INHALATION_SPRAY | Freq: Four times a day (QID) | RESPIRATORY_TRACT | 0 refills | Status: DC | PRN

## 2020-06-03 NOTE — Telephone Encounter (Signed)
Sent in azithromycin to pt pharmacy.

## 2020-06-03 NOTE — Telephone Encounter (Signed)
Rx azithromycin sent to pharmacy. °

## 2020-06-03 NOTE — Patient Instructions (Signed)
You have left popliteal/calf region pain over the last week or so.  Some mild swelling/asymmetry on exam.  Will get left lower extremity stat ultrasound today.  We will follow those results.  If DVT found would need to start medication called Xarelto.  I will be calling you when I get those stat results.  Your report history of mild dyspnea with occasional wheezing when you take deep breaths.  History of bronchitis and remote history of smoking for 50 years.  Will get chest x-ray today to evaluate cardiac silhouette and see if you has any fluid.  Also assess to get any COPD type findings.  Your oxygen saturations are very good today.  Will make inhaler available for you to use if needed.  Any significant/severe shortness of breath then recommend ED evaluation.  History of arthralgias.  Described that you could use low-dose ibuprofen 200 mg in combination with low-dose Tylenol if needed.  However you could not use any NSAID over-the-counter such as ibuprofen or Aleve if you are on medication to treat DVT.  Follow-up date to be determined after imaging review.

## 2020-06-03 NOTE — Progress Notes (Signed)
Subjective:    Patient ID: Yolanda Reeves, female    DOB: [birth year], 74 y.o.   MRN: [id number]  HPI  Pt in for some left leg swelling and faint pain when flexes leg(over past 10 days). Pt states swelling to leg. Pt thinks maybe mild sob. Maybe mild wheezing. Random occasional wheeze. Bronchitis symptoms 3 weeks ago. She did not take antibiotic.   Pt has hx of cough. Pt used benzonatate for that.  Pt stopped smoking twelve years ago.   Pt also has some joint pain/chronic.    Review of Systems  Constitutional: Negative for chills, fatigue and fever.  Respiratory: Negative for cough, chest tightness, shortness of breath and wheezing.   Cardiovascular: Negative for chest pain and palpitations.  Gastrointestinal: Negative for abdominal pain, diarrhea, nausea and rectal pain.  Genitourinary: Negative for dyspareunia and dysuria.  Musculoskeletal: Negative for back pain and neck pain.  Skin: Negative for rash.  Neurological: Negative for dizziness, speech difficulty, weakness, numbness and headaches.  Hematological: Negative for adenopathy. Does not bruise/bleed easily.  Psychiatric/Behavioral: Negative for decreased concentration, sleep disturbance and suicidal ideas. The patient is not nervous/anxious.    Past Medical History:  Diagnosis Date  . Best dystrophy 2012  . Chest pain, atypical   . Heart murmur   . HYPERLIPIDEMIA   . HYPOTHYROIDISM, POST-RADIOACTIVE IODINE   . OSTEOPENIA   . Sleep apnea   . TOBACCO USE   . UNSPECIFIED SUBJECTIVE VISUAL DISTURBANCE   . VITAMIN D DEFICIENCY   . WART, RIGHT HAND      Social History   Socioeconomic History  . Marital status: Divorced    Spouse name: Not on file  . Number of children: Not on file  . Years of education: 41  . Highest education level: Not on file  Occupational History  . Not on file  Tobacco Use  . Smoking status: Former Smoker    Packs/day: 1.00    Years: 50.00    Pack years: 50.00    Types: Cigarettes     Quit date: 02/05/2011    Years since quitting: 9.3  . Smokeless tobacco: Never Used  Substance and Sexual Activity  . Alcohol use: Yes    Alcohol/week: 0.0 standard drinks    Comment: rarely  . Drug use: No  . Sexual activity: Not on file  Other Topics Concern  . Not on file  Social History Narrative   Drinks 3-5 cups of caffeine daily.   Social Determinants of Health   Financial Resource Strain: Low Risk   . Difficulty of Paying Living Expenses: Not hard at all  Food Insecurity: No Food Insecurity  . Worried About Programme researcher, broadcasting/film/video in the Last Year: Never true  . Ran Out of Food in the Last Year: Never true  Transportation Needs: No Transportation Needs  . Lack of Transportation (Medical): No  . Lack of Transportation (Non-Medical): No  Physical Activity: Not on file  Stress: Not on file  Social Connections: Not on file  Intimate Partner Violence: Not on file    Past Surgical History:  Procedure Laterality Date  . ABDOMINAL HYSTERECTOMY    . CARPAL TUNNEL RELEASE    . LAPAROSCOPIC OOPHERECTOMY    . TONSILLECTOMY      Family History  Problem Relation Age of Onset  . Cancer Brother        thyroid  . Prostate cancer Brother   . Skin cancer Sister   . Colon cancer Paternal Grandmother   .  Thyroid cancer Brother     No Known Allergies  Current Outpatient Medications on File Prior to Visit  Medication Sig Dispense Refill  . diazepam (VALIUM) 5 MG tablet TAKE 1/2 TO 1 TABLET BY  MOUTH DAILY AS NEEDED FOR  ANXIETY 90 tablet 0  . ergocalciferol (VITAMIN D2) 50000 units capsule Take 1 capsule (50,000 Units total) by mouth once a week. Reported on 08/05/2015 12 capsule 0  . lamoTRIgine (LAMICTAL) 150 MG tablet Take 1 tablet (150 mg total) by mouth daily. 90 tablet 0  . levothyroxine (SYNTHROID) 125 MCG tablet Take 1 tablet (125 mcg total) by mouth daily before breakfast. 90 tablet 0  . lovastatin (MEVACOR) 20 MG tablet TAKE 1 TABLET BY MOUTH AT  BEDTIME 30 tablet 0  .  meloxicam (MOBIC) 7.5 MG tablet TK 1 T PO QD FOR JOINT PAIN  0  . sertraline (ZOLOFT) 100 MG tablet Take 1.5 tablets (150 mg total) by mouth daily. 135 tablet 0  . azelastine (ASTELIN) 0.1 % nasal spray Place 2 sprays into both nostrils 2 (two) times daily. Use in each nostril as directed (Patient not taking: Reported on 06/03/2020) 30 mL 11  . benzonatate (TESSALON) 100 MG capsule Take 2 capsules (200 mg total) by mouth 3 (three) times daily as needed for cough. (Patient not taking: Reported on 06/03/2020) 90 capsule 1   No current facility-administered medications on file prior to visit.    BP 140/70   Pulse 70   Resp 16   Ht 5\' 1"  (1.549 m)   Wt 228 lb (103.4 kg)   SpO2 93%   BMI 43.08 kg/m      Objective:   Physical Exam  General Mental Status- Alert. General Appearance- Not in acute distress.   Skin General: Color- Normal Color. Moisture- Normal Moisture.  Neck Carotid Arteries- Normal color. Moisture- Normal Moisture. No carotid bruits. No JVD.  Chest and Lung Exam Auscultation: Breath Sounds:-Normal.  Cardiovascular Auscultation:Rythm- Regular. Murmurs & Other Heart Sounds:Auscultation of the heart reveals- No Murmurs.  Abdomen Inspection:-Inspeection Normal. Palpation/Percussion:Note:No mass. Palpation and Percussion of the abdomen reveal- Non Tender, Non Distended + BS, no rebound or guarding.  Neurologic Cranial Nerve exam:- CN III-XII intact(No nystagmus), symmetric smile. Strength:- 5/5 equal and symmetric strength both upper and lower extremities.  Lower ext- faint bilateral pretibial edema 1+ at best. Overall left leg mild swollen calf area. Popliteal pain on palpation.  Note lying supine O2 sat 96%.    Assessment & Plan:  [phone]  Home [phone].  You have left popliteal/calf region pain over the last week or so.  Some mild swelling/asymmetry on exam.  Will get left lower extremity stat ultrasound today.  We will follow those results.  If  DVT found would need to start medication called Xarelto.  I will be calling you when I get those stat results.  Your report history of mild dyspnea with occasional wheezing when you take deep breaths.  History of bronchitis and remote history of smoking for 50 years.  Will get chest x-ray today to evaluate cardiac silhouette and see if he has any fluid.  Also assess to get any COPD type findings.  Your oxygen saturations are very good today.  Will make inhaler available for you to use if needed.  Any significant/severe shortness of breath then recommend ED evaluation.  History of arthralgias.  Described that you could use low-dose ibuprofen 200 mg in combination with low-dose Tylenol if needed.  However you could not use any  NSAID over-the-counter such as ibuprofen or Aleve if you are on medication to treat DVT.  Follow-up date to be determined after imaging review.

## 2020-06-25 ENCOUNTER — Other Ambulatory Visit: Payer: Self-pay | Admitting: Family Medicine

## 2020-06-25 ENCOUNTER — Other Ambulatory Visit: Payer: Self-pay | Admitting: Medical

## 2020-06-29 ENCOUNTER — Other Ambulatory Visit: Payer: Self-pay | Admitting: Family Medicine

## 2020-06-29 DIAGNOSIS — E018 Other iodine-deficiency related thyroid disorders and allied conditions: Secondary | ICD-10-CM

## 2020-06-29 DIAGNOSIS — F39 Unspecified mood [affective] disorder: Secondary | ICD-10-CM

## 2020-07-24 ENCOUNTER — Telehealth: Payer: Self-pay | Admitting: Family Medicine

## 2020-07-24 DIAGNOSIS — R112 Nausea with vomiting, unspecified: Secondary | ICD-10-CM

## 2020-07-24 MED ORDER — ONDANSETRON HCL 4 MG PO TABS: 4.0000 mg | ORAL_TABLET | Freq: Three times a day (TID) | ORAL | 0 refills | Status: DC | PRN

## 2020-07-24 NOTE — Telephone Encounter (Signed)
Spoke with patient. She states she has been severely nauseous for 3 days. She is recovering from pneumonia. She has finished antibiotics given for pneumonia but She is still coughing as well. She is using the albuterol inhaler and that seems to help her but she just does not know what to do for the nausea. She is able to eat very light meals. She has no fever. She has had both covid vaccines however no booster. No flu shot as well.   She is wanting a medication if allowed to be sent in to [facility] on [address].

## 2020-07-24 NOTE — Telephone Encounter (Signed)
Patient states she is nauseated and would like a prescription sent in. Patient states she is getting over pneumonia and is blind.

## 2020-07-24 NOTE — Telephone Encounter (Signed)
Called pt- she states she was treated for pneumonia "a few weeks ago-" looking back she was seen on 06/03/20- she did not realize it had been that long. She denies being in any distress.  She is still coughing and may vomit at times.  I called in zofran, asked her to see [place] next week if she is still ill.  She states agreement

## 2020-09-10 ENCOUNTER — Telehealth: Payer: Self-pay

## 2020-09-10 ENCOUNTER — Ambulatory Visit (INDEPENDENT_AMBULATORY_CARE_PROVIDER_SITE_OTHER): Payer: Medicare Other

## 2020-09-10 ENCOUNTER — Other Ambulatory Visit: Payer: Self-pay | Admitting: Family

## 2020-09-10 VITALS — Ht 61.0 in | Wt 224.0 lb

## 2020-09-10 DIAGNOSIS — Z780 Asymptomatic menopausal state: Secondary | ICD-10-CM | POA: Diagnosis not present

## 2020-09-10 DIAGNOSIS — Z1231 Encounter for screening mammogram for malignant neoplasm of breast: Secondary | ICD-10-CM | POA: Diagnosis not present

## 2020-09-10 DIAGNOSIS — Z0000 Encounter for general adult medical examination without abnormal findings: Secondary | ICD-10-CM

## 2020-09-10 NOTE — Patient Instructions (Signed)
[name] , Thank you for taking time to complete your Medicare Wellness Visit. I appreciate your ongoing commitment to your health goals. Please review the following plan we discussed and let me know if I can assist you in the future.   Screening recommendations/referrals: Colonoscopy: Due- Declined today. Please call the office when you are ready to schedule. Mammogram: Ordered today. Someone will be calling you to schedule. Bone Density: Ordered today. Someone will be calling you to schedule. Recommended yearly ophthalmology/optometry visit for glaucoma screening and checkup Recommended yearly dental visit for hygiene and checkup  Vaccinations: Influenza vaccine: Due 02/2021 Pneumococcal vaccine: Completed vaccines Tdap vaccine: Up to date-Due 02/06/2022 Shingles vaccine: Discuss with pharmacy  Covid-19:Booster due-Discuss with pharmacy.  Advanced directives: Please bring a copy for your chart  Conditions/risks identified: See problem list  Next appointment: Follow up in one year for your annual wellness visit    Preventive Care 74 Years and Older, Female Preventive care refers to lifestyle choices and visits with your health care provider that can promote health and wellness. What does preventive care include?  A yearly physical exam. This is also called an annual well check.  Dental exams once or twice a year.  Routine eye exams. Ask your health care provider how often you should have your eyes checked.  Personal lifestyle choices, including:  Daily care of your teeth and gums.  Regular physical activity.  Eating a healthy diet.  Avoiding tobacco and drug use.  Limiting alcohol use.  Practicing safe sex.  Taking low-dose aspirin every day.  Taking vitamin and mineral supplements as recommended by your health care provider. What happens during an annual well check? The services and screenings done by your health care provider during your annual well check will  depend on your age, overall health, lifestyle risk factors, and family history of disease. Counseling  Your health care provider may ask you questions about your:  Alcohol use.  Tobacco use.  Drug use.  Emotional well-being.  Home and relationship well-being.  Sexual activity.  Eating habits.  History of falls.  Memory and ability to understand (cognition).  Work and work Astronomer.  Reproductive health. Screening  You may have the following tests or measurements:  Height, weight, and BMI.  Blood pressure.  Lipid and cholesterol levels. These may be checked every 5 years, or more frequently if you are over 74 years old.  Skin check.  Lung cancer screening. You may have this screening every year starting at age 74 if you have a 30-pack-year history of smoking and currently smoke or have quit within the past 15 years.  Fecal occult blood test (FOBT) of the stool. You may have this test every year starting at age 74.  Flexible sigmoidoscopy or colonoscopy. You may have a sigmoidoscopy every 5 years or a colonoscopy every 10 years starting at age 74.  Hepatitis C blood test.  Hepatitis B blood test.  Sexually transmitted disease (STD) testing.  Diabetes screening. This is done by checking your blood sugar (glucose) after you have not eaten for a while (fasting). You may have this done every 1-3 years.  Bone density scan. This is done to screen for osteoporosis. You may have this done starting at age 74.  Mammogram. This may be done every 1-2 years. Talk to your health care provider about how often you should have regular mammograms. Talk with your health care provider about your test results, treatment options, and if necessary, the need for more tests.  Vaccines  Your health care provider may recommend certain vaccines, such as:  Influenza vaccine. This is recommended every year.  Tetanus, diphtheria, and acellular pertussis (Tdap, Td) vaccine. You may need a  Td booster every 10 years.  Zoster vaccine. You may need this after age 74.  Pneumococcal 13-valent conjugate (PCV13) vaccine. One dose is recommended after age 74.  Pneumococcal polysaccharide (PPSV23) vaccine. One dose is recommended after age 74. Talk to your health care provider about which screenings and vaccines you need and how often you need them. This information is not intended to replace advice given to you by your health care provider. Make sure you discuss any questions you have with your health care provider. Document Released: 06/19/2015 Document Revised: 02/10/2016 Document Reviewed: 03/24/2015 Elsevier Interactive Patient Education  2017 [place] Prevention in the Home Falls can cause injuries. They can happen to people of all ages. There are many things you can do to make your home safe and to help prevent falls. What can I do on the outside of my home?  Regularly fix the edges of walkways and driveways and fix any cracks.  Remove anything that might make you trip as you walk through a door, such as a raised step or threshold.  Trim any bushes or trees on the path to your home.  Use bright outdoor lighting.  Clear any walking paths of anything that might make someone trip, such as rocks or tools.  Regularly check to see if handrails are loose or broken. Make sure that both sides of any steps have handrails.  Any raised decks and porches should have guardrails on the edges.  Have any leaves, snow, or ice cleared regularly.  Use sand or salt on walking paths during winter.  Clean up any spills in your garage right away. This includes oil or grease spills. What can I do in the bathroom?  Use night lights.  Install grab bars by the toilet and in the tub and shower. Do not use towel bars as grab bars.  Use non-skid mats or decals in the tub or shower.  If you need to sit down in the shower, use a plastic, non-slip stool.  Keep the floor dry. Clean  up any water that spills on the floor as soon as it happens.  Remove soap buildup in the tub or shower regularly.  Attach bath mats securely with double-sided non-slip rug tape.  Do not have throw rugs and other things on the floor that can make you trip. What can I do in the bedroom?  Use night lights.  Make sure that you have a light by your bed that is easy to reach.  Do not use any sheets or blankets that are too big for your bed. They should not hang down onto the floor.  Have a firm chair that has side arms. You can use this for support while you get dressed.  Do not have throw rugs and other things on the floor that can make you trip. What can I do in the kitchen?  Clean up any spills right away.  Avoid walking on wet floors.  Keep items that you use a lot in easy-to-reach places.  If you need to reach something above you, use a strong step stool that has a grab bar.  Keep electrical cords out of the way.  Do not use floor polish or wax that makes floors slippery. If you must use wax, use non-skid floor wax.  Do not have throw rugs and other things on the floor that can make you trip. What can I do with my stairs?  Do not leave any items on the stairs.  Make sure that there are handrails on both sides of the stairs and use them. Fix handrails that are broken or loose. Make sure that handrails are as long as the stairways.  Check any carpeting to make sure that it is firmly attached to the stairs. Fix any carpet that is loose or worn.  Avoid having throw rugs at the top or bottom of the stairs. If you do have throw rugs, attach them to the floor with carpet tape.  Make sure that you have a light switch at the top of the stairs and the bottom of the stairs. If you do not have them, ask someone to add them for you. What else can I do to help prevent falls?  Wear shoes that:  Do not have high heels.  Have rubber bottoms.  Are comfortable and fit you well.  Are  closed at the toe. Do not wear sandals.  If you use a stepladder:  Make sure that it is fully opened. Do not climb a closed stepladder.  Make sure that both sides of the stepladder are locked into place.  Ask someone to hold it for you, if possible.  Clearly mark and make sure that you can see:  Any grab bars or handrails.  First and last steps.  Where the edge of each step is.  Use tools that help you move around (mobility aids) if they are needed. These include:  Canes.  Walkers.  Scooters.  Crutches.  Turn on the lights when you go into a dark area. Replace any light bulbs as soon as they burn out.  Set up your furniture so you have a clear path. Avoid moving your furniture around.  If any of your floors are uneven, fix them.  If there are any pets around you, be aware of where they are.  Review your medicines with your doctor. Some medicines can make you feel dizzy. This can increase your chance of falling. Ask your doctor what other things that you can do to help prevent falls. This information is not intended to replace advice given to you by your health care provider. Make sure you discuss any questions you have with your health care provider. Document Released: 03/19/2009 Document Revised: 10/29/2015 Document Reviewed: 06/27/2014 Elsevier Interactive Patient Education  2017 [employer].

## 2020-09-10 NOTE — Telephone Encounter (Signed)
Patient is requesting a referral to a new psychiatrist because the one she was seeing has left. She also states that she had pneumonia back in December & still has a non productive cough & occasional shortness of breath. She wants to know if she needs to have another CXR.

## 2020-09-10 NOTE — Progress Notes (Signed)
Subjective:   Yolanda Reeves is a 75 y.o. female who presents for Medicare Annual (Subsequent) preventive examination.  I connected with Khali today by telephone and verified that I am speaking with the correct person using two identifiers. Location patient: home Location provider: work Persons participating in the virtual visit: patient, Engineer, civil (consulting).    I discussed the limitations, risks, security and privacy concerns of performing an evaluation and management service by telephone and the availability of in person appointments. I also discussed with the patient that there may be a patient responsible charge related to this service. The patient expressed understanding and verbally consented to this telephonic visit.    Interactive audio and video telecommunications were attempted between this provider and patient, however failed, due to patient having technical difficulties OR patient did not have access to video capability.  We continued and completed visit with audio only.  Some vital signs may be absent or patient reported.   Time Spent with patient on telephone encounter: 25 minutes   Review of Systems     Cardiac Risk Factors include: advanced age (>29men, >85 women);dyslipidemia;obesity (BMI >30kg/m2);sedentary lifestyle     Objective:    Today's Vitals   09/10/20 1238  Weight: 224 lb (101.6 kg)  Height:  (1.549 m)   Body mass index is 42.32 kg/m.  Advanced Directives 09/10/2020 09/05/2019  Does Patient Have a Medical Advance Directive? Yes Yes  Type of Advance Directive Living will Living will  Does patient want to make changes to medical advance directive? - No - Patient declined    Current Medications (verified) Outpatient Encounter Medications as of 09/10/2020  Medication Sig  . albuterol (VENTOLIN HFA) 108 (90 Base) MCG/ACT inhaler INHALE 2 PUFFS INTO THE LUNGS EVERY 6 HOURS AS NEEDED FOR WHEEZING OR SHORTNESS OF BREATH  . diazepam (VALIUM) 5 MG tablet TAKE 1/2 TO  1 TABLET BY  MOUTH DAILY AS NEEDED FOR  ANXIETY  . ergocalciferol (VITAMIN D2) 50000 units capsule Take 1 capsule (50,000 Units total) by mouth once a week. Reported on 08/05/2015  . lamoTRIgine (LAMICTAL) 150 MG tablet TAKE 1 TABLET BY MOUTH  DAILY  . levothyroxine (SYNTHROID) 125 MCG tablet TAKE 1 TABLET BY MOUTH  DAILY BEFORE BREAKFAST  . lovastatin (MEVACOR) 20 MG tablet TAKE 1 TABLET BY MOUTH AT  BEDTIME  . meloxicam (MOBIC) 7.5 MG tablet TK 1 T PO QD FOR JOINT PAIN  . ondansetron (ZOFRAN) 4 MG tablet Take 1 tablet (4 mg total) by mouth every 8 (eight) hours as needed for nausea or vomiting.  . sertraline (ZOLOFT) 100 MG tablet TAKE 1 AND 1/2 TABLETS BY  MOUTH DAILY  . azelastine (ASTELIN) 0.1 % nasal spray Place 2 sprays into both nostrils 2 (two) times daily. Use in each nostril as directed (Patient not taking: No sig reported)  . benzonatate (TESSALON) 100 MG capsule Take 2 capsules (200 mg total) by mouth 3 (three) times daily as needed for cough. (Patient not taking: No sig reported)  . [DISCONTINUED] albuterol (VENTOLIN HFA) 108 (90 Base) MCG/ACT inhaler Inhale 2 puffs into the lungs every 6 (six) hours as needed for wheezing or shortness of breath.   No facility-administered encounter medications on file as of 09/10/2020.    Allergies (verified) Patient has no known allergies.   History: Past Medical History:  Diagnosis Date  . Best dystrophy 2012  . Chest pain, atypical   . Heart murmur   . HYPERLIPIDEMIA   . HYPOTHYROIDISM, POST-RADIOACTIVE IODINE   .  OSTEOPENIA   . Sleep apnea   . TOBACCO USE   . UNSPECIFIED SUBJECTIVE VISUAL DISTURBANCE   . VITAMIN D DEFICIENCY   . WART, RIGHT HAND    Past Surgical History:  Procedure Laterality Date  . ABDOMINAL HYSTERECTOMY    . CARPAL TUNNEL RELEASE    . LAPAROSCOPIC OOPHERECTOMY    . TONSILLECTOMY     Family History  Problem Relation Age of Onset  . Cancer Brother        thyroid  . Prostate cancer Brother   . Skin cancer  Sister   . Colon cancer Paternal Grandmother   . Thyroid cancer Brother    Social History   Socioeconomic History  . Marital status: Divorced    Spouse name: Not on file  . Number of children: Not on file  . Years of education: 61  . Highest education level: Not on file  Occupational History  . Not on file  Tobacco Use  . Smoking status: Former Smoker    Packs/day: 1.00    Years: 50.00    Pack years: 50.00    Types: Cigarettes    Quit date: 02/05/2011    Years since quitting: 9.6  . Smokeless tobacco: Never Used  Substance and Sexual Activity  . Alcohol use: Yes    Alcohol/week: 0.0 standard drinks    Comment: rarely  . Drug use: No  . Sexual activity: Not on file  Other Topics Concern  . Not on file  Social History Narrative   Drinks 3-5 cups of caffeine daily.   Social Determinants of Health   Financial Resource Strain: Low Risk   . Difficulty of Paying Living Expenses: Not hard at all  Food Insecurity: No Food Insecurity  . Worried About Programme researcher, broadcasting/film/video in the Last Year: Never true  . Ran Out of Food in the Last Year: Never true  Transportation Needs: No Transportation Needs  . Lack of Transportation (Medical): No  . Lack of Transportation (Non-Medical): No  Physical Activity: Inactive  . Days of Exercise per Week: 0 days  . Minutes of Exercise per Session: 0 min  Stress: No Stress Concern Present  . Feeling of Stress : Not at all  Social Connections: Moderately Isolated  . Frequency of Communication with Friends and Family: More than three times a week  . Frequency of Social Gatherings with Friends and Family: More than three times a week  . Attends Religious Services: 1 to 4 times per year  . Active Member of Clubs or Organizations: No  . Attends Banker Meetings: Never  . Marital Status: Divorced    Tobacco Counseling Counseling given: Not Answered   Clinical Intake:  Pre-visit preparation completed: Yes  Pain : No/denies pain      Nutritional Status: BMI > 30  Obese Nutritional Risks: None Diabetes: No  How often do you need to have someone help you when you read instructions, pamphlets, or other written materials from your doctor or pharmacy?: 1 - Never  Diabetic?No  Interpreter Needed?: No  Information entered by :: Thomasenia Sales LPN   Activities of Daily Living In your present state of health, do you have any difficulty performing the following activities: 09/10/2020  Hearing? Y  Comment hearing aids  Vision? Y  Comment legally blind  Difficulty concentrating or making decisions? Y  Walking or climbing stairs? N  Dressing or bathing? N  Doing errands, shopping? Y  Preparing Food and eating ? N  Using  the Toilet? N  In the past six months, have you accidently leaked urine? Y  Do you have problems with loss of bowel control? N  Managing your Medications? N  Managing your Finances? N  Housekeeping or managing your Housekeeping? N  Some recent data might be hidden    Patient Care Team: Copland, Gwenlyn Found, MD as PCP - General (Family Medicine) Dohmeier, Porfirio Mylar, MD as Consulting Physician (Neurology) Leslye Peer, MD as Consulting Physician (Pulmonary Disease)  Indicate any recent Medical Services you may have received from other than [facility] providers in the past year (date may be approximate).     Assessment:   This is a routine wellness examination for [place].  Hearing/Vision screen  Hearing Screening   125Hz  250Hz  500Hz  1000Hz  2000Hz  3000Hz  4000Hz  6000Hz  8000Hz   Right ear:           Left ear:           Comments: Wears hearing aids  Vision Screening Comments: Patient is legally blind Last eye exam-11/2019-Dr.  Dietary issues and exercise activities discussed: Current Exercise Habits: The patient does not participate in regular exercise at present, Exercise limited by: Other - see comments (pt is legally blind)  Goals    . Increase physical activity    . Patient Stated      Eat healthier meals      Depression Screen PHQ 2/9 Scores 09/10/2020 09/05/2019 09/19/2016 08/06/2015 05/11/2015 12/04/2014 05/16/2013  PHQ - 2 Score 1 2 0 0 2 0 0  PHQ- 9 Score - 8 - - 13 - -  Exception Documentation - - - Other- indicate reason in comment box - - -    Fall Risk Fall Risk  09/10/2020 09/05/2019 09/19/2016 08/06/2015 05/16/2013  Falls in the past year? 1 0 No Yes Yes  Number falls in past yr: 1 0 - 2 or more 1  Injury with Fall? 0 0 - No Yes  Risk Factor Category  - - - High Fall Risk -  Risk for fall due to : History of fall(s);Impaired vision - - Impaired vision -  Follow up Falls prevention discussed Education provided;Falls prevention discussed - Falls prevention discussed -    FALL RISK PREVENTION PERTAINING TO THE HOME:  Any stairs in or around the home? No  Home free of loose throw rugs in walkways, pet beds, electrical cords, etc? Yes  Adequate lighting in your home to reduce risk of falls? Yes   ASSISTIVE DEVICES UTILIZED TO PREVENT FALLS:  Life alert? No  Use of a cane, walker or w/c? Yes for blindness Grab bars in the bathroom? Yes  Shower chair or bench in shower? No  Elevated toilet seat or a handicapped toilet? No   TIMED UP AND GO:  Was the test performed? No . Phone visit   Cognitive Function:Normal cognitive status assessed by this Nurse Health Advisor. No abnormalities found.      6CIT Screen 09/10/2020  What Year? 0 points  What month? 0 points  What time? 0 points  Count back from 20 0 points  Months in reverse 0 points  Repeat phrase 0 points  Total Score 0    Immunizations Immunization History  Administered Date(s) Administered  . Influenza, High Dose Seasonal PF 03/09/2018  . Influenza,inj,Quad PF,6+ Mos 05/16/2013  . Influenza-Unspecified 04/07/2015, 03/09/2018  . PFIZER(Purple Top)SARS-COV-2 Vaccination 08/01/2019, 08/27/2019  . Pneumococcal Conjugate-13 08/06/2015  . Pneumococcal Polysaccharide-23 06/11/2008, 08/30/2012  . Td  06/06/2006  . Tdap 02/07/2012  TDAP status: Up to date  Flu Vaccine status: Declined, Education has been provided regarding the importance of this vaccine but patient still declined. Advised may receive this vaccine at local pharmacy or Health Dept. Aware to provide a copy of the vaccination record if obtained from local pharmacy or Health Dept. Verbalized acceptance and understanding.  Pneumococcal vaccine status: Up to date  Covid-19 vaccine status: Information provided on how to obtain vaccines. Booster due  Qualifies for Shingles Vaccine? Yes   Zostavax completed No   Shingrix Completed?: No.    Education has been provided regarding the importance of this vaccine. Patient has been advised to call insurance company to determine out of pocket expense if they have not yet received this vaccine. Advised may also receive vaccine at local pharmacy or Health Dept. Verbalized acceptance and understanding.  Screening Tests Health Maintenance  Topic Date Due  . COLONOSCOPY (Pts 45-263yrs Insurance coverage will need to be confirmed)  05/17/2015  . MAMMOGRAM  10/05/2017  . COVID-19 Vaccine (3 - Booster for Pfizer series) 02/27/2020  . INFLUENZA VACCINE  01/04/2021  . TETANUS/TDAP  02/06/2022  . DEXA SCAN  Completed  . Hepatitis C Screening  Completed  . PNA vac Low Risk Adult  Completed  . HPV VACCINES  Aged Out    Health Maintenance  Health Maintenance Due  Topic Date Due  . COLONOSCOPY (Pts [id number] Insurance coverage will need to be confirmed)  05/17/2015  . MAMMOGRAM  10/05/2017  . COVID-19 Vaccine (3 - Booster for Pfizer series) 02/27/2020    Colorectal cancer screening:Due-Declined today  Mammogram status: Ordered today. Pt provided with contact info and advised to call to schedule appt.   Bone Density status: Ordered today. Pt provided with contact info and advised to call to schedule appt.  Lung Cancer Screening: (Low Dose CT Chest recommended if Age 85-80 years, 30  pack-year currently smoking OR have quit w/in 15years.) does qualify.   Lung Cancer Screening Referral: Declined  Additional Screening:  Hepatitis C Screening:Completed 08/06/2015  Vision Screening: Recommended annual ophthalmology exams for early detection of glaucoma and other disorders of the eye. Is the patient up to date with their annual eye exam?  Yes  Who is the provider or what is the name of the office in which the patient attends annual eye exams? Dr. Jarold MottoPatterson   Dental Screening: Recommended annual dental exams for proper oral hygiene  Community Resource Referral / Chronic Care Management: CRR required this visit?  No   CCM required this visit?  No      Plan:     I have personally reviewed and noted the following in the patient's chart:   . Medical and social history . Use of alcohol, tobacco or illicit drugs  . Current medications and supplements . Functional ability and status . Nutritional status . Physical activity . Advanced directives . List of other physicians . Hospitalizations, surgeries, and ER visits in previous 12 months . Vitals . Screenings to include cognitive, depression, and falls . Referrals and appointments  In addition, I have reviewed and discussed with patient certain preventive protocols, quality metrics, and best practice recommendations. A written personalized care plan for preventive services as well as general preventive health recommendations were provided to patient.   Due to this being a telephonic visit, the after visit summary with patients personalized plan was offered to patient via mail or my-chart. Per request, patient was mailed a copy of AVS.   Roanna RaiderMartha A Kaydense Rizo, LPN  09/10/2020  Nurse Health Advisor  Nurse Notes: None

## 2020-09-11 NOTE — Telephone Encounter (Signed)
Spoke with patient, gave her numbers to facility PCP suggested. She declines needing a visit right now. She states she will call us if she needs anything and appreciates [place].

## 2020-09-11 NOTE — Telephone Encounter (Signed)
Please give her a call back.  I am sorry that she lost her psychiatrist!  Most psychiatrists actually won't take a referral from [place]. The patient has to call on their own.  If she needs an idea for an office I might suggest  [facility]- [phone]  [phone] [facility]  [phone]   Does she want to se me next week, I can listen to her and get a chest film if needed   If any distress or significant SOB please seek immediate help  Thank you Fredric Mare!

## 2020-10-01 ENCOUNTER — Other Ambulatory Visit: Payer: Self-pay | Admitting: Family Medicine

## 2020-10-01 DIAGNOSIS — Z1231 Encounter for screening mammogram for malignant neoplasm of breast: Secondary | ICD-10-CM

## 2020-10-01 DIAGNOSIS — Z780 Asymptomatic menopausal state: Secondary | ICD-10-CM

## 2020-10-05 ENCOUNTER — Ambulatory Visit: Payer: Medicare Other | Admitting: Medical

## 2020-10-05 ENCOUNTER — Emergency Department (HOSPITAL_COMMUNITY): Payer: Medicare Other

## 2020-10-05 ENCOUNTER — Encounter (HOSPITAL_COMMUNITY): Payer: Self-pay

## 2020-10-05 ENCOUNTER — Emergency Department (HOSPITAL_COMMUNITY)
Admission: EM | Admit: 2020-10-05 | Discharge: 2020-10-05 | Disposition: A | Discharge status: Home / Self Care | Payer: Medicare Other | Attending: Emergency Medicine | Admitting: Emergency Medicine

## 2020-10-05 DIAGNOSIS — N130 Hydronephrosis with ureteropelvic junction obstruction: Secondary | ICD-10-CM | POA: Insufficient documentation

## 2020-10-05 DIAGNOSIS — N23 Unspecified renal colic: Secondary | ICD-10-CM | POA: Insufficient documentation

## 2020-10-05 DIAGNOSIS — R1031 Right lower quadrant pain: Secondary | ICD-10-CM | POA: Diagnosis not present

## 2020-10-05 DIAGNOSIS — R109 Unspecified abdominal pain: Secondary | ICD-10-CM | POA: Diagnosis not present

## 2020-10-05 DIAGNOSIS — E039 Hypothyroidism, unspecified: Secondary | ICD-10-CM | POA: Diagnosis not present

## 2020-10-05 DIAGNOSIS — Z87891 Personal history of nicotine dependence: Secondary | ICD-10-CM | POA: Insufficient documentation

## 2020-10-05 LAB — COMPREHENSIVE METABOLIC PANEL
ALT: 17 U/L (ref 0–44)
AST: 18 U/L (ref 15–41)
Albumin: 4 g/dL (ref 3.5–5.0)
Alkaline Phosphatase: 71 U/L (ref 38–126)
Anion gap: 9 (ref 5–15)
BUN: 13 mg/dL (ref 8–23)
CO2: 27 mmol/L (ref 22–32)
Calcium: 9.4 mg/dL (ref 8.9–10.3)
Chloride: 108 mmol/L (ref 98–111)
Creatinine, Ser: 0.87 mg/dL (ref 0.44–1.00)
GFR, Estimated: >60 mL/min (ref >60)
Glucose, Bld: 111 mg/dL — ABNORMAL HIGH (ref 70–99)
Potassium: 4 mmol/L (ref 3.5–5.1)
Sodium: 144 mmol/L (ref 135–145)
Total Bilirubin: 0.4 mg/dL (ref 0.3–1.2)
Total Protein: 7.5 g/dL (ref 6.5–8.1)

## 2020-10-05 LAB — CBC
HCT: 46.7 % — ABNORMAL HIGH (ref 36.0–46.0)
Hemoglobin: 14.9 g/dL (ref 12.0–15.0)
MCH: 29.2 pg (ref 26.0–34.0)
MCHC: 31.9 g/dL (ref 30.0–36.0)
MCV: 91.4 fL (ref 80.0–100.0)
Platelets: 252 10*3/uL (ref 150–400)
RBC: 5.11 MIL/uL (ref 3.87–5.11)
RDW: 14 % (ref 11.5–15.5)
WBC: 11.5 10*3/uL — ABNORMAL HIGH (ref 4.0–10.5)
nRBC: 0 % (ref 0.0–0.2)

## 2020-10-05 LAB — URINALYSIS, ROUTINE W REFLEX MICROSCOPIC
Bacteria, UA: NONE SEEN
Bilirubin Urine: NEGATIVE
Glucose, UA: NEGATIVE mg/dL
Ketones, ur: NEGATIVE mg/dL
Leukocytes,Ua: NEGATIVE
Nitrite: NEGATIVE
Protein, ur: NEGATIVE mg/dL
RBC / HPF: >50 RBC/hpf — ABNORMAL HIGH (ref 0–5)
Specific Gravity, Urine: 1.013 (ref 1.005–1.030)
pH: 5 (ref 5.0–8.0)

## 2020-10-05 LAB — LIPASE, BLOOD: Lipase: 34 U/L (ref 11–51)

## 2020-10-05 MED ORDER — ONDANSETRON HCL 4 MG/2ML IJ SOLN
4.0000 mg | Freq: Once | INTRAMUSCULAR | Status: AC
  Administered 2020-10-05: 4 mg via INTRAVENOUS
  Filled 2020-10-05: qty 2

## 2020-10-05 MED ORDER — MORPHINE SULFATE (PF) 4 MG/ML IV SOLN
4.0000 mg | Freq: Once | INTRAVENOUS | Status: AC
Start: 2020-10-05 — End: 2020-10-05
  Administered 2020-10-05: 4 mg via INTRAVENOUS
  Filled 2020-10-05: qty 1

## 2020-10-05 MED ORDER — KETOROLAC TROMETHAMINE 15 MG/ML IJ SOLN
15.0000 mg | Freq: Once | INTRAMUSCULAR | Status: AC
  Administered 2020-10-05: 15 mg via INTRAVENOUS
  Filled 2020-10-05: qty 1

## 2020-10-05 MED ORDER — ONDANSETRON 4 MG PO TBDP: 4.0000 mg | ORAL_TABLET | Freq: Three times a day (TID) | ORAL | 0 refills | Status: DC | PRN

## 2020-10-05 MED ORDER — SODIUM CHLORIDE 0.9 % IV BOLUS
500.0000 mL | Freq: Once | INTRAVENOUS | Status: AC
  Administered 2020-10-05: 500 mL via INTRAVENOUS

## 2020-10-05 MED ORDER — OXYCODONE-ACETAMINOPHEN 5-325 MG PO TABS: 1.0000 | ORAL_TABLET | Freq: Four times a day (QID) | ORAL | 0 refills | Status: DC | PRN

## 2020-10-05 MED ORDER — IOHEXOL 300 MG/ML  SOLN
100.0000 mL | Freq: Once | INTRAMUSCULAR | Status: AC | PRN
  Administered 2020-10-05: 100 mL via INTRAVENOUS

## 2020-10-05 MED ORDER — MORPHINE SULFATE (PF) 4 MG/ML IV SOLN
4.0000 mg | Freq: Once | INTRAVENOUS | Status: AC
Start: 2020-10-05 — End: 2020-10-05
  Administered 2020-10-05: 4 mg via INTRAVENOUS
  Filled 2020-10-05 (×2): qty 1

## 2020-10-05 NOTE — ED Provider Notes (Signed)
[facility]-EMERGENCY DEPT Provider Note   CSN: [id number] Arrival date & time: 10/05/20  1207     History Chief Complaint  Patient presents with  . Abdominal Pain    Yolanda Reeves is a 75 y.o. female.  75 year old female with prior medical history as detailed below presents for evaluation of right flank pain.  Patient reports onset of pain over the course of the last 12 to 24 hours.  Patient reports nausea with same.  She denies fever.  She denies change in bowel movement.  The history is provided by the patient and medical records.  Flank Pain This is a new problem. The current episode started 12 to 24 hours ago. The problem occurs constantly. The problem has not changed since onset.Pertinent negatives include no chest pain and no abdominal pain. Nothing aggravates the symptoms. Nothing relieves the symptoms.       Past Medical History:  Diagnosis Date  . Best dystrophy 2012  . Chest pain, atypical   . Heart murmur   . HYPERLIPIDEMIA   . HYPOTHYROIDISM, POST-RADIOACTIVE IODINE   . OSTEOPENIA   . Sleep apnea   . TOBACCO USE   . UNSPECIFIED SUBJECTIVE VISUAL DISTURBANCE   . VITAMIN D DEFICIENCY   . WART, RIGHT HAND     Patient Active Problem List   Diagnosis Date Noted  . Nocturnal hypoxemia 08/10/2015  . Obesity (BMI 30-39.9) 05/19/2013  . Elevated BP 08/31/2012  . Routine general medical examination at a health care facility 08/30/2012  . Chest pain, atypical 04/02/2012  . HYPERLIPIDEMIA 01/16/2009  . VITAMIN D DEFICIENCY 01/05/2009  . UNSPECIFIED SUBJECTIVE VISUAL DISTURBANCE 01/05/2009  . WART, RIGHT HAND 04/02/2008  . HYPOTHYROIDISM, POST-RADIOACTIVE IODINE 04/02/2008  . TOBACCO USE 04/02/2008  . OSTEOPENIA 04/02/2008    Past Surgical History:  Procedure Laterality Date  . ABDOMINAL HYSTERECTOMY    . CARPAL TUNNEL RELEASE    . LAPAROSCOPIC OOPHERECTOMY    . TONSILLECTOMY       OB History   No obstetric history on file.      Family History  Problem Relation Age of Onset  . Cancer Brother        thyroid  . Prostate cancer Brother   . Skin cancer Sister   . Colon cancer Paternal Grandmother   . Thyroid cancer Brother     Social History   Tobacco Use  . Smoking status: Former Smoker    Packs/day: 1.00    Years: 50.00    Pack years: 50.00    Types: Cigarettes    Quit date: 02/05/2011    Years since quitting: 9.6  . Smokeless tobacco: Never Used  Substance Use Topics  . Alcohol use: Yes    Alcohol/week: 0.0 standard drinks    Comment: rarely  . Drug use: No    Home Medications Prior to Admission medications   Medication Sig Start Date End Date Taking? Authorizing Provider  ondansetron (ZOFRAN ODT) 4 MG disintegrating tablet Take 1 tablet (4 mg total) by mouth every 8 (eight) hours as needed for nausea or vomiting. 10/05/20  Yes Wynetta Fines, MD  oxyCODONE-acetaminophen (PERCOCET/ROXICET) 5-325 MG tablet Take 1 tablet by mouth every 6 (six) hours as needed for severe pain. 10/05/20  Yes Wynetta Fines, MD  albuterol (VENTOLIN HFA) 108 (90 Base) MCG/ACT inhaler INHALE 2 PUFFS INTO THE LUNGS EVERY 6 HOURS AS NEEDED FOR WHEEZING OR SHORTNESS OF BREATH 09/10/20   Sandford Craze, NP  azelastine (ASTELIN) 0.1 %  nasal spray Place 2 sprays into both nostrils 2 (two) times daily. Use in each nostril as directed Patient not taking: No sig reported 08/10/15   Leslye PeerByrum, Robert S, MD  benzonatate (TESSALON) 100 MG capsule Take 2 capsules (200 mg total) by mouth 3 (three) times daily as needed for cough. Patient not taking: No sig reported 02/28/19   Copland, Gwenlyn FoundJessica C, MD  diazepam (VALIUM) 5 MG tablet TAKE 1/2 TO 1 TABLET BY  MOUTH DAILY AS NEEDED FOR  ANXIETY 09/18/19   Copland, Gwenlyn FoundJessica C, MD  ergocalciferol (VITAMIN D2) 50000 units capsule Take 1 capsule (50,000 Units total) by mouth once a week. Reported on 08/05/2015 09/25/16   Copland, Gwenlyn FoundJessica C, MD  lamoTRIgine (LAMICTAL) 150 MG tablet TAKE 1 TABLET BY MOUTH   DAILY 06/30/20   Bradd CanaryBlyth, Stacey A, MD  levothyroxine (SYNTHROID) 125 MCG tablet TAKE 1 TABLET BY MOUTH  DAILY BEFORE BREAKFAST 06/30/20   Bradd CanaryBlyth, Stacey A, MD  lovastatin (MEVACOR) 20 MG tablet TAKE 1 TABLET BY MOUTH AT  BEDTIME 02/03/20   Copland, Gwenlyn FoundJessica C, MD  meloxicam (MOBIC) 7.5 MG tablet TK 1 T PO QD FOR JOINT PAIN 12/20/17   [provider]  ondansetron (ZOFRAN) 4 MG tablet Take 1 tablet (4 mg total) by mouth every 8 (eight) hours as needed for nausea or vomiting. 07/24/20   Copland, Gwenlyn FoundJessica C, MD  sertraline (ZOLOFT) 100 MG tablet TAKE 1 AND 1/2 TABLETS BY  MOUTH DAILY 06/30/20   Bradd CanaryBlyth, Stacey A, MD    Allergies    Patient has no known allergies.  Review of Systems   Review of Systems  Cardiovascular: Negative for chest pain.  Gastrointestinal: Negative for abdominal pain.  Genitourinary: Positive for flank pain.  All other systems reviewed and are negative.   Physical Exam Updated Vital Signs BP 139/83   Pulse 78   Temp 98 F (36.7 C) (Oral)   Resp 18   SpO2 93%   Physical Exam Vitals and nursing note reviewed.  Constitutional:      General: She is not in acute distress.    Appearance: She is well-developed.  HENT:     Head: Normocephalic and atraumatic.  Eyes:     Conjunctiva/sclera: Conjunctivae normal.     Pupils: Pupils are equal, round, and reactive to light.  Cardiovascular:     Rate and Rhythm: Normal rate and regular rhythm.     Heart sounds: Normal heart sounds.  Pulmonary:     Effort: Pulmonary effort is normal. No respiratory distress.     Breath sounds: Normal breath sounds.  Abdominal:     General: There is no distension.     Palpations: Abdomen is soft.     Tenderness: There is no abdominal tenderness.  Genitourinary:    Comments: Mild right cvat  Musculoskeletal:        General: No deformity. Normal range of motion.     Cervical back: Normal range of motion and neck supple.  Skin:    General: Skin is warm and dry.  Neurological:      Mental Status: She is alert and oriented to person, place, and time.     ED Results / Procedures / Treatments   Labs (all labs ordered are listed, but only abnormal results are displayed) Labs Reviewed  COMPREHENSIVE METABOLIC PANEL - Abnormal; Notable for the following components:      Result Value   Glucose, Bld 111 (*)    All other components within normal limits  CBC -  Abnormal; Notable for the following components:   WBC 11.5 (*)    HCT 46.7 (*)    All other components within normal limits  URINALYSIS, ROUTINE W REFLEX MICROSCOPIC - Abnormal; Notable for the following components:   Hgb urine dipstick LARGE (*)    RBC / HPF >50 (*)    All other components within normal limits  LIPASE, BLOOD    EKG EKG Interpretation  Date/Time:  Monday Oct 05 2020 12:56:57 EDT Ventricular Rate:  64 PR Interval:  163 QRS Duration: 101 QT Interval:  420 QTC Calculation: 434 R Axis:   55 Text Interpretation: Sinus rhythm Abnormal R-wave progression, early transition Confirmed by Kristine Royal [phone]) on 10/05/2020 12:58:39 PM   Radiology CT Abdomen Pelvis W Contrast  Result Date: 10/05/2020 CLINICAL DATA:  Acute abdominal pain.  Right lower quadrant pain. EXAM: CT ABDOMEN AND PELVIS WITH CONTRAST TECHNIQUE: Multidetector CT imaging of the abdomen and pelvis was performed using the standard protocol following bolus administration of intravenous contrast. CONTRAST:  OMNIPAQUE IOHEXOL 300 MG/ML  SOLN COMPARISON:  07/29/2007 FINDINGS: Lower chest: No acute abnormality. Hepatobiliary: No focal liver abnormality is seen. No gallstones, gallbladder wall thickening, or biliary dilatation. Pancreas: Unremarkable. No pancreatic ductal dilatation or surrounding inflammatory changes. Spleen: Normal in size without focal abnormality. Adrenals/Urinary Tract: Normal adrenal glands. 10 mm right UPJ calculus. Mild right hydronephrosis. Multiple bilateral renal calculi. Normal bladder. Stomach/Bowel:  Stomach is within normal limits. Appendix appears normal. No evidence of bowel wall thickening, distention, or inflammatory changes. Diverticulosis without evidence of diverticulitis. Vascular/Lymphatic: Normal caliber abdominal aorta with mild atherosclerosis. No lymphadenopathy. Reproductive: Status post hysterectomy. No adnexal masses. Other: No abdominal wall hernia or abnormality. No abdominopelvic ascites. Musculoskeletal: No acute osseous abnormality. No aggressive osseous lesion. Degenerative disease with disc height loss at L4-5 with a broad-based disc osteophyte complex, bilateral facet arthropathy and bilateral foraminal stenosis. Mild chronic L4 vertebral body compression fracture. IMPRESSION: 1. A 10 mm right UVJ calculus with mild right hydronephrosis. 2. Bilateral nonobstructing renal calculi. 3. Diverticulosis without evidence of diverticulitis. 4.  Aortic Atherosclerosis (ICD10-I70.0). Electronically Signed   By: Elige Ko   On: 10/05/2020 15:01    Procedures Procedures   Medications Ordered in ED Medications  ketorolac (TORADOL) 15 MG/ML injection 15 mg (has no administration in time range)  morphine 4 MG/ML injection 4 mg (has no administration in time range)  ondansetron (ZOFRAN) injection 4 mg (has no administration in time range)  sodium chloride 0.9 % bolus 500 mL (0 mLs Intravenous Stopped 10/05/20 1430)  morphine 4 MG/ML injection 4 mg (4 mg Intravenous Given 10/05/20 1248)  ondansetron (ZOFRAN) injection 4 mg (4 mg Intravenous Given 10/05/20 1248)  iohexol (OMNIPAQUE) 300 MG/ML solution 100 mL (100 mLs Intravenous Contrast Given 10/05/20 1342)    ED Course  I have reviewed the triage vital signs and the nursing notes.  Pertinent labs & imaging results that were available during my care of the patient were reviewed by me and considered in my medical decision making (see chart for details).    MDM Rules/Calculators/A&P                          MDM  MSE  complete  KEMISHA BONNETTE was evaluated in Emergency Department on 10/05/2020 for the symptoms described in the history of present illness. She was evaluated in the context of the global COVID-19 pandemic, which necessitated consideration that the patient might be at  risk for infection with the SARS-CoV-2 virus that causes COVID-19. Institutional protocols and algorithms that pertain to the evaluation of patients at risk for COVID-19 are in a state of rapid change based on information released by regulatory bodies including the CDC and federal and state organizations. These policies and algorithms were followed during the patient's care in the ED.  Patient with right flank pain.  CT imaging reveals evidence of right UVJ stone.  Case discussed with Dr. Arita Miss of Urology.  She agrees with plan for outpatient follow-up tomorrow.  Patient understands need for close follow-up.  Patient is comfortable at time of discharge.  Importance of close follow-up is stressed.  Strict return precautions given understood.  Of note, patient is prescribed Percocet for treatment of her renal colic at home.  E-prescribing system is down.  Paper copy of prescription given to patient.   Final Clinical Impression(s) / ED Diagnoses Final diagnoses:  Renal colic    Rx / DC Orders ED Discharge Orders         Ordered    ondansetron (ZOFRAN ODT) 4 MG disintegrating tablet  Every 8 hours PRN        10/05/20 1701    oxyCODONE-acetaminophen (PERCOCET/ROXICET) 5-325 MG tablet  Every 6 hours PRN        10/05/20 1702           Wynetta Fines, MD 10/05/20 1706

## 2020-10-05 NOTE — Discharge Instructions (Addendum)
Please return for any problem.  °

## 2020-10-05 NOTE — ED Notes (Signed)
Pt immediately ambulatory to the bathroom upon arrival Pt attempting to obtain urine specimen Will obtain V/S after

## 2020-10-05 NOTE — ED Triage Notes (Signed)
Patient arrived via [facility] from home.  C/O lower right quadrant pain that started at 9am.   Described as a constant dull pain that does not get better or worse.   Patient reports she sometimes feels dizzy when she stands up but per ems patient negative for orthostatic Bps.   C/O N/V X a few months in the morning time.    A/Ox4 Ambulatory with ems no assistance.   Hx: Kidney stones   Denies urinary symptoms, flank pain, and diarrhea.   VS:  142/86 P-64 RR-18 94% RA 97.2   Recent diagnosis of pneumonia slight wheezing on left on expiration. Patient used breathing treatment this morning.

## 2020-10-06 DIAGNOSIS — N202 Calculus of kidney with calculus of ureter: Secondary | ICD-10-CM | POA: Diagnosis not present

## 2020-10-06 DIAGNOSIS — N130 Hydronephrosis with ureteropelvic junction obstruction: Secondary | ICD-10-CM | POA: Diagnosis not present

## 2020-10-13 ENCOUNTER — Other Ambulatory Visit (HOSPITAL_COMMUNITY)
Admission: RE | Admit: 2020-10-13 | Discharge: 2020-10-13 | Disposition: A | Discharge status: Home / Self Care | Payer: Medicare Other | Source: Ambulatory Visit | Attending: Urology | Admitting: Urology

## 2020-10-13 ENCOUNTER — Other Ambulatory Visit: Payer: Self-pay | Admitting: Urology

## 2020-10-13 DIAGNOSIS — Z20822 Contact with and (suspected) exposure to covid-19: Secondary | ICD-10-CM | POA: Diagnosis not present

## 2020-10-13 DIAGNOSIS — Z01812 Encounter for preprocedural laboratory examination: Secondary | ICD-10-CM | POA: Diagnosis not present

## 2020-10-13 DIAGNOSIS — N201 Calculus of ureter: Secondary | ICD-10-CM

## 2020-10-13 LAB — SARS CORONAVIRUS 2 (TAT 6-24 HRS): SARS Coronavirus 2: NEGATIVE

## 2020-10-13 NOTE — H&P (Signed)
Yolanda Reeves is a 75 year-old female patient who was referred by Shanda Bumps Copland who is here for flank pain.  The problem is on the right side. Her pain started about approximately 09/29/2020. The pain is sharp. The pain is intermittent. The pain does radiate.   Resting< makes the pain better. Nothing causes the pain to become worse. She was treated with the following pain medication(s): Percocet.   -10 mm right UPJ stone along with several non-obstructing bilateral renal stones on CT from 10/05/20  -Stone clearly visible on scout imaging  -Hx of kidney stones--required ESWL ~10 years ago  -Percocet partially alleviates her pain  -AFVSS. Denies fever/chills  -Labs from 10/05/20 were reviewed. Serum creatinine was WNL. UA showed no signs of cystitis. WBC slightly elevated at 11,000    Technical problems with transfer of information.     On multiple medications noted No allergies Review of systems negative  Physical examination No distress  Proceed with lithotripsy  After a thorough review of the management options for the patient's condition the patient  elected to proceed with surgical therapy as noted above. We have discussed the potential benefits and risks of the procedure, side effects of the proposed treatment, the likelihood of the patient achieving the goals of the procedure, and any potential problems that might occur during the procedure or recuperation. Informed consent has been obtained.

## 2020-10-13 NOTE — H&P (Signed)
Yolanda Reeves is a 75 year-old female patient who was referred by Shanda Bumps Copland who is here for flank pain.  The problem is on the right side. Her pain started about approximately 09/29/2020. The pain is sharp. The pain is intermittent. The pain does radiate.   Resting< makes the pain better. Nothing causes the pain to become worse. She was treated with the following pain medication(s): Percocet.   -10 mm right UPJ stone along with several non-obstructing bilateral renal stones on CT from 10/05/20  -Stone clearly visible on scout imaging  -Hx of kidney stones--required ESWL ~10 years ago  -Percocet partially alleviates her pain  -AFVSS. Denies fever/chills  -Labs from 10/05/20 were reviewed. Serum creatinine was WNL. UA showed no signs of cystitis. WBC slightly elevated at 11,000     ALLERGIES: NKDA    MEDICATIONS: Albuterol Sulfate  Astelin  Lamictal  Meloxicam  Mevacor  Oxycodone-Acetaminophen  Sertraline Hcl  Tessalon  Thyroid Medication  Valium  Vitamin D2  Zofran 4 mg tablet     GU PSH: Hysterectomy     NON-GU PSH: None   GU PMH: Cervical Cancer, History      PMH Notes: Visually impaired  heart murmur   NON-GU PMH: Anxiety Arthritis Depression GERD    FAMILY HISTORY: 1 Daughter - Daughter   SOCIAL HISTORY: Marital Status: Unknown Preferred Language: English; Ethnicity: Not Hispanic Or Latino; Race: White Current Smoking Status: Patient does not smoke anymore.   Tobacco Use Assessment Completed: Used Tobacco in last 30 days? Social Drinker.  Drinks 2 caffeinated drinks per day.    REVIEW OF SYSTEMS:    GU Review Female:   Patient reports frequent urination, hard to postpone urination, get up at night to urinate, and leakage of urine. Patient denies burning /pain with urination, stream starts and stops, trouble starting your stream, have to strain to urinate, and being pregnant.  Gastrointestinal (Upper):   Patient denies nausea, vomiting, and  indigestion/ heartburn.  Gastrointestinal (Lower):   Patient denies diarrhea and constipation.  Constitutional:   Patient denies fever, night sweats, weight loss, and fatigue.  Skin:   Patient denies skin rash/ lesion and itching.  Eyes:   Patient denies blurred vision and double vision.  Ears/ Nose/ Throat:   Patient denies sinus problems and sore throat.  Hematologic/Lymphatic:   Patient denies swollen glands and easy bruising.  Cardiovascular:   Patient denies leg swelling and chest pains.  Respiratory:   Patient denies cough and shortness of breath.  Endocrine:   Patient denies excessive thirst.  Musculoskeletal:   Patient denies back pain and joint pain.  Neurological:   Patient denies headaches and dizziness.  Psychologic:   Patient denies depression and anxiety.   VITAL SIGNS:      10/06/2020 12:35 PM  Weight 222 lb / 100.7 kg  Height 63 in / 160.02 cm  BP 98/57 mmHg  Pulse 62 /min  Temperature 98.2 F / 36.7 C  BMI 39.3 kg/m   Complexity of Data:  Records Review:   Previous Patient Records  X-Ray Review: C.T. Abdomen/Pelvis: Reviewed Films. Reviewed Report. Discussed With Patient.    Notes:                     CLINICAL DATA: Acute abdominal pain. Right lower quadrant pain.   EXAM:  CT ABDOMEN AND PELVIS WITH CONTRAST   TECHNIQUE:  Multidetector CT imaging of the abdomen and pelvis was performed  using the standard protocol following bolus administration  of  intravenous contrast.   CONTRAST: OMNIPAQUE IOHEXOL 300 MG/ML SOLN   COMPARISON: 07/29/2007   FINDINGS:  Lower chest: No acute abnormality.   Hepatobiliary: No focal liver abnormality is seen. No gallstones,  gallbladder wall thickening, or biliary dilatation.   Pancreas: Unremarkable. No pancreatic ductal dilatation or  surrounding inflammatory changes.   Spleen: Normal in size without focal abnormality.   Adrenals/Urinary Tract: Normal adrenal glands. 10 mm right UPJ  calculus. Mild right  hydronephrosis. Multiple bilateral renal  calculi. Normal bladder.   Stomach/Bowel: Stomach is within normal limits. Appendix appears  normal. No evidence of bowel wall thickening, distention, or  inflammatory changes. Diverticulosis without evidence of  diverticulitis.   Vascular/Lymphatic: Normal caliber abdominal aorta with mild  atherosclerosis. No lymphadenopathy.   Reproductive: Status post hysterectomy. No adnexal masses.   Other: No abdominal wall hernia or abnormality. No abdominopelvic  ascites.   Musculoskeletal: No acute osseous abnormality. No aggressive osseous  lesion. Degenerative disease with disc height loss at L4-5 with a  broad-based disc osteophyte complex, bilateral facet arthropathy and  bilateral foraminal stenosis. Mild chronic L4 vertebral body  compression fracture.   IMPRESSION:  1. A 10 mm right UVJ calculus with mild right hydronephrosis.  2. Bilateral nonobstructing renal calculi.  3. Diverticulosis without evidence of diverticulitis.  4. Aortic Atherosclerosis (ICD10-I70.0).    Electronically Signed  By: Elige Ko  On: 10/05/2020 15:01   PROCEDURES:          Urinalysis Dipstick Dipstick Cont'd  Color: Yellow Bilirubin: Neg mg/dL  Appearance: Clear Ketones: Neg mg/dL  Specific Gravity: 2.979 Blood: Neg ery/uL  pH: 5.5 Protein: Trace mg/dL  Glucose: Neg mg/dL Urobilinogen: 0.2 mg/dL    Nitrites: Neg    Leukocyte Esterase: Neg leu/uL    ASSESSMENT:      ICD-10 Details  1 GU:   Flank Pain - R10.84 Undiagnosed New Problem  2   Renal and ureteral calculus - N20.2 Undiagnosed New Problem  3   Hydronephrosis - N13.0 Undiagnosed New Problem     PLAN:            Medications New Meds: Percocet 7.5 mg-325 mg tablet 1 tablet PO Q 4 H PRN   #12  0 Refill(s)  Tamsulosin Hcl 0.4 mg capsule 1 capsule PO Daily   #30  0 Refill(s)            Schedule Return Visit/Planned Activity: ASAP - Schedule Surgery          Document Letter(s):   Created for Patient: Clinical Summary         Notes:   The risks, benefits and alternatives of RIGHT ESWL was discussed with the patient. I described the risks which include arrhythmia, kidney contusion, kidney hemorrhage, need for transfusion, back discomfort, flank ecchymosis, flank abrasion, inability to break up stone, inability to pass stone fragments, Steinstrasse, infection associated with obstructing stones, need for different surgical procedure and possible need for repeat shockwave lithotripsy. The patient voices understanding and wishes to proceed.   We discussed criteria to return to clinic or proceed to the ER which include: Fever/chills, worsening pain, nausea/vomiting and/or persistent gross hematuria.

## 2020-10-13 NOTE — Progress Notes (Signed)
Left voice mail to stop Mobic today and to call [facility]

## 2020-10-13 NOTE — Progress Notes (Signed)
patient called back Instructions given. Cl. liquids until 0715. covid test today at 2:30 pm driver is secured arrival tme 0915. Pt is legally blind

## 2020-10-15 ENCOUNTER — Encounter (HOSPITAL_BASED_OUTPATIENT_CLINIC_OR_DEPARTMENT_OTHER): Payer: Self-pay | Admitting: Urology

## 2020-10-15 ENCOUNTER — Encounter (HOSPITAL_BASED_OUTPATIENT_CLINIC_OR_DEPARTMENT_OTHER)
Admission: RE | Disposition: A | Discharge status: Home / Self Care | Payer: Self-pay | Source: Ambulatory Visit | Attending: Urology

## 2020-10-15 ENCOUNTER — Ambulatory Visit (HOSPITAL_COMMUNITY): Payer: Medicare Other

## 2020-10-15 ENCOUNTER — Ambulatory Visit (HOSPITAL_BASED_OUTPATIENT_CLINIC_OR_DEPARTMENT_OTHER)
Admission: RE | Admit: 2020-10-15 | Discharge: 2020-10-15 | Disposition: A | Discharge status: Home / Self Care | Payer: Medicare Other | Source: Ambulatory Visit | Attending: Urology | Admitting: Urology

## 2020-10-15 ENCOUNTER — Other Ambulatory Visit: Payer: Self-pay

## 2020-10-15 DIAGNOSIS — K219 Gastro-esophageal reflux disease without esophagitis: Secondary | ICD-10-CM | POA: Diagnosis not present

## 2020-10-15 DIAGNOSIS — Z87891 Personal history of nicotine dependence: Secondary | ICD-10-CM | POA: Insufficient documentation

## 2020-10-15 DIAGNOSIS — Z01818 Encounter for other preprocedural examination: Secondary | ICD-10-CM | POA: Diagnosis not present

## 2020-10-15 DIAGNOSIS — N201 Calculus of ureter: Secondary | ICD-10-CM | POA: Diagnosis not present

## 2020-10-15 DIAGNOSIS — Z79899 Other long term (current) drug therapy: Secondary | ICD-10-CM | POA: Diagnosis not present

## 2020-10-15 DIAGNOSIS — N132 Hydronephrosis with renal and ureteral calculous obstruction: Secondary | ICD-10-CM | POA: Diagnosis not present

## 2020-10-15 DIAGNOSIS — N200 Calculus of kidney: Secondary | ICD-10-CM | POA: Diagnosis not present

## 2020-10-15 DIAGNOSIS — Z791 Long term (current) use of non-steroidal anti-inflammatories (NSAID): Secondary | ICD-10-CM | POA: Diagnosis not present

## 2020-10-15 HISTORY — PX: EXTRACORPOREAL SHOCK WAVE LITHOTRIPSY: SHX1557

## 2020-10-15 SURGERY — LITHOTRIPSY, ESWL
Anesthesia: LOCAL | Laterality: Right

## 2020-10-15 MED ORDER — CIPROFLOXACIN HCL 500 MG PO TABS
ORAL_TABLET | ORAL | Status: AC
  Filled 2020-10-15: qty 1

## 2020-10-15 MED ORDER — CIPROFLOXACIN HCL 500 MG PO TABS
500.0000 mg | ORAL_TABLET | ORAL | Status: AC
  Administered 2020-10-15: 500 mg via ORAL

## 2020-10-15 MED ORDER — SODIUM CHLORIDE 0.9 % IV SOLN: INTRAVENOUS | Status: DC

## 2020-10-15 MED ORDER — DIAZEPAM 5 MG PO TABS
10.0000 mg | ORAL_TABLET | ORAL | Status: AC
  Administered 2020-10-15: 10 mg via ORAL

## 2020-10-15 MED ORDER — DIPHENHYDRAMINE HCL 25 MG PO CAPS
ORAL_CAPSULE | ORAL | Status: AC
  Filled 2020-10-15: qty 1

## 2020-10-15 MED ORDER — DIAZEPAM 5 MG PO TABS
ORAL_TABLET | ORAL | Status: AC
  Filled 2020-10-15: qty 2

## 2020-10-15 MED ORDER — DIPHENHYDRAMINE HCL 25 MG PO CAPS
25.0000 mg | ORAL_CAPSULE | ORAL | Status: AC
  Administered 2020-10-15: 25 mg via ORAL

## 2020-10-15 NOTE — Interval H&P Note (Signed)
History and Physical Interval Note:  10/15/2020 10:00 AM  Yolanda Reeves  has presented today for surgery, with the diagnosis of RIGHT URETEROPELVIC JUNCTION STONE.  The various methods of treatment have been discussed with the patient and family. After consideration of risks, benefits and other options for treatment, the patient has consented to  Procedure(s): EXTRACORPOREAL SHOCK WAVE LITHOTRIPSY (ESWL) (Right) as a surgical intervention.  The patient's history has been reviewed, patient examined, no change in status, stable for surgery.  I have reviewed the patient's chart and labs.  Questions were answered to the patient's satisfaction.     Allicia Culley A Vern Prestia

## 2020-10-15 NOTE — Discharge Instructions (Signed)
Lithotripsy, Care After This sheet gives you information about how to care for yourself after your procedure. Your health care provider may also give you more specific instructions. If you have problems or questions, contact your health care provider. What can I expect after the procedure? After the procedure, it is common to have:  Some blood in your urine. This should only last for a few days.  Soreness in your back, sides, or upper abdomen for a few days.  Blotches or bruises on the area where the shock wave entered the skin.  Pain, discomfort, or nausea when pieces (fragments) of the kidney stone move through the tube that carries urine from the kidney to the bladder (ureter). Stone fragments may pass soon after the procedure, but they may continue to pass for up to 4-8 weeks. ? If you have severe pain or nausea, contact your health care provider. This may be caused by a large stone that was not broken up, and this may mean that you need more treatment.  Some pain or discomfort during urination.  Some pain or discomfort in the lower abdomen or (in men) at the base of the penis. Follow these instructions at home: Medicines  Take over-the-counter and prescription medicines only as told by your health care provider.  If you were prescribed an antibiotic medicine, take it as told by your health care provider. Do not stop taking the antibiotic even if you start to feel better.  Ask your health care provider if the medicine prescribed to you requires you to avoid driving or using machinery. Eating and drinking  Drink enough fluid to keep your urine pale yellow. This helps any remaining pieces of the stone to pass. It can also help prevent new stones from forming.  Eat plenty of fresh fruits and vegetables.  Follow instructions from your health care provider about eating or drinking restrictions. You may be instructed to: ? Reduce how much salt (sodium) you eat or drink. Check  ingredients and nutrition facts on packaged foods and beverages to see how much sodium they contain. ? Reduce how much meat you eat.  Eat the recommended amount of calcium for your age and gender. Ask your health care provider how much calcium you should have.      General instructions  Get plenty of rest.  Return to your normal activities as told by your health care provider. Ask your health care provider what activities are safe for you. Most people can resume normal activities 1-2 days after the procedure.  If you were given a sedative during the procedure, it can affect you for several hours. Do not drive or operate machinery until your health care provider says that it is safe.  Your health care provider may direct you to lie in a certain position (postural drainage) and tap firmly (percuss) over your kidney area to help stone fragments pass. Follow instructions as told by your health care provider.  If directed, strain all urine through the strainer that was provided by your health care provider. ? Keep all fragments for your health care provider to see. Any stones that are found may be sent to a medical lab for examination. The stone may be as small as a grain of salt.  Keep all follow-up visits as told by your health care provider. This is important. Contact a health care provider if:  You have a fever or chills.  You have nausea that is severe or does not go away.  You have  any of these urinary symptoms: ? Blood in your urine for longer than your health care provider told you to expect. ? Urine that smells bad or unusual. ? Feeling a strong urge to urinate after emptying your bladder. ? Pain or burning with urination that does not go away. ? Urinating more often than usual and this does not go away.  You have a stent and it comes out. Get help right away if:  You have severe pain in your back, sides, or upper abdomen.  You have any of these urinary symptoms: ? Severe  pain while urinating. ? More blood in your urine or having blood in your urine when you did not before. ? Passing blood clots in your urine. ? Passing only a small amount of urine or being unable to pass any urine at all.  You have severe nausea that leads to persistent vomiting.  You faint. Summary  After this procedure, it is common to have some pain, discomfort, or nausea when pieces (fragments) of the kidney stone move through the tube that carries urine from the kidney to the bladder (ureter). If this pain or nausea is severe, however, you should contact your health care provider.  Return to your normal activities as told by your health care provider. Ask your health care provider what activities are safe for you.  Drink enough fluid to keep your urine pale yellow. This helps any remaining pieces of the stone to pass, and it can help prevent new stones from forming.  If directed, strain your urine and keep all fragments for your health care provider to see. Fragments or stones may be as small as a grain of salt.  Get help right away if you have severe pain in your back, sides, or upper abdomen, or if you have severe pain while urinating. This information is not intended to replace advice given to you by your health care provider. Make sure you discuss any questions you have with your health care provider. Document Revised: 03/06/2019 Document Reviewed: 03/06/2019 Elsevier Patient Education  2021 [employer]. I have reviewed discharge instructions in detail with the patient. They will follow-up with me or their physician as scheduled. My nurse will also be calling the patients as per protocol.   I sent in more percocet from work computer

## 2020-10-15 NOTE — Interval H&P Note (Signed)
History and Physical Interval Note:  10/15/2020 10:01 AM  Yolanda Reeves  has presented today for surgery, with the diagnosis of RIGHT URETEROPELVIC JUNCTION STONE.  The various methods of treatment have been discussed with the patient and family. After consideration of risks, benefits and other options for treatment, the patient has consented to  Procedure(s): EXTRACORPOREAL SHOCK WAVE LITHOTRIPSY (ESWL) (Right) as a surgical intervention.  The patient's history has been reviewed, patient examined, no change in status, stable for surgery.  I have reviewed the patient's chart and labs.  Questions were answered to the patient's satisfaction.     Derrik Mceachern A Lovena Kluck

## 2020-10-16 ENCOUNTER — Encounter (HOSPITAL_BASED_OUTPATIENT_CLINIC_OR_DEPARTMENT_OTHER): Payer: Self-pay | Admitting: Urology

## 2020-10-29 DIAGNOSIS — N201 Calculus of ureter: Secondary | ICD-10-CM | POA: Diagnosis not present

## 2020-10-29 DIAGNOSIS — N202 Calculus of kidney with calculus of ureter: Secondary | ICD-10-CM | POA: Diagnosis not present

## 2020-11-04 ENCOUNTER — Other Ambulatory Visit: Payer: Self-pay | Admitting: Urology

## 2020-11-04 DIAGNOSIS — N201 Calculus of ureter: Secondary | ICD-10-CM

## 2020-11-04 DIAGNOSIS — N200 Calculus of kidney: Secondary | ICD-10-CM

## 2020-11-16 NOTE — Progress Notes (Signed)
Left message for patient to return call for instructions for ESWL on 11/19/2020.

## 2020-11-17 NOTE — Progress Notes (Signed)
Left another message for patient to call for ESWL instructions.

## 2020-11-17 NOTE — Progress Notes (Signed)
Talked with patient. Instructions given . Arrival time 1200 cl. Liquids  up to 1000 food up to 0800. Driver secured no recent fever, cold or cough.

## 2020-11-18 NOTE — H&P (Signed)
Office Visit Report     10/29/2020   --------------------------------------------------------------------------------   Yolanda Reeves  MRN: [id number]  DOB: 19-Feb-1946, 75 year old Female  SSN:    PRIMARY CARE:  Shanda BumpsJessica Copland  REFERRING:  Christopher A. Liliane ShiWinter, MD  PROVIDER:  Rhoderick Moodyhristopher Winter, M.D.  TREATING:  Anne FuLarry Gibson, NP  LOCATION:  [facility] [phone]     --------------------------------------------------------------------------------   CC/HPI: 10/06/2020:  -10 mm right UPJ stone along with several non-obstructing bilateral renal stones on CT from 10/05/20  -Stone clearly visible on [facility]  -Hx of kidney stones--required ESWL ~10 years ago  -Percocet partially alleviates her pain  -AFVSS. Denies fever/chills  -Labs from 10/05/20 were reviewed. Serum creatinine was WNL. UA showed no signs of cystitis. WBC slightly elevated at 11,000   10/29/2020: Patient taken for shockwave lithotripsy on 05/12. Now back today for follow-up exam with KUB. Since her procedure, the patient has had intermittent pain and discomfort with associated nausea requiring use 7.5 mg Percocet and antinausea medication to control her symptoms.   Today she brings in a large volume of stone material she has intermittently passed since the procedure. She tells me there is a significant additional about that she has passed that she did not bring in today. She continues to be intermittently symptomatic usually precipitated with nausea that is well controlled with previously prescribe Zofran followed by acute right-sided pain and discomfort requiring the use of previously prescribed pain medication. She had nausea again this morning and took Zofran which was helpful but has not had a recurrence of pain as of right now. Voiding symptoms have grossly been stable per she has had some intermittent hematuria and dysuria as well as some mild urgency this is not overly bothersome to her. She  denies interval changes in force of stream or bothersome increase in daytime frequency/urgency. She denies fevers or chills since her procedure. Despite having intermittent nausea, she has not had vomiting.     ALLERGIES: NKDA    MEDICATIONS: Percocet 7.5 mg-325 mg tablet 1-2 tablet PO Q 4 H PRN  Percocet 5 mg-325 mg tablet 1-2 tablet PO Q 8 H PRN  Tamsulosin Hcl 0.4 mg capsule 1 capsule PO Daily  Albuterol Sulfate  Astelin  Lamictal  Meloxicam  Mevacor  Oxycodone-Acetaminophen  Sertraline Hcl  Tessalon  Thyroid Medication  Valium  Vitamin D2  Zofran 4 mg tablet 1 tablet PO Q 4 H PRN  Zofran 4 mg tablet     GU PSH: ESWL, Right - 10/15/2020, Right - 10/15/2020 Hysterectomy     NON-GU PSH: No Non-GU PSH    GU PMH: Flank Pain - 10/06/2020 Hydronephrosis - 10/06/2020 Renal and ureteral calculus - 10/06/2020 Cervical Cancer, History      PMH Notes: Visually impaired  heart murmur   NON-GU PMH: Anxiety Arthritis Depression GERD    FAMILY HISTORY: 1 Daughter - Daughter   SOCIAL HISTORY: Marital Status: Unknown Preferred Language: English; Ethnicity: Not Hispanic Or Latino; Race: White Current Smoking Status: Patient does not smoke anymore.   Tobacco Use Assessment Completed: Used Tobacco in last 30 days? Social Drinker.  Drinks 2 caffeinated drinks per day.    REVIEW OF SYSTEMS:    GU Review Female:   Patient reports hard to postpone urination and burning /pain with urination. Patient denies frequent urination, get up at night to urinate, leakage of urine, stream starts and stops, trouble starting your stream, have to strain to urinate, and being pregnant.  Gastrointestinal (  Upper):   Patient reports nausea. Patient denies vomiting and indigestion/ heartburn.  Gastrointestinal (Lower):   Patient denies diarrhea and constipation.  Constitutional:   Patient denies fever, night sweats, weight loss, and fatigue.  Skin:   Patient denies skin rash/ lesion and itching.  Eyes:    Patient denies blurred vision and double vision.  Ears/ Nose/ Throat:   Patient denies sore throat and sinus problems.  Hematologic/Lymphatic:   Patient denies swollen glands and easy bruising.  Cardiovascular:   Patient denies leg swelling and chest pains.  Respiratory:   Patient denies cough and shortness of breath.  Endocrine:   Patient denies excessive thirst.  Musculoskeletal:   Patient reports back pain. Patient denies joint pain.  Neurological:   Patient denies headaches and dizziness.  Psychologic:   Patient denies depression and anxiety.   VITAL SIGNS:      10/29/2020 08:46 AM  Weight 221 lb / 100.24 kg  Height 63 in / 160.02 cm  BP 130/64 mmHg  Pulse 71 /min  Temperature 98.0 F / 36.6 C  BMI 39.1 kg/m   MULTI-SYSTEM PHYSICAL EXAMINATION:    Constitutional: Well-nourished. No physical deformities. Normally developed. Good grooming.  Neck: Neck symmetrical, not swollen. Normal tracheal position.  Respiratory: No labored breathing, no use of accessory muscles.   Cardiovascular: Normal temperature, normal extremity pulses, no swelling, no varicosities.  Skin: No paleness, no jaundice, no cyanosis. No lesion, no ulcer, no rash.  Neurologic / Psychiatric: Oriented to time, oriented to place, oriented to person. No depression, no anxiety, no agitation.  Gastrointestinal: Obese abdomen. No mass, no tenderness, no rigidity. No CVA or flank tenderness.  Musculoskeletal: Normal gait and station of head and neck.     Complexity of Data:  Source Of History:  Patient, Medical Record Summary  Records Review:   Previous Doctor Records, Previous Hospital Records, Previous Patient Records  Urine Test Review:   Urinalysis  X-Ray Review: KUB: Reviewed Films. Discussed With Patient.  C.T. Abdomen/Pelvis: Reviewed Films. Reviewed Report.     PROCEDURES:         KUB - [id number]  A single view of the abdomen is obtained. Previously identified right UPJ calculus appears to been appropriately  treated as it is no longer visible on today's exam. An approximately 7 mm right renal pelvis/UPJ calculus that was medial to the previously identified UPJ calculus still remains in same location as when her lithotripsy was performed on today's exam. A few smaller nonobstructing calculi are also visualized within the lower pole area of the right kidney. An obvious ureteral stone is not seen tracing down the anatomical expected tract of the right ureter. There is a questionable collection of opacities that could possibly represent some distal right ureteral stone fragments just distal to the right sacral wing but sensitivity is limited due to overlying bowel gas and stool pattern. Stable predominantly left-sided pelvic phlebolith are noted on today's exam. Bladder grossly appears free of obstruction. Bowel gas and stool pattern appears within normal limits.      . Patient confirmed No Neulasta OnPro Device.           Urinalysis w/Scope Dipstick Dipstick Cont'd Micro  Color: Yellow Bilirubin: Neg mg/dL WBC/hpf: NS (Not Seen)  Appearance: Clear Ketones: Neg mg/dL RBC/hpf: 0 - 2/hpf  Specific Gravity: 1.020 Blood: 1+ ery/uL Bacteria: NS (Not Seen)  pH: <=5.0 Protein: Trace mg/dL Cystals: NS (Not Seen)  Glucose: Neg mg/dL Urobilinogen: 0.2 mg/dL Casts: NS (Not Seen)  Nitrites: Neg Trichomonas: Not Present    Leukocyte Esterase: Neg leu/uL Mucous: Not Present      Epithelial Cells: 0 - 5/hpf      Yeast: NS (Not Seen)      Sperm: Not Present    Notes: MICROSCOPIC NOT CONCENTRATED    ASSESSMENT:      ICD-10 Details  1 GU:   Renal and ureteral calculus - N20.2    PLAN:           Orders Labs Urine Culture          Schedule Return Visit/Planned Activity: Other See Visit Notes - Follow up MD, Schedule Surgery          Document Letter(s):  Created for Patient: Clinical Summary         Notes:   The right UPJ calculi appears to have been appropriately treated despite the possibility of  some remaining ureteral stone fragments in the distal ureter identified distal to the right sacral wing (sensitivity limited due to overlying bowel gas and stool pattern). Based on the volume of stone material she brings in and correlated with what she endorses having still at home her strainer, I feel that the UPJ stone has been appropriately treated and passed. There remains a 73mm calculi in the right renal pelvis at our near the level of the UPJ. No obvious ureteral calculi are seen on today's study.   We discussed continued observation of the right renal pelvis stone versus going ahead and empirically treating with repeat shockwave lithotripsy especially with the previously identified UPJ calculus fragmenting well and passing.   She still remains intermittently symptomatic with nausea and pain/discomfort, fortunately well controlled with previously prescribed pain medication and antinausea medication. She is voiding at her baseline outside of some occasional urgency, dysuria and intermittent passage of hematuria. Urinalysis grossly reassuring today, a repeat urine culture will be sent. Patient would prefer to pursue shockwave lithotripsy to treat the previously mentioned stone still in the right renal pelvis/UPJ area. I will discuss this with her urologist today, if he feels this to be appropriate in I will work on getting her set up sometime in the near future for repeat shockwave lithotripsy. Obviously with worsening symptomatology, she understands to follow up here or the emergency department for additional evaluation. She understands to remain well hydrated and nourished.    * Signed by Anne Fu, NP on 10/29/20 at 11:09 AM (EDT)*       APPENDED NOTES:  I reviewed the patient's CT and KUB. To clarify on her CT scan she had a right lower pole stone and a right UPJ stone. At the time of her right shockwave 10/15/2020 the right lower pole stone had moved up into the renal pelvis behind the  existing right UPJ stone. The right UPJ stone was treated 10/15/2020 and on follow-up KUB the right lower pole stone had now further progressed to the right UPJ. This is appropriate to treat with shockwave and this is not the same stone, but the lower pole stone that migrated to the right UPJ.     * Signed by Jerilee Field, M.D. on 11/18/20 at 4:39 PM (EDT)*       The information contained in this medical record document is considered private and confidential patient information. This information can only be used for the medical diagnosis and/or medical services that are being provided by the patient's selected caregivers. This information can only be distributed outside of the patient's care if the  patient agrees and signs waivers of authorization for this information to be sent to an outside source or route.

## 2020-11-19 ENCOUNTER — Ambulatory Visit (HOSPITAL_COMMUNITY): Payer: Medicare Other

## 2020-11-19 ENCOUNTER — Encounter (HOSPITAL_BASED_OUTPATIENT_CLINIC_OR_DEPARTMENT_OTHER)
Admission: RE | Disposition: A | Discharge status: Home / Self Care | Payer: Self-pay | Source: Home / Self Care | Attending: Urology

## 2020-11-19 ENCOUNTER — Ambulatory Visit (HOSPITAL_BASED_OUTPATIENT_CLINIC_OR_DEPARTMENT_OTHER)
Admission: RE | Admit: 2020-11-19 | Discharge: 2020-11-19 | Disposition: A | Discharge status: Home / Self Care | Payer: Medicare Other | Attending: Urology | Admitting: Urology

## 2020-11-19 ENCOUNTER — Other Ambulatory Visit: Payer: Self-pay

## 2020-11-19 ENCOUNTER — Encounter (HOSPITAL_BASED_OUTPATIENT_CLINIC_OR_DEPARTMENT_OTHER): Payer: Self-pay | Admitting: Urology

## 2020-11-19 DIAGNOSIS — N200 Calculus of kidney: Secondary | ICD-10-CM | POA: Diagnosis not present

## 2020-11-19 DIAGNOSIS — N201 Calculus of ureter: Secondary | ICD-10-CM

## 2020-11-19 DIAGNOSIS — Z87442 Personal history of urinary calculi: Secondary | ICD-10-CM | POA: Diagnosis not present

## 2020-11-19 DIAGNOSIS — J45909 Unspecified asthma, uncomplicated: Secondary | ICD-10-CM | POA: Diagnosis not present

## 2020-11-19 DIAGNOSIS — N202 Calculus of kidney with calculus of ureter: Secondary | ICD-10-CM | POA: Diagnosis present

## 2020-11-19 DIAGNOSIS — M47816 Spondylosis without myelopathy or radiculopathy, lumbar region: Secondary | ICD-10-CM | POA: Diagnosis not present

## 2020-11-19 DIAGNOSIS — N132 Hydronephrosis with renal and ureteral calculous obstruction: Secondary | ICD-10-CM | POA: Diagnosis not present

## 2020-11-19 DIAGNOSIS — Z791 Long term (current) use of non-steroidal anti-inflammatories (NSAID): Secondary | ICD-10-CM | POA: Insufficient documentation

## 2020-11-19 DIAGNOSIS — Z87891 Personal history of nicotine dependence: Secondary | ICD-10-CM | POA: Diagnosis not present

## 2020-11-19 DIAGNOSIS — Z79899 Other long term (current) drug therapy: Secondary | ICD-10-CM | POA: Insufficient documentation

## 2020-11-19 DIAGNOSIS — Z01818 Encounter for other preprocedural examination: Secondary | ICD-10-CM | POA: Diagnosis not present

## 2020-11-19 HISTORY — PX: EXTRACORPOREAL SHOCK WAVE LITHOTRIPSY: SHX1557

## 2020-11-19 SURGERY — LITHOTRIPSY, ESWL
Anesthesia: LOCAL | Laterality: Right

## 2020-11-19 MED ORDER — DIPHENHYDRAMINE HCL 25 MG PO CAPS
25.0000 mg | ORAL_CAPSULE | ORAL | Status: AC
  Administered 2020-11-19: 25 mg via ORAL

## 2020-11-19 MED ORDER — DIPHENHYDRAMINE HCL 25 MG PO CAPS
ORAL_CAPSULE | ORAL | Status: AC
  Filled 2020-11-19: qty 1

## 2020-11-19 MED ORDER — CIPROFLOXACIN HCL 500 MG PO TABS
500.0000 mg | ORAL_TABLET | ORAL | Status: AC
  Administered 2020-11-19: 500 mg via ORAL

## 2020-11-19 MED ORDER — DIAZEPAM 5 MG PO TABS
10.0000 mg | ORAL_TABLET | ORAL | Status: AC
  Administered 2020-11-19: 10 mg via ORAL

## 2020-11-19 MED ORDER — SODIUM CHLORIDE 0.9 % IV SOLN: INTRAVENOUS | Status: DC

## 2020-11-19 MED ORDER — DIAZEPAM 5 MG PO TABS
ORAL_TABLET | ORAL | Status: AC
  Filled 2020-11-19: qty 2

## 2020-11-19 MED ORDER — CIPROFLOXACIN HCL 500 MG PO TABS
ORAL_TABLET | ORAL | Status: AC
  Filled 2020-11-19: qty 1

## 2020-11-19 NOTE — Interval H&P Note (Signed)
History and Physical Interval Note:  11/19/2020 2:43 PM  [name]  has presented today for surgery, with the diagnosis of RIGHT RENAL , URETEROPELVIC JUNCTION CALCULI.  The various methods of treatment have been discussed with the patient and family. After consideration of risks, benefits and other options for treatment, the patient has consented to  Procedure(s): EXTRACORPOREAL SHOCK WAVE LITHOTRIPSY (ESWL) (Right) as a surgical intervention.  The patient's history has been reviewed, patient examined, no change in status, stable for surgery.  I have reviewed the patient's chart and labs.  Questions were answered to the patient's satisfaction.  Stone has dropped and now looks like it is in the proximal ureter on KUB. She has had some right flank pain but no dyuria or gross hematuria.    Yolanda Reeves

## 2020-11-19 NOTE — Discharge Instructions (Signed)
Lithotripsy, Care After This sheet gives you information about how to care for yourself after your procedure. Your health care provider may also give you more specific instructions. If you have problems or questions, contact your health careprovider. What can I expect after the procedure? After the procedure, it is common to have: Some blood in your urine. This should only last for a few days. Soreness in your back, sides, or upper abdomen for a few days. Blotches or bruises on the area where the shock wave entered the skin. Pain, discomfort, or nausea when pieces (fragments) of the kidney stone move through the tube that carries urine from the kidney to the bladder (ureter). Stone fragments may pass soon after the procedure, but they may continue to pass for up to 4-8 weeks. If you have severe pain or nausea, contact your health care provider. This may be caused by a large stone that was not broken up, and this may mean that you need more treatment. Some pain or discomfort during urination. Some pain or discomfort in the lower abdomen or (in men) at the base of the penis. Follow these instructions at home: Medicines Take over-the-counter and prescription medicines only as told by your health care provider. If you were prescribed an antibiotic medicine, take it as told by your health care provider. Do not stop taking the antibiotic even if you start to feel better. Ask your health care provider if the medicine prescribed to you requires you to avoid driving or using machinery. Eating and drinking     Drink enough fluid to keep your urine pale yellow. This helps any remaining pieces of the stone to pass. It can also help prevent new stones from forming. Eat plenty of fresh fruits and vegetables. Follow instructions from your health care provider about eating or drinking restrictions. You may be instructed to: Reduce how much salt (sodium) you eat or drink. Check ingredients and nutrition facts  on packaged foods and beverages to see how much sodium they contain. Reduce how much meat you eat. Eat the recommended amount of calcium for your age and gender. Ask your health care provider how much calcium you should have. General instructions Get plenty of rest. Return to your normal activities as told by your health care provider. Ask your health care provider what activities are safe for you. Most people can resume normal activities 1-2 days after the procedure. If you were given a sedative during the procedure, it can affect you for several hours. Do not drive or operate machinery until your health care provider says that it is safe. Your health care provider may direct you to lie in a certain position (postural drainage) and tap firmly (percuss) over your kidney area to help stone fragments pass. Follow instructions as told by your health care provider. If directed, strain all urine through the strainer that was provided by your health care provider. Keep all fragments for your health care provider to see. Any stones that are found may be sent to a medical lab for examination. The stone may be as small as a grain of salt. Keep all follow-up visits as told by your health care provider. This is important. Contact a health care provider if: You have a fever or chills. You have nausea that is severe or does not go away. You have any of these urinary symptoms: Blood in your urine for longer than your health care provider told you to expect. Urine that smells bad or unusual. Feeling a   strong urge to urinate after emptying your bladder. Pain or burning with urination that does not go away. Urinating more often than usual and this does not go away. You have a stent and it comes out. Get help right away if: You have severe pain in your back, sides, or upper abdomen. You have any of these urinary symptoms: Severe pain while urinating. More blood in your urine or having blood in your urine when  you did not before. Passing blood clots in your urine. Passing only a small amount of urine or being unable to pass any urine at all. You have severe nausea that leads to persistent vomiting. You faint. Summary After this procedure, it is common to have some pain, discomfort, or nausea when pieces (fragments) of the kidney stone move through the tube that carries urine from the kidney to the bladder (ureter). If this pain or nausea is severe, however, you should contact your health care provider. Return to your normal activities as told by your health care provider. Ask your health care provider what activities are safe for you. Drink enough fluid to keep your urine pale yellow. This helps any remaining pieces of the stone to pass, and it can help prevent new stones from forming. If directed, strain your urine and keep all fragments for your health care provider to see. Fragments or stones may be as small as a grain of salt. Get help right away if you have severe pain in your back, sides, or upper abdomen, or if you have severe pain while urinating. This information is not intended to replace advice given to you by your health care provider. Make sure you discuss any questions you have with your healthcare provider. Document Revised: 03/05/2019 Document Reviewed: 03/06/2019 Elsevier Patient Education  2022 [employer] Inc[employer]   

## 2020-11-19 NOTE — Op Note (Signed)
Left proximal ureteral stone - 8 mm  Left ESWL  Findings: On pre-op KUB stone had moved into the right poximal ureter. Stone fragmented well and started to progress further down the right ureter. She may need a staged procedure if she fails to pass the fragments.

## 2020-11-20 ENCOUNTER — Encounter (HOSPITAL_BASED_OUTPATIENT_CLINIC_OR_DEPARTMENT_OTHER): Payer: Self-pay | Admitting: Urology

## 2020-12-10 DIAGNOSIS — N202 Calculus of kidney with calculus of ureter: Secondary | ICD-10-CM | POA: Diagnosis not present

## 2020-12-11 DIAGNOSIS — H5010 Unspecified exotropia: Secondary | ICD-10-CM | POA: Diagnosis not present

## 2020-12-11 DIAGNOSIS — H2513 Age-related nuclear cataract, bilateral: Secondary | ICD-10-CM | POA: Diagnosis not present

## 2020-12-11 DIAGNOSIS — H43391 Other vitreous opacities, right eye: Secondary | ICD-10-CM | POA: Diagnosis not present

## 2020-12-11 DIAGNOSIS — H40023 Open angle with borderline findings, high risk, bilateral: Secondary | ICD-10-CM | POA: Diagnosis not present

## 2020-12-17 ENCOUNTER — Other Ambulatory Visit: Payer: Self-pay | Admitting: Family Medicine

## 2020-12-30 DIAGNOSIS — H2513 Age-related nuclear cataract, bilateral: Secondary | ICD-10-CM | POA: Diagnosis not present

## 2020-12-30 DIAGNOSIS — H04122 Dry eye syndrome of left lacrimal gland: Secondary | ICD-10-CM | POA: Diagnosis not present

## 2020-12-30 DIAGNOSIS — H3552 Pigmentary retinal dystrophy: Secondary | ICD-10-CM | POA: Diagnosis not present

## 2020-12-30 DIAGNOSIS — H40023 Open angle with borderline findings, high risk, bilateral: Secondary | ICD-10-CM | POA: Diagnosis not present

## 2020-12-30 DIAGNOSIS — H43391 Other vitreous opacities, right eye: Secondary | ICD-10-CM | POA: Diagnosis not present

## 2021-01-10 ENCOUNTER — Other Ambulatory Visit: Payer: Self-pay | Admitting: Family Medicine

## 2021-01-11 DIAGNOSIS — H2513 Age-related nuclear cataract, bilateral: Secondary | ICD-10-CM | POA: Diagnosis not present

## 2021-01-11 DIAGNOSIS — H2511 Age-related nuclear cataract, right eye: Secondary | ICD-10-CM | POA: Diagnosis not present

## 2021-01-25 DIAGNOSIS — N201 Calculus of ureter: Secondary | ICD-10-CM | POA: Diagnosis not present

## 2021-01-26 DIAGNOSIS — H2511 Age-related nuclear cataract, right eye: Secondary | ICD-10-CM | POA: Diagnosis not present

## 2021-02-01 DIAGNOSIS — H2512 Age-related nuclear cataract, left eye: Secondary | ICD-10-CM | POA: Diagnosis not present

## 2021-02-03 DIAGNOSIS — N289 Disorder of kidney and ureter, unspecified: Secondary | ICD-10-CM | POA: Diagnosis not present

## 2021-02-03 DIAGNOSIS — Q433 Congenital malformations of intestinal fixation: Secondary | ICD-10-CM | POA: Diagnosis not present

## 2021-02-03 DIAGNOSIS — N201 Calculus of ureter: Secondary | ICD-10-CM | POA: Diagnosis not present

## 2021-02-03 DIAGNOSIS — N132 Hydronephrosis with renal and ureteral calculous obstruction: Secondary | ICD-10-CM | POA: Diagnosis not present

## 2021-02-03 DIAGNOSIS — I700 Atherosclerosis of aorta: Secondary | ICD-10-CM | POA: Diagnosis not present

## 2021-02-15 ENCOUNTER — Other Ambulatory Visit: Payer: Self-pay | Admitting: Nurse Practitioner

## 2021-02-15 DIAGNOSIS — N202 Calculus of kidney with calculus of ureter: Secondary | ICD-10-CM

## 2021-02-15 DIAGNOSIS — N281 Cyst of kidney, acquired: Secondary | ICD-10-CM

## 2021-02-16 DIAGNOSIS — H2512 Age-related nuclear cataract, left eye: Secondary | ICD-10-CM | POA: Diagnosis not present

## 2021-03-16 ENCOUNTER — Ambulatory Visit
Admission: RE | Admit: 2021-03-16 | Discharge: 2021-03-16 | Disposition: A | Discharge status: Home / Self Care | Payer: Medicare Other | Source: Ambulatory Visit | Attending: Nurse Practitioner | Admitting: Nurse Practitioner

## 2021-03-16 DIAGNOSIS — N281 Cyst of kidney, acquired: Secondary | ICD-10-CM

## 2021-03-16 DIAGNOSIS — N200 Calculus of kidney: Secondary | ICD-10-CM | POA: Diagnosis not present

## 2021-03-16 DIAGNOSIS — N202 Calculus of kidney with calculus of ureter: Secondary | ICD-10-CM

## 2021-03-19 ENCOUNTER — Other Ambulatory Visit: Payer: Medicare Other

## 2021-05-08 ENCOUNTER — Other Ambulatory Visit: Payer: Self-pay | Admitting: Family

## 2021-06-02 ENCOUNTER — Other Ambulatory Visit: Payer: Self-pay | Admitting: Family Medicine

## 2021-06-02 DIAGNOSIS — F39 Unspecified mood [affective] disorder: Secondary | ICD-10-CM

## 2021-06-02 DIAGNOSIS — E018 Other iodine-deficiency related thyroid disorders and allied conditions: Secondary | ICD-10-CM

## 2021-06-10 ENCOUNTER — Other Ambulatory Visit: Payer: Self-pay | Admitting: Family Medicine

## 2021-10-25 ENCOUNTER — Ambulatory Visit (INDEPENDENT_AMBULATORY_CARE_PROVIDER_SITE_OTHER): Payer: Medicare Other

## 2021-10-25 VITALS — Ht 61.0 in | Wt 217.0 lb

## 2021-10-25 DIAGNOSIS — Z780 Asymptomatic menopausal state: Secondary | ICD-10-CM

## 2021-10-25 DIAGNOSIS — Z0000 Encounter for general adult medical examination without abnormal findings: Secondary | ICD-10-CM

## 2021-10-25 DIAGNOSIS — Z1231 Encounter for screening mammogram for malignant neoplasm of breast: Secondary | ICD-10-CM | POA: Diagnosis not present

## 2021-10-25 NOTE — Patient Instructions (Signed)
[name] , Thank you for taking time to complete your Medicare Wellness Visit. I appreciate your ongoing commitment to your health goals. Please review the following plan we discussed and let me know if I can assist you in the future.   Screening recommendations/referrals: Colonoscopy: Due-Declined today. Please call the office to schedule if you change your mind. Mammogram: Ordered today. Someone will call you to schedule. Bone Density: Ordered today. Someone will call you to schedule. Recommended yearly ophthalmology/optometry visit for glaucoma screening and checkup Recommended yearly dental visit for hygiene and checkup  Vaccinations: Influenza vaccine: Up to date Pneumococcal vaccine: Up to date Tdap vaccine: Up to date- Shingles vaccine: Due-May obtain vaccine at your local pharmacy.  Covid-19:Up to date-Please bring dates to your ext appt  Advanced directives: Please bring a copy of Living Will and/or Healthcare Power of Attorney for your chart.   Conditions/risks identified: See problem list  Next appointment: Follow up in one year for your annual wellness visit    Preventive Care 65 Years and Older, Female Preventive care refers to lifestyle choices and visits with your health care provider that can promote health and wellness. What does preventive care include? A yearly physical exam. This is also called an annual well check. Dental exams once or twice a year. Routine eye exams. Ask your health care provider how often you should have your eyes checked. Personal lifestyle choices, including: Daily care of your teeth and gums. Regular physical activity. Eating a healthy diet. Avoiding tobacco and drug use. Limiting alcohol use. Practicing safe sex. Taking low-dose aspirin every day. Taking vitamin and mineral supplements as recommended by your health care provider. What happens during an annual well check? The services and screenings done by your health care provider  during your annual well check will depend on your age, overall health, lifestyle risk factors, and family history of disease. Counseling  Your health care provider may ask you questions about your: Alcohol use. Tobacco use. Drug use. Emotional well-being. Home and relationship well-being. Sexual activity. Eating habits. History of falls. Memory and ability to understand (cognition). Work and work Astronomer. Reproductive health. Screening  You may have the following tests or measurements: Height, weight, and BMI. Blood pressure. Lipid and cholesterol levels. These may be checked every 5 years, or more frequently if you are over 17 years old. Skin check. Lung cancer screening. You may have this screening every year starting at age 75 if you have a 30-pack-year history of smoking and currently smoke or have quit within the past 15 years. Fecal occult blood test (FOBT) of the stool. You may have this test every year starting at age 74. Flexible sigmoidoscopy or colonoscopy. You may have a sigmoidoscopy every 5 years or a colonoscopy every 10 years starting at age 109. Hepatitis C blood test. Hepatitis B blood test. Sexually transmitted disease (STD) testing. Diabetes screening. This is done by checking your blood sugar (glucose) after you have not eaten for a while (fasting). You may have this done every 1-3 years. Bone density scan. This is done to screen for osteoporosis. You may have this done starting at age 45. Mammogram. This may be done every 1-2 years. Talk to your health care provider about how often you should have regular mammograms. Talk with your health care provider about your test results, treatment options, and if necessary, the need for more tests. Vaccines  Your health care provider may recommend certain vaccines, such as: Influenza vaccine. This is recommended every year.  Tetanus, diphtheria, and acellular pertussis (Tdap, Td) vaccine. You may need a Td booster every  10 years. Zoster vaccine. You may need this after age 89. Pneumococcal 13-valent conjugate (PCV13) vaccine. One dose is recommended after age 44. Pneumococcal polysaccharide (PPSV23) vaccine. One dose is recommended after age 36. Talk to your health care provider about which screenings and vaccines you need and how often you need them. This information is not intended to replace advice given to you by your health care provider. Make sure you discuss any questions you have with your health care provider. Document Released: 06/19/2015 Document Revised: 02/10/2016 Document Reviewed: 03/24/2015 Elsevier Interactive Patient Education  2017 [place] Prevention in the Home Falls can cause injuries. They can happen to people of all ages. There are many things you can do to make your home safe and to help prevent falls. What can I do on the outside of my home? Regularly fix the edges of walkways and driveways and fix any cracks. Remove anything that might make you trip as you walk through a door, such as a raised step or threshold. Trim any bushes or trees on the path to your home. Use bright outdoor lighting. Clear any walking paths of anything that might make someone trip, such as rocks or tools. Regularly check to see if handrails are loose or broken. Make sure that both sides of any steps have handrails. Any raised decks and porches should have guardrails on the edges. Have any leaves, snow, or ice cleared regularly. Use sand or salt on walking paths during winter. Clean up any spills in your garage right away. This includes oil or grease spills. What can I do in the bathroom? Use night lights. Install grab bars by the toilet and in the tub and shower. Do not use towel bars as grab bars. Use non-skid mats or decals in the tub or shower. If you need to sit down in the shower, use a plastic, non-slip stool. Keep the floor dry. Clean up any water that spills on the floor as soon as it  happens. Remove soap buildup in the tub or shower regularly. Attach bath mats securely with double-sided non-slip rug tape. Do not have throw rugs and other things on the floor that can make you trip. What can I do in the bedroom? Use night lights. Make sure that you have a light by your bed that is easy to reach. Do not use any sheets or blankets that are too big for your bed. They should not hang down onto the floor. Have a firm chair that has side arms. You can use this for support while you get dressed. Do not have throw rugs and other things on the floor that can make you trip. What can I do in the kitchen? Clean up any spills right away. Avoid walking on wet floors. Keep items that you use a lot in easy-to-reach places. If you need to reach something above you, use a strong step stool that has a grab bar. Keep electrical cords out of the way. Do not use floor polish or wax that makes floors slippery. If you must use wax, use non-skid floor wax. Do not have throw rugs and other things on the floor that can make you trip. What can I do with my stairs? Do not leave any items on the stairs. Make sure that there are handrails on both sides of the stairs and use them. Fix handrails that are broken or loose.  Make sure that handrails are as long as the stairways. Check any carpeting to make sure that it is firmly attached to the stairs. Fix any carpet that is loose or worn. Avoid having throw rugs at the top or bottom of the stairs. If you do have throw rugs, attach them to the floor with carpet tape. Make sure that you have a light switch at the top of the stairs and the bottom of the stairs. If you do not have them, ask someone to add them for you. What else can I do to help prevent falls? Wear shoes that: Do not have high heels. Have rubber bottoms. Are comfortable and fit you well. Are closed at the toe. Do not wear sandals. If you use a stepladder: Make sure that it is fully opened.  Do not climb a closed stepladder. Make sure that both sides of the stepladder are locked into place. Ask someone to hold it for you, if possible. Clearly mark and make sure that you can see: Any grab bars or handrails. First and last steps. Where the edge of each step is. Use tools that help you move around (mobility aids) if they are needed. These include: Canes. Walkers. Scooters. Crutches. Turn on the lights when you go into a dark area. Replace any light bulbs as soon as they burn out. Set up your furniture so you have a clear path. Avoid moving your furniture around. If any of your floors are uneven, fix them. If there are any pets around you, be aware of where they are. Review your medicines with your doctor. Some medicines can make you feel dizzy. This can increase your chance of falling. Ask your doctor what other things that you can do to help prevent falls. This information is not intended to replace advice given to you by your health care provider. Make sure you discuss any questions you have with your health care provider. Document Released: 03/19/2009 Document Revised: 10/29/2015 Document Reviewed: 06/27/2014 Elsevier Interactive Patient Education  2017 [employer].

## 2021-10-25 NOTE — Progress Notes (Signed)
Subjective:   Yolanda Reeves is a 76 y.o. female who presents for Medicare Annual (Subsequent) preventive examination.  I connected with Aylina today by telephone and verified that I am speaking with the correct person using two identifiers. Location patient: home Location provider: work Persons participating in the virtual visit: patient, Marine scientist.    I discussed the limitations, risks, security and privacy concerns of performing an evaluation and management service by telephone and the availability of in person appointments. I also discussed with the patient that there may be a patient responsible charge related to this service. The patient expressed understanding and verbally consented to this telephonic visit.    Interactive audio and video telecommunications were attempted between this provider and patient, however failed, due to patient having technical difficulties OR patient did not have access to video capability.  We continued and completed visit with audio only.  Some vital signs may be absent or patient reported.   Time Spent with patient on telephone encounter: 30 minutes   Review of Systems     Cardiac Risk Factors include: advanced age (>40men, >60 women);dyslipidemia;obesity (BMI >30kg/m2);sedentary lifestyle     Objective:    Today's Vitals   10/25/21 1130  Weight: 217 lb (98.4 kg)  Height: 5\' 1"  (1.549 m)   Body mass index is 41 kg/m.     10/25/2021   11:43 AM 11/19/2020   12:34 PM 10/15/2020    9:46 AM 10/05/2020   12:24 PM 09/10/2020   12:43 PM 09/05/2019   10:49 AM  Advanced Directives  Does Patient Have a Medical Advance Directive? Yes Yes Yes No Yes Yes  Type of Paramedic of [place];Living will [place];Living will [place];Living will  Living will Living will  Does patient want to make changes to medical advance directive?  No - Patient declined    No - Patient declined  Copy of [place] in Chart? No - copy requested No - copy requested        Current Medications (verified) Outpatient Encounter Medications as of 10/25/2021  Medication Sig   albuterol (VENTOLIN HFA) 108 (90 Base) MCG/ACT inhaler INHALE 2 PUFFS INTO THE LUNGS EVERY 6 HOURS AS NEEDED FOR WHEEZING OR SHORTNESS OF BREATH   diazepam (VALIUM) 5 MG tablet TAKE 1/2 TO 1 TABLET BY  MOUTH DAILY AS NEEDED FOR  ANXIETY   lamoTRIgine (LAMICTAL) 150 MG tablet TAKE 1 TABLET BY MOUTH  DAILY   levothyroxine (SYNTHROID) 125 MCG tablet TAKE 1 TABLET BY MOUTH  DAILY BEFORE BREAKFAST   lovastatin (MEVACOR) 20 MG tablet TAKE 1 TABLET BY MOUTH AT  BEDTIME   Omega-3 Fatty Acids (FISH OIL) 500 MG CAPS Take by mouth.   ondansetron (ZOFRAN ODT) 4 MG disintegrating tablet Take 1 tablet (4 mg total) by mouth every 8 (eight) hours as needed for nausea or vomiting.   sertraline (ZOLOFT) 100 MG tablet TAKE 1 AND 1/2 TABLETS BY  MOUTH DAILY   ergocalciferol (VITAMIN D2) 50000 units capsule Take 1 capsule (50,000 Units total) by mouth once a week. Reported on 08/05/2015 (Patient not taking: Reported on 10/25/2021)   meloxicam (MOBIC) 7.5 MG tablet TK 1 T PO QD FOR JOINT PAIN (Patient not taking: Reported on 10/25/2021)   oxyCODONE-acetaminophen (PERCOCET/ROXICET) 5-325 MG tablet Take 1 tablet by mouth every 6 (six) hours as needed for severe pain. (Patient not taking: Reported on 10/25/2021)   oxyCODONE-acetaminophen (PERCOCET/ROXICET) 5-325 MG tablet Take 1  tablet by mouth every 6 (six) hours as needed for severe pain. (Patient not taking: Reported on 10/25/2021)   No facility-administered encounter medications on file as of 10/25/2021.    Allergies (verified) Patient has no known allergies.   History: Past Medical History:  Diagnosis Date   Best dystrophy 2012   Chest pain, atypical    Heart murmur    HYPERLIPIDEMIA    HYPOTHYROIDISM, POST-RADIOACTIVE IODINE    OSTEOPENIA    Sleep apnea    TOBACCO USE    UNSPECIFIED  SUBJECTIVE VISUAL DISTURBANCE    VITAMIN D DEFICIENCY    WART, RIGHT HAND    Past Surgical History:  Procedure Laterality Date   ABDOMINAL HYSTERECTOMY     CARPAL TUNNEL RELEASE     EXTRACORPOREAL SHOCK WAVE LITHOTRIPSY Right 10/15/2020   Procedure: EXTRACORPOREAL SHOCK WAVE LITHOTRIPSY (ESWL);  Surgeon: Bjorn Loser, MD;  Location[facility];  Service: Urology;  Laterality: Right;   EXTRACORPOREAL SHOCK WAVE LITHOTRIPSY Right 11/19/2020   Procedure: EXTRACORPOREAL SHOCK WAVE LITHOTRIPSY (ESWL);  Surgeon: Festus Aloe, MD;  Location[facility];  Service: Urology;  Laterality: Right;   EYE SURGERY     LAPAROSCOPIC OOPHERECTOMY     LITHOTRIPSY     TONSILLECTOMY     Family History  Problem Relation Age of Onset   Cancer Brother        thyroid   Prostate cancer Brother    Skin cancer Sister    Colon cancer Paternal Grandmother    Thyroid cancer Brother    Social History   Socioeconomic History   Marital status: Divorced    Spouse name: Not on file   Number of children: Not on file   Years of education: 14   Highest education level: Not on file  Occupational History   Not on file  Tobacco Use   Smoking status: Former    Packs/day: 1.00    Years: 50.00    Pack years: 50.00    Types: Cigarettes    Quit date: 02/05/2011    Years since quitting: 10.7   Smokeless tobacco: Never  Substance and Sexual Activity   Alcohol use: Yes    Alcohol/week: 0.0 standard drinks    Comment: rarely   Drug use: No   Sexual activity: Not on file  Other Topics Concern   Not on file  Social History Narrative   Drinks 3-5 cups of caffeine daily.   Social Determinants of Health   Financial Resource Strain: Low Risk    Difficulty of Paying Living Expenses: Not very hard  Food Insecurity: No Food Insecurity   Worried About Charity fundraiser in the Last Year: Never true   Ran Out of Food in the Last Year: Never true  Transportation Needs: Unmet  Transportation Needs   Lack of Transportation (Medical): Yes   Lack of Transportation (Non-Medical): No  Physical Activity: Insufficiently Active   Days of Exercise per Week: 4 days   Minutes of Exercise per Session: 30 min  Stress: Stress Concern Present   Feeling of Stress : To some extent  Social Connections: Not on file    Tobacco Counseling Counseling given: Not Answered   Clinical Intake:  Pre-visit preparation completed: Yes  Pain : 0-10 Pain Type: Chronic pain Pain Location: Hand (cramps in hands feet) Pain Descriptors / Indicators: Cramping Pain Onset: More than a month ago Pain Frequency: Intermittent     BMI - recorded: 41 Nutritional Status: BMI > 30  Obese Nutritional Risks: None Diabetes: No  How often do you need to have someone help you when you read instructions, pamphlets, or other written materials from your doctor or pharmacy?: 1 - Never  Diabetic?No  Interpreter Needed?: No  Information entered by :: Caroleen Hamman LPN   Activities of Daily Living    10/25/2021   11:51 AM 11/19/2020   12:41 PM  In your present state of health, do you have any difficulty performing the following activities:  Hearing? 1 1  Comment hearing aids   Vision? 1 1  Difficulty concentrating or making decisions? 1 0  Comment occasionally   Walking or climbing stairs? 0 0  Dressing or bathing? 0 0  Doing errands, shopping? 1   Comment due to vision impairment   Preparing Food and eating ? N   Using the Toilet? N   In the past six months, have you accidently leaked urine? Y   Do you have problems with loss of bowel control? N   Managing your Medications? N   Managing your Finances? N   Housekeeping or managing your Housekeeping? N     Patient Care Team: Copland, Gay Filler, MD as PCP - General (Family Medicine) Dohmeier, Asencion Partridge, MD as Consulting Physician (Neurology) Collene Gobble, MD as Consulting Physician (Pulmonary Disease)  Indicate any recent Medical  Services you may have received from other than [facility] providers in the past year (date may be approximate).     Assessment:   This is a routine wellness examination for [place].  Hearing/Vision screen Hearing Screening - Comments:: Bilateral hearing aids Vision Screening - Comments:: Last eye exam-3-4 months ago-[facility]  Dietary issues and exercise activities discussed: Current Exercise Habits: Home exercise routine, Type of exercise: walking, Time (Minutes): 30, Frequency (Times/Week): 4, Weekly Exercise (Minutes/Week): 120, Intensity: Mild   Goals Addressed             This Visit's Progress    Patient Stated   On track    Eat healthier meals       Depression Screen    10/25/2021   11:51 AM 09/10/2020   12:50 PM 09/05/2019   10:58 AM 09/19/2016    2:59 PM 08/06/2015    1:16 PM 05/11/2015    9:52 AM 12/04/2014   10:12 AM  PHQ 2/9 Scores  PHQ - 2 Score 1 1 2  0 0 2 0  PHQ- 9 Score   8   13   Exception Documentation     Other- indicate reason in comment box      Fall Risk    10/25/2021   11:46 AM 09/10/2020   12:48 PM 09/05/2019   10:58 AM 09/19/2016    2:59 PM 08/06/2015    1:16 PM  Fall Risk   Falls in the past year? 1 1 0 No Yes  Number falls in past yr: 1 1 0  2 or more  Injury with Fall? 0 0 0  No  Risk Factor Category      High Fall Risk  Risk for fall due to : History of fall(s) History of fall(s);Impaired vision   Impaired vision  Follow up Falls prevention discussed Falls prevention discussed Education provided;Falls prevention discussed  Falls prevention discussed    FALL RISK PREVENTION PERTAINING TO THE HOME:  Any stairs in or around the home? No  Home free of loose throw rugs in walkways, pet beds, electrical cords, etc? Yes  Adequate lighting in your home to reduce  risk of falls? Yes   ASSISTIVE DEVICES UTILIZED TO PREVENT FALLS:  Life alert? No  Use of a cane, walker or w/c? Yes cane  Grab bars in the bathroom? No  Shower chair or bench in shower?  No  Elevated toilet seat or a handicapped toilet? No   TIMED UP AND GO:  Was the test performed? No . Phone visit   Cognitive Function:Normal cognitive status assessed by direct observation by this Nurse Health Advisor. No abnormalities found.          09/10/2020   12:59 PM  6CIT Screen  What Year? 0 points  What month? 0 points  What time? 0 points  Count back from 20 0 points  Months in reverse 0 points  Repeat phrase 0 points  Total Score 0 points    Immunizations Immunization History  Administered Date(s) Administered   Influenza, High Dose Seasonal PF 03/09/2018   Influenza,inj,Quad PF,6+ Mos 05/16/2013   Influenza-Unspecified 04/07/2015, 03/09/2018   PFIZER(Purple Top)SARS-COV-2 Vaccination 08/01/2019, 08/27/2019   Pneumococcal Conjugate-13 08/06/2015   Pneumococcal Polysaccharide-23 06/11/2008, 08/30/2012   Td 06/06/2006   Tdap 02/07/2012    TDAP status: Up to date  Flu vaccine status: Due 02/2022  Pneumococcal vaccine status: Up to date  Covid-19 vaccine status: Completed vaccines per patient  Qualifies for Shingles Vaccine? Yes   Zostavax completed No   Shingrix Completed?: No.    Education has been provided regarding the importance of this vaccine. Patient has been advised to call insurance company to determine out of pocket expense if they have not yet received this vaccine. Advised may also receive vaccine at local pharmacy or Health Dept. Verbalized acceptance and understanding.  Screening Tests Health Maintenance  Topic Date Due   Zoster Vaccines- Shingrix (1 of 2) Never done   COLONOSCOPY (Pts 45-46yrs Insurance coverage will need to be confirmed)  05/17/2015   MAMMOGRAM  10/05/2017   COVID-19 Vaccine (3 - Booster for Pfizer series) 10/22/2019   INFLUENZA VACCINE  01/04/2022   TETANUS/TDAP  02/06/2022   Pneumonia Vaccine 79+ Years old  Completed   DEXA SCAN  Completed   Hepatitis C Screening  Completed   HPV VACCINES  Aged Out    Health  Maintenance  Health Maintenance Due  Topic Date Due   Zoster Vaccines- Shingrix (1 of 2) Never done   COLONOSCOPY (Pts 45-27yrs Insurance coverage will need to be confirmed)  05/17/2015   MAMMOGRAM  10/05/2017   COVID-19 Vaccine (3 - Booster for [place] series) 10/22/2019    Colorectal cancer screening: Declined  Mammogram status: Ordered today. Pt provided with contact info and advised to call to schedule appt.   Bone Density status: Ordered today. Pt provided with contact info and advised to call to schedule appt.  Lung Cancer Screening: (Low Dose CT Chest recommended if Age 62-80 years, 30 pack-year currently smoking OR have quit w/in 15years.) does qualify.   Lung Cancer Screening Referral: Discuss with PCP  Additional Screening:  Hepatitis C Screening: Completed 08/06/2015  Vision Screening: Recommended annual ophthalmology exams for early detection of glaucoma and other disorders of the eye. Is the patient up to date with their annual eye exam?  Yes  Who is the provider or what is the name of the office in which the patient attends annual eye exams? [facility]   Dental Screening: Recommended annual dental exams for proper oral hygiene  Community Resource Referral / Chronic Care Management: CRR required this visit?  Yes For transportation  assistance  CCM required this visit?  No      Plan:     I have personally reviewed and noted the following in the patient's chart:   Medical and social history Use of alcohol, tobacco or illicit drugs  Current medications and supplements including opioid prescriptions.  Functional ability and status Nutritional status Physical activity Advanced directives List of other physicians Hospitalizations, surgeries, and ER visits in previous 12 months Vitals Screenings to include cognitive, depression, and falls Referrals and appointments  In addition, I have reviewed and discussed with patient certain preventive protocols,  quality metrics, and best practice recommendations. A written personalized care plan for preventive services as well as general preventive health recommendations were provided to patient.   Due to this being a telephonic visit, the after visit summary with patients personalized plan was offered to patient via mail or my-chart. Patient would like to access on my-chart.  Marta Antu, LPN   [id number]  Nurse Health Advisor  Nurse Notes: None

## 2021-10-26 ENCOUNTER — Telehealth: Payer: Self-pay

## 2021-10-26 NOTE — Telephone Encounter (Signed)
   Telephone encounter was:  Successful.  10/26/2021 Name: Yolanda Reeves MRN: [id number] DOB: Jan 23, 1946  Yolanda Reeves is a 76 y.o. year old female who is a primary care patient of Copland, Gay Filler, MD . The community resource team was consulted for assistance with Transportation Needs   Care guide performed the following interventions: Patient provided with information about care guide support team and interviewed to confirm resource needs.Patient is blind needing transportation because [place] dmv took her licence. Patient does have transpotation with her insurance. She has all contact infomation needed.  Follow Up Plan:  No further follow up planned at this time. The patient has been provided with needed resources.    [facility], Care Management  [phone] [address], [place], [place] [address] Phone: [phone] Email: [email or web address]

## 2021-10-30 LAB — HM HEPATITIS C SCREENING LAB: HM Hepatitis Screen: NEGATIVE

## 2022-03-30 DIAGNOSIS — H40023 Open angle with borderline findings, high risk, bilateral: Secondary | ICD-10-CM | POA: Diagnosis not present

## 2022-03-30 DIAGNOSIS — Z961 Presence of intraocular lens: Secondary | ICD-10-CM | POA: Diagnosis not present

## 2022-03-30 DIAGNOSIS — H04123 Dry eye syndrome of bilateral lacrimal glands: Secondary | ICD-10-CM | POA: Diagnosis not present

## 2022-04-19 ENCOUNTER — Other Ambulatory Visit: Payer: Self-pay | Admitting: Family Medicine

## 2022-04-19 DIAGNOSIS — E018 Other iodine-deficiency related thyroid disorders and allied conditions: Secondary | ICD-10-CM

## 2022-04-19 DIAGNOSIS — E785 Hyperlipidemia, unspecified: Secondary | ICD-10-CM

## 2022-04-22 ENCOUNTER — Other Ambulatory Visit: Payer: Medicare Other

## 2022-04-22 ENCOUNTER — Ambulatory Visit: Payer: Medicare Other

## 2022-05-04 ENCOUNTER — Other Ambulatory Visit: Payer: Self-pay | Admitting: Family Medicine

## 2022-05-04 DIAGNOSIS — E785 Hyperlipidemia, unspecified: Secondary | ICD-10-CM

## 2022-08-25 IMAGING — CT CT ABD-PELV W/ CM
2 of 5 series · 16 of 46 positions shown, 18 images · IV contrast (omnipaque)
Comparison: 07/29/2007

CLINICAL DATA: Acute abdominal pain.  Right lower quadrant pain.

EXAM:
CT ABDOMEN AND PELVIS WITH CONTRAST
TECHNIQUE: Multidetector CT imaging of the abdomen and pelvis was performed
using the standard protocol following bolus administration of
intravenous contrast.
CONTRAST:  100mL OMNIPAQUE IOHEXOL 300 MG/ML  SOLN

[Series 2: axial st · axial · 0.97mm/px · z∈[-691,-301]mm · 13 of 90 slices shown, 15 images]
[im 6/90  soft-tissue]
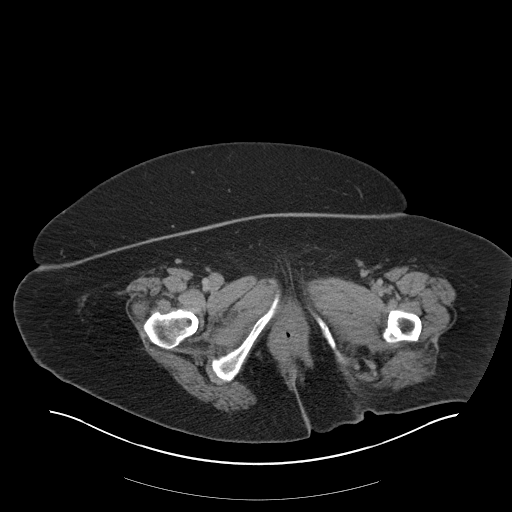
[im 6/90  bone]
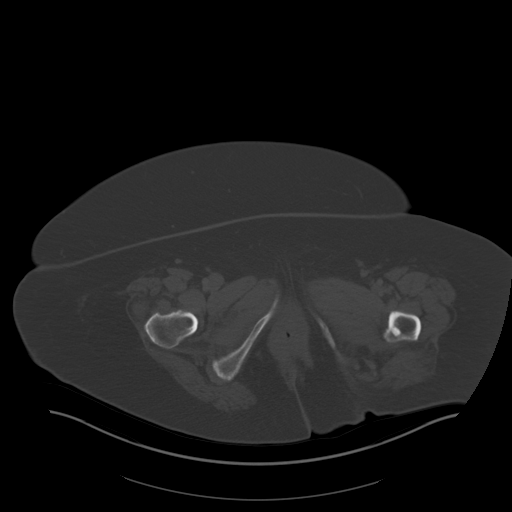
[im 12/90  soft-tissue]
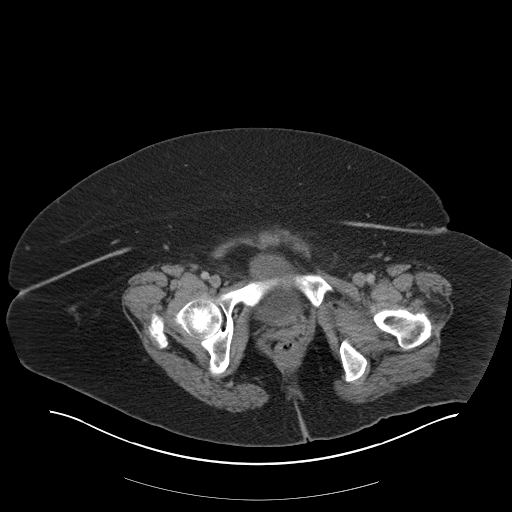
[im 18/90  soft-tissue]
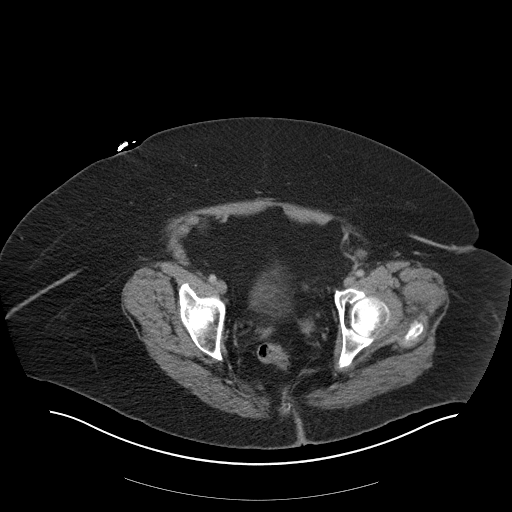
[im 24/90  soft-tissue]
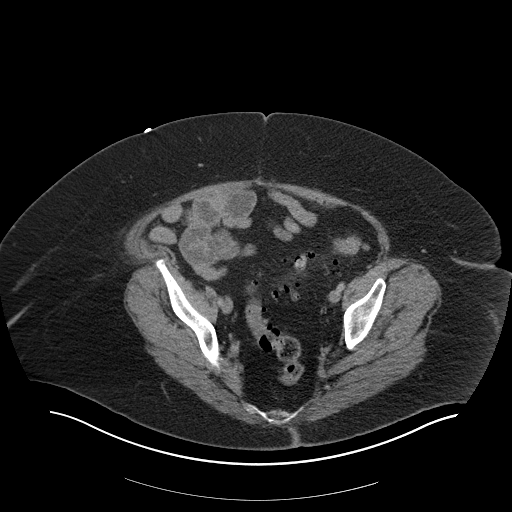
[im 30/90  soft-tissue]
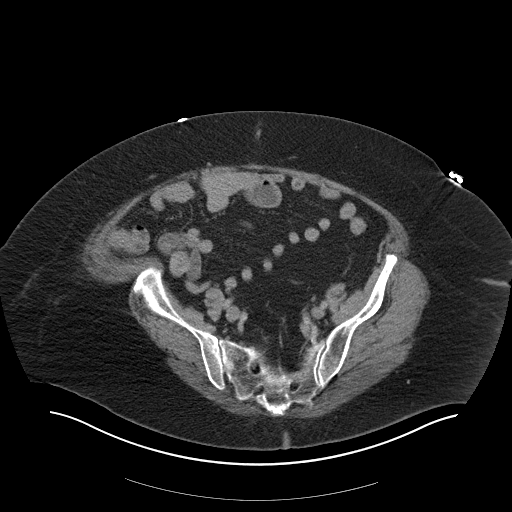
[im 36/90  soft-tissue]
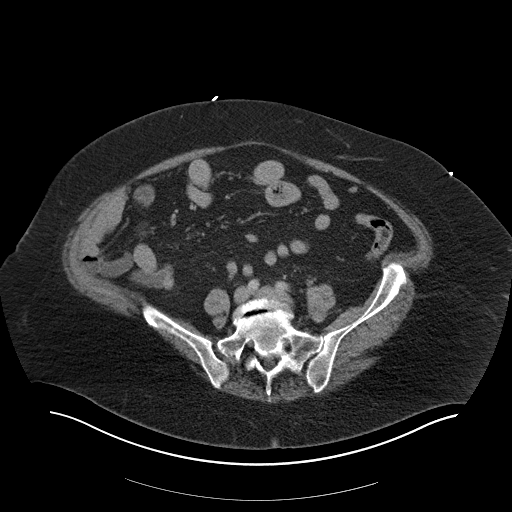
[im 48/90  soft-tissue]
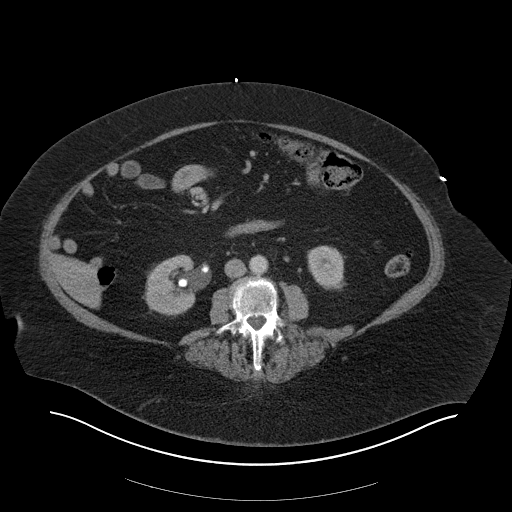
[im 54/90  soft-tissue]
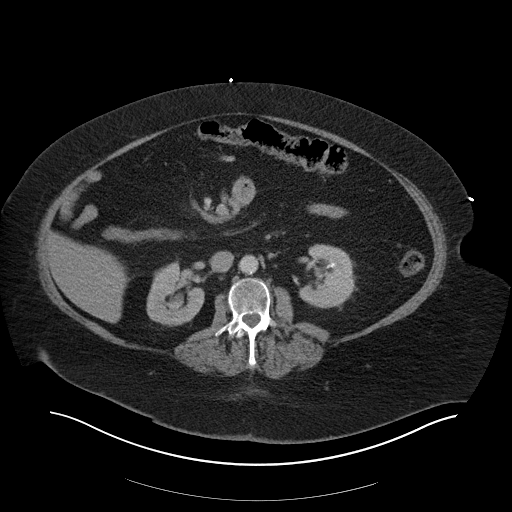
[im 60/90  soft-tissue]
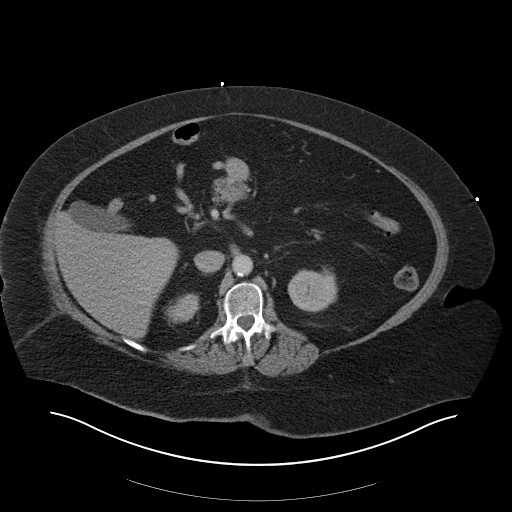
[im 60/90  bone]
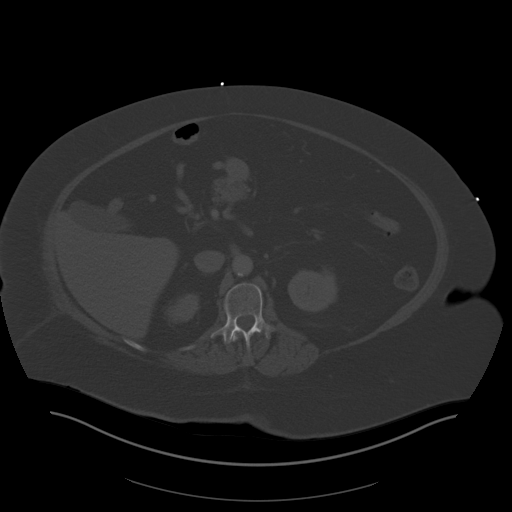
[im 66/90  soft-tissue]
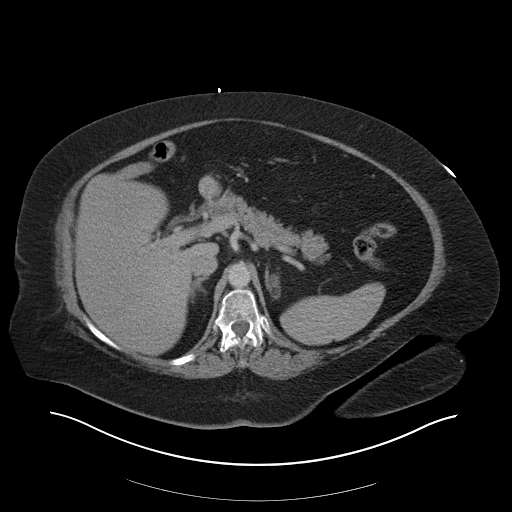
[im 72/90  soft-tissue]
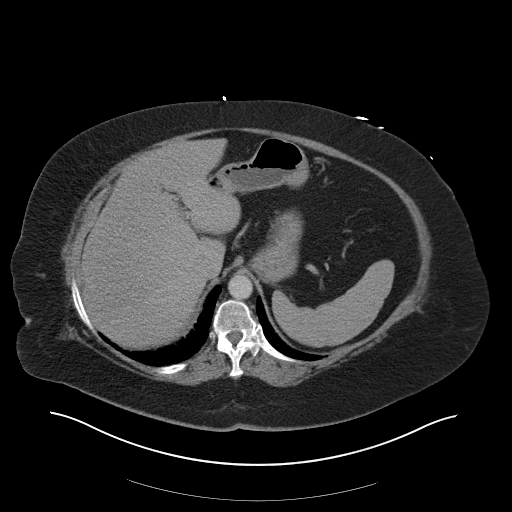
[im 78/90  soft-tissue]
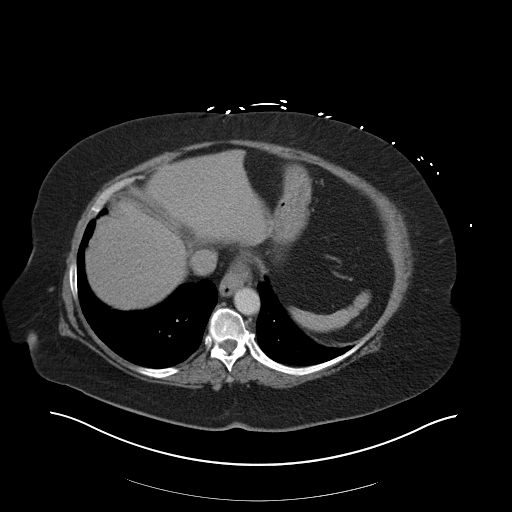
[im 84/90  soft-tissue]
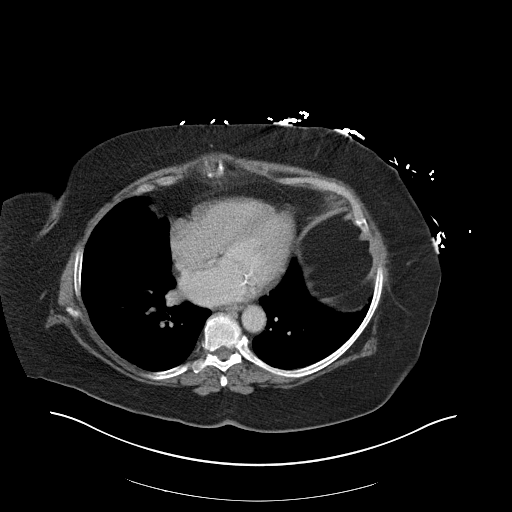

[Series 4: coronal st · coronal · 0.96mm/px · 3 of 157 slices shown]
[im 53/157  soft-tissue]
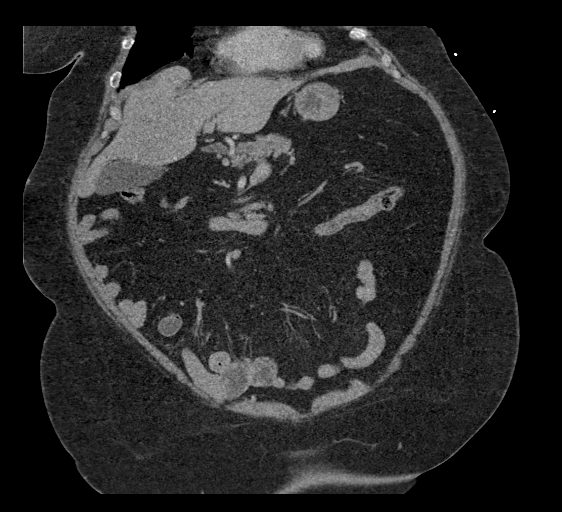
[im 70/157  soft-tissue]
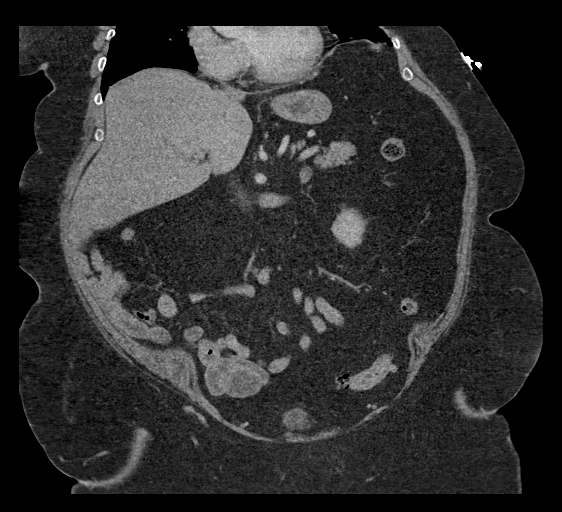
[im 87/157  soft-tissue]
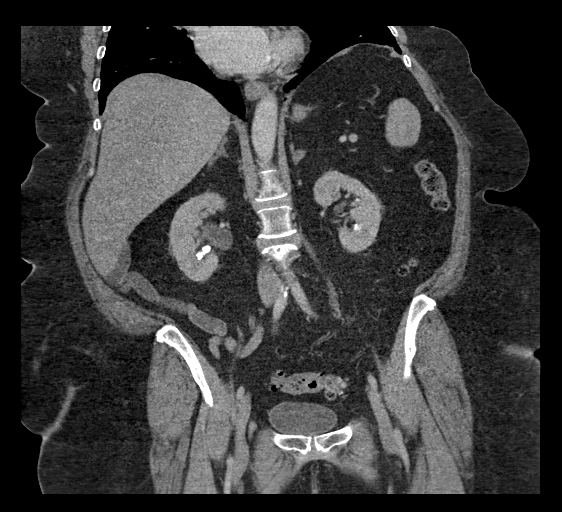

[16 of 46 positions shown; findings below may reference images not displayed]

FINDINGS: Lower chest: No acute abnormality.

Hepatobiliary: No focal liver abnormality is seen. No gallstones,
gallbladder wall thickening, or biliary dilatation.

Pancreas: Unremarkable. No pancreatic ductal dilatation or
surrounding inflammatory changes.

Spleen: Normal in size without focal abnormality.

Adrenals/Urinary Tract: Normal adrenal glands. 10 mm right UPJ
calculus. Mild right hydronephrosis. Multiple bilateral renal
calculi. Normal bladder.

Stomach/Bowel: Stomach is within normal limits. Appendix appears
normal. No evidence of bowel wall thickening, distention, or
inflammatory changes. Diverticulosis without evidence of
diverticulitis.

Vascular/Lymphatic: Normal caliber abdominal aorta with mild
atherosclerosis. No lymphadenopathy.

Reproductive: Status post hysterectomy. No adnexal masses.

Other: No abdominal wall hernia or abnormality. No abdominopelvic
ascites.

Musculoskeletal: No acute osseous abnormality. No aggressive osseous
lesion. Degenerative disease with disc height loss at L4-5 with a
broad-based disc osteophyte complex, bilateral facet arthropathy and
bilateral foraminal stenosis. Mild chronic L4 vertebral body
compression fracture.
IMPRESSION: 1. A 10 mm right UVJ calculus with mild right hydronephrosis.
2. Bilateral nonobstructing renal calculi.
3. Diverticulosis without evidence of diverticulitis.
4.  Aortic Atherosclerosis ([id number]).

## 2022-12-15 ENCOUNTER — Other Ambulatory Visit: Payer: Self-pay | Admitting: Family Medicine

## 2022-12-15 DIAGNOSIS — E018 Other iodine-deficiency related thyroid disorders and allied conditions: Secondary | ICD-10-CM

## 2023-02-23 ENCOUNTER — Other Ambulatory Visit: Payer: Self-pay | Admitting: Family Medicine

## 2023-02-23 DIAGNOSIS — E018 Other iodine-deficiency related thyroid disorders and allied conditions: Secondary | ICD-10-CM

## 2023-06-09 DIAGNOSIS — H04123 Dry eye syndrome of bilateral lacrimal glands: Secondary | ICD-10-CM | POA: Diagnosis not present

## 2023-06-09 DIAGNOSIS — H353134 Nonexudative age-related macular degeneration, bilateral, advanced atrophic with subfoveal involvement: Secondary | ICD-10-CM | POA: Diagnosis not present

## 2023-06-09 DIAGNOSIS — H40023 Open angle with borderline findings, high risk, bilateral: Secondary | ICD-10-CM | POA: Diagnosis not present

## 2023-06-22 ENCOUNTER — Emergency Department (HOSPITAL_COMMUNITY): Payer: Medicare Other

## 2023-06-22 ENCOUNTER — Observation Stay (HOSPITAL_COMMUNITY): Payer: Medicare Other

## 2023-06-22 ENCOUNTER — Inpatient Hospital Stay (HOSPITAL_COMMUNITY)
Admission: EM | Admit: 2023-06-22 | Discharge: 2023-07-10 | DRG: 917 | Disposition: A | Discharge status: Hospice | Payer: Medicare Other | Attending: Internal Medicine | Admitting: Internal Medicine

## 2023-06-22 ENCOUNTER — Other Ambulatory Visit: Payer: Self-pay

## 2023-06-22 DIAGNOSIS — Z87442 Personal history of urinary calculi: Secondary | ICD-10-CM

## 2023-06-22 DIAGNOSIS — E66812 Obesity, class 2: Secondary | ICD-10-CM | POA: Insufficient documentation

## 2023-06-22 DIAGNOSIS — F411 Generalized anxiety disorder: Secondary | ICD-10-CM | POA: Diagnosis present

## 2023-06-22 DIAGNOSIS — Z7189 Other specified counseling: Secondary | ICD-10-CM | POA: Diagnosis not present

## 2023-06-22 DIAGNOSIS — Z800 Family history of malignant neoplasm of digestive organs: Secondary | ICD-10-CM

## 2023-06-22 DIAGNOSIS — R441 Visual hallucinations: Secondary | ICD-10-CM | POA: Diagnosis present

## 2023-06-22 DIAGNOSIS — R4020 Unspecified coma: Secondary | ICD-10-CM | POA: Diagnosis not present

## 2023-06-22 DIAGNOSIS — Z515 Encounter for palliative care: Secondary | ICD-10-CM

## 2023-06-22 DIAGNOSIS — M8580 Other specified disorders of bone density and structure, unspecified site: Secondary | ICD-10-CM | POA: Diagnosis present

## 2023-06-22 DIAGNOSIS — T17908A Unspecified foreign body in respiratory tract, part unspecified causing other injury, initial encounter: Principal | ICD-10-CM

## 2023-06-22 DIAGNOSIS — R946 Abnormal results of thyroid function studies: Secondary | ICD-10-CM | POA: Diagnosis present

## 2023-06-22 DIAGNOSIS — T50904A Poisoning by unspecified drugs, medicaments and biological substances, undetermined, initial encounter: Secondary | ICD-10-CM | POA: Diagnosis not present

## 2023-06-22 DIAGNOSIS — R531 Weakness: Secondary | ICD-10-CM | POA: Diagnosis not present

## 2023-06-22 DIAGNOSIS — E785 Hyperlipidemia, unspecified: Secondary | ICD-10-CM | POA: Diagnosis present

## 2023-06-22 DIAGNOSIS — J441 Chronic obstructive pulmonary disease with (acute) exacerbation: Secondary | ICD-10-CM | POA: Diagnosis not present

## 2023-06-22 DIAGNOSIS — Z801 Family history of malignant neoplasm of trachea, bronchus and lung: Secondary | ICD-10-CM

## 2023-06-22 DIAGNOSIS — E890 Postprocedural hypothyroidism: Secondary | ICD-10-CM | POA: Diagnosis present

## 2023-06-22 DIAGNOSIS — D3502 Benign neoplasm of left adrenal gland: Secondary | ICD-10-CM | POA: Diagnosis present

## 2023-06-22 DIAGNOSIS — C3412 Malignant neoplasm of upper lobe, left bronchus or lung: Secondary | ICD-10-CM | POA: Diagnosis present

## 2023-06-22 DIAGNOSIS — I3139 Other pericardial effusion (noninflammatory): Secondary | ICD-10-CM | POA: Diagnosis not present

## 2023-06-22 DIAGNOSIS — T402X5A Adverse effect of other opioids, initial encounter: Secondary | ICD-10-CM | POA: Diagnosis not present

## 2023-06-22 DIAGNOSIS — R918 Other nonspecific abnormal finding of lung field: Secondary | ICD-10-CM | POA: Diagnosis present

## 2023-06-22 DIAGNOSIS — W19XXXA Unspecified fall, initial encounter: Secondary | ICD-10-CM | POA: Diagnosis not present

## 2023-06-22 DIAGNOSIS — H9190 Unspecified hearing loss, unspecified ear: Secondary | ICD-10-CM | POA: Diagnosis present

## 2023-06-22 DIAGNOSIS — F17290 Nicotine dependence, other tobacco product, uncomplicated: Secondary | ICD-10-CM | POA: Diagnosis present

## 2023-06-22 DIAGNOSIS — C787 Secondary malignant neoplasm of liver and intrahepatic bile duct: Secondary | ICD-10-CM | POA: Diagnosis not present

## 2023-06-22 DIAGNOSIS — G893 Neoplasm related pain (acute) (chronic): Secondary | ICD-10-CM | POA: Diagnosis present

## 2023-06-22 DIAGNOSIS — I252 Old myocardial infarction: Secondary | ICD-10-CM

## 2023-06-22 DIAGNOSIS — K8020 Calculus of gallbladder without cholecystitis without obstruction: Secondary | ICD-10-CM | POA: Diagnosis present

## 2023-06-22 DIAGNOSIS — G894 Chronic pain syndrome: Secondary | ICD-10-CM | POA: Diagnosis present

## 2023-06-22 DIAGNOSIS — N179 Acute kidney failure, unspecified: Secondary | ICD-10-CM | POA: Diagnosis not present

## 2023-06-22 DIAGNOSIS — I499 Cardiac arrhythmia, unspecified: Secondary | ICD-10-CM | POA: Diagnosis not present

## 2023-06-22 DIAGNOSIS — J9811 Atelectasis: Secondary | ICD-10-CM | POA: Diagnosis not present

## 2023-06-22 DIAGNOSIS — R3915 Urgency of urination: Secondary | ICD-10-CM | POA: Diagnosis not present

## 2023-06-22 DIAGNOSIS — R9431 Abnormal electrocardiogram [ECG] [EKG]: Secondary | ICD-10-CM | POA: Diagnosis not present

## 2023-06-22 DIAGNOSIS — R296 Repeated falls: Secondary | ICD-10-CM | POA: Diagnosis present

## 2023-06-22 DIAGNOSIS — I6782 Cerebral ischemia: Secondary | ICD-10-CM | POA: Diagnosis not present

## 2023-06-22 DIAGNOSIS — Z6841 Body Mass Index (BMI) 40.0 and over, adult: Secondary | ICD-10-CM

## 2023-06-22 DIAGNOSIS — E018 Other iodine-deficiency related thyroid disorders and allied conditions: Secondary | ICD-10-CM

## 2023-06-22 DIAGNOSIS — Z5982 Transportation insecurity: Secondary | ICD-10-CM

## 2023-06-22 DIAGNOSIS — R911 Solitary pulmonary nodule: Secondary | ICD-10-CM | POA: Diagnosis not present

## 2023-06-22 DIAGNOSIS — E66813 Obesity, class 3: Secondary | ICD-10-CM | POA: Diagnosis present

## 2023-06-22 DIAGNOSIS — H547 Unspecified visual loss: Secondary | ICD-10-CM | POA: Diagnosis present

## 2023-06-22 DIAGNOSIS — Z808 Family history of malignant neoplasm of other organs or systems: Secondary | ICD-10-CM

## 2023-06-22 DIAGNOSIS — C3490 Malignant neoplasm of unspecified part of unspecified bronchus or lung: Secondary | ICD-10-CM | POA: Diagnosis not present

## 2023-06-22 DIAGNOSIS — R160 Hepatomegaly, not elsewhere classified: Secondary | ICD-10-CM | POA: Diagnosis not present

## 2023-06-22 DIAGNOSIS — M25512 Pain in left shoulder: Secondary | ICD-10-CM | POA: Diagnosis not present

## 2023-06-22 DIAGNOSIS — R6889 Other general symptoms and signs: Secondary | ICD-10-CM | POA: Diagnosis not present

## 2023-06-22 DIAGNOSIS — Z8543 Personal history of malignant neoplasm of ovary: Secondary | ICD-10-CM

## 2023-06-22 DIAGNOSIS — D649 Anemia, unspecified: Secondary | ICD-10-CM | POA: Insufficient documentation

## 2023-06-22 DIAGNOSIS — Z79899 Other long term (current) drug therapy: Secondary | ICD-10-CM

## 2023-06-22 DIAGNOSIS — T424X1A Poisoning by benzodiazepines, accidental (unintentional), initial encounter: Secondary | ICD-10-CM | POA: Diagnosis not present

## 2023-06-22 DIAGNOSIS — R0789 Other chest pain: Secondary | ICD-10-CM

## 2023-06-22 DIAGNOSIS — G929 Unspecified toxic encephalopathy: Secondary | ICD-10-CM | POA: Diagnosis not present

## 2023-06-22 DIAGNOSIS — Z8042 Family history of malignant neoplasm of prostate: Secondary | ICD-10-CM

## 2023-06-22 DIAGNOSIS — F03918 Unspecified dementia, unspecified severity, with other behavioral disturbance: Secondary | ICD-10-CM | POA: Diagnosis present

## 2023-06-22 DIAGNOSIS — C779 Secondary and unspecified malignant neoplasm of lymph node, unspecified: Secondary | ICD-10-CM | POA: Diagnosis not present

## 2023-06-22 DIAGNOSIS — F0394 Unspecified dementia, unspecified severity, with anxiety: Secondary | ICD-10-CM | POA: Diagnosis present

## 2023-06-22 DIAGNOSIS — D630 Anemia in neoplastic disease: Secondary | ICD-10-CM | POA: Diagnosis not present

## 2023-06-22 DIAGNOSIS — N289 Disorder of kidney and ureter, unspecified: Secondary | ICD-10-CM | POA: Diagnosis not present

## 2023-06-22 DIAGNOSIS — F332 Major depressive disorder, recurrent severe without psychotic features: Secondary | ICD-10-CM | POA: Diagnosis not present

## 2023-06-22 DIAGNOSIS — Z79890 Hormone replacement therapy (postmenopausal): Secondary | ICD-10-CM

## 2023-06-22 DIAGNOSIS — R001 Bradycardia, unspecified: Secondary | ICD-10-CM | POA: Diagnosis present

## 2023-06-22 DIAGNOSIS — I700 Atherosclerosis of aorta: Secondary | ICD-10-CM | POA: Diagnosis not present

## 2023-06-22 DIAGNOSIS — J690 Pneumonitis due to inhalation of food and vomit: Secondary | ICD-10-CM

## 2023-06-22 DIAGNOSIS — I10 Essential (primary) hypertension: Secondary | ICD-10-CM | POA: Diagnosis not present

## 2023-06-22 DIAGNOSIS — R0989 Other specified symptoms and signs involving the circulatory and respiratory systems: Secondary | ICD-10-CM | POA: Diagnosis not present

## 2023-06-22 DIAGNOSIS — F0393 Unspecified dementia, unspecified severity, with mood disturbance: Secondary | ICD-10-CM | POA: Diagnosis present

## 2023-06-22 DIAGNOSIS — Y92009 Unspecified place in unspecified non-institutional (private) residence as the place of occurrence of the external cause: Secondary | ICD-10-CM

## 2023-06-22 DIAGNOSIS — R4589 Other symptoms and signs involving emotional state: Secondary | ICD-10-CM

## 2023-06-22 DIAGNOSIS — Y842 Radiological procedure and radiotherapy as the cause of abnormal reaction of the patient, or of later complication, without mention of misadventure at the time of the procedure: Secondary | ICD-10-CM | POA: Diagnosis present

## 2023-06-22 DIAGNOSIS — R410 Disorientation, unspecified: Secondary | ICD-10-CM | POA: Diagnosis not present

## 2023-06-22 DIAGNOSIS — M25562 Pain in left knee: Secondary | ICD-10-CM | POA: Diagnosis present

## 2023-06-22 DIAGNOSIS — K769 Liver disease, unspecified: Secondary | ICD-10-CM | POA: Diagnosis not present

## 2023-06-22 DIAGNOSIS — G319 Degenerative disease of nervous system, unspecified: Secondary | ICD-10-CM | POA: Diagnosis not present

## 2023-06-22 DIAGNOSIS — M549 Dorsalgia, unspecified: Secondary | ICD-10-CM | POA: Diagnosis present

## 2023-06-22 DIAGNOSIS — Z66 Do not resuscitate: Secondary | ICD-10-CM | POA: Diagnosis not present

## 2023-06-22 DIAGNOSIS — Z90710 Acquired absence of both cervix and uterus: Secondary | ICD-10-CM

## 2023-06-22 DIAGNOSIS — R590 Localized enlarged lymph nodes: Secondary | ICD-10-CM | POA: Diagnosis not present

## 2023-06-22 DIAGNOSIS — Z743 Need for continuous supervision: Secondary | ICD-10-CM | POA: Diagnosis not present

## 2023-06-22 DIAGNOSIS — I959 Hypotension, unspecified: Secondary | ICD-10-CM | POA: Diagnosis not present

## 2023-06-22 DIAGNOSIS — Y92231 Patient bathroom in hospital as the place of occurrence of the external cause: Secondary | ICD-10-CM | POA: Diagnosis not present

## 2023-06-22 LAB — CBC WITH DIFFERENTIAL/PLATELET
Abs Immature Granulocytes: 0.02 10*3/uL (ref 0.00–0.07)
Basophils Absolute: 0 10*3/uL (ref 0.0–0.1)
Basophils Relative: 1 %
Eosinophils Absolute: 0.1 10*3/uL (ref 0.0–0.5)
Eosinophils Relative: 2 %
HCT: 39.2 % (ref 36.0–46.0)
Hemoglobin: 12 g/dL (ref 12.0–15.0)
Immature Granulocytes: 0 %
Lymphocytes Relative: 31 %
Lymphs Abs: 1.7 10*3/uL (ref 0.7–4.0)
MCH: 29.3 pg (ref 26.0–34.0)
MCHC: 30.6 g/dL (ref 30.0–36.0)
MCV: 95.6 fL (ref 80.0–100.0)
Monocytes Absolute: 0.3 10*3/uL (ref 0.1–1.0)
Monocytes Relative: 6 %
Neutro Abs: 3.3 10*3/uL (ref 1.7–7.7)
Neutrophils Relative %: 60 %
Platelets: 181 10*3/uL (ref 150–400)
RBC: 4.1 MIL/uL (ref 3.87–5.11)
RDW: 16.2 % — ABNORMAL HIGH (ref 11.5–15.5)
WBC: 5.4 10*3/uL (ref 4.0–10.5)
nRBC: 0 % (ref 0.0–0.2)

## 2023-06-22 LAB — CBG MONITORING, ED: Glucose-Capillary: 100 mg/dL — ABNORMAL HIGH (ref 70–99)

## 2023-06-22 LAB — RAPID URINE DRUG SCREEN, HOSP PERFORMED
Amphetamines: NOT DETECTED
Barbiturates: NOT DETECTED
Benzodiazepines: POSITIVE — AB
Cocaine: NOT DETECTED
Opiates: NOT DETECTED
Tetrahydrocannabinol: NOT DETECTED

## 2023-06-22 LAB — BLOOD GAS, VENOUS
Acid-Base Excess: 1.8 mmol/L (ref 0.0–2.0)
Bicarbonate: 28.6 mmol/L — ABNORMAL HIGH (ref 20.0–28.0)
O2 Saturation: 59.3 %
Patient temperature: 37
pCO2, Ven: 53 mm[Hg] (ref 44–60)
pH, Ven: 7.34 (ref 7.25–7.43)
pO2, Ven: 36 mm[Hg] (ref 32–45)

## 2023-06-22 LAB — ETHANOL: Alcohol, Ethyl (B): <10 mg/dL (ref <10)

## 2023-06-22 LAB — COMPREHENSIVE METABOLIC PANEL
ALT: 9 U/L (ref 0–44)
AST: 14 U/L — ABNORMAL LOW (ref 15–41)
Albumin: 3.2 g/dL — ABNORMAL LOW (ref 3.5–5.0)
Alkaline Phosphatase: 58 U/L (ref 38–126)
Anion gap: 8 (ref 5–15)
BUN: 16 mg/dL (ref 8–23)
CO2: 26 mmol/L (ref 22–32)
Calcium: 8.7 mg/dL — ABNORMAL LOW (ref 8.9–10.3)
Chloride: 107 mmol/L (ref 98–111)
Creatinine, Ser: 1.09 mg/dL — ABNORMAL HIGH (ref 0.44–1.00)
GFR, Estimated: 53 mL/min — ABNORMAL LOW (ref >60)
Glucose, Bld: 90 mg/dL (ref 70–99)
Potassium: 3.6 mmol/L (ref 3.5–5.1)
Sodium: 141 mmol/L (ref 135–145)
Total Bilirubin: 0.8 mg/dL (ref 0.0–1.2)
Total Protein: 6.7 g/dL (ref 6.5–8.1)

## 2023-06-22 LAB — SALICYLATE LEVEL: Salicylate Lvl: <7 mg/dL — ABNORMAL LOW (ref 7.0–30.0)

## 2023-06-22 LAB — MAGNESIUM: Magnesium: 2.5 mg/dL — ABNORMAL HIGH (ref 1.7–2.4)

## 2023-06-22 LAB — PROCALCITONIN: Procalcitonin: <0.1 ng/mL

## 2023-06-22 LAB — ACETAMINOPHEN LEVEL: Acetaminophen (Tylenol), Serum: <10 ug/mL — ABNORMAL LOW (ref 10–30)

## 2023-06-22 LAB — CK: Total CK: 54 U/L (ref 38–234)

## 2023-06-22 LAB — PHOSPHORUS: Phosphorus: 3.8 mg/dL (ref 2.5–4.6)

## 2023-06-22 LAB — TSH: TSH: 66.588 u[IU]/mL — ABNORMAL HIGH (ref 0.350–4.500)

## 2023-06-22 MED ORDER — SODIUM CHLORIDE 0.9 % IV SOLN
3.0000 g | Freq: Once | INTRAVENOUS | Status: AC
  Administered 2023-06-22: 3 g via INTRAVENOUS
  Filled 2023-06-22: qty 8

## 2023-06-22 MED ORDER — ALBUTEROL SULFATE (2.5 MG/3ML) 0.083% IN NEBU
2.5000 mg | INHALATION_SOLUTION | RESPIRATORY_TRACT | Status: DC | PRN
  Administered 2023-06-22 – 2023-06-28 (×4): 2.5 mg via RESPIRATORY_TRACT
  Filled 2023-06-22 (×4): qty 3

## 2023-06-22 MED ORDER — IOHEXOL 350 MG/ML SOLN
75.0000 mL | Freq: Once | INTRAVENOUS | Status: AC | PRN
  Administered 2023-06-22: 75 mL via INTRAVENOUS

## 2023-06-22 MED ORDER — GUAIFENESIN ER 600 MG PO TB12
600.0000 mg | ORAL_TABLET | Freq: Two times a day (BID) | ORAL | Status: DC
  Administered 2023-06-22 – 2023-07-08 (×31): 600 mg via ORAL
  Filled 2023-06-22 (×32): qty 1

## 2023-06-22 MED ORDER — SODIUM CHLORIDE 0.9 % IV SOLN
3.0000 g | Freq: Three times a day (TID) | INTRAVENOUS | Status: DC
  Administered 2023-06-23: 3 g via INTRAVENOUS
  Filled 2023-06-22: qty 8

## 2023-06-22 MED ORDER — ACETAMINOPHEN 325 MG PO TABS
650.0000 mg | ORAL_TABLET | Freq: Four times a day (QID) | ORAL | Status: DC | PRN
Start: 2023-06-22 — End: 2023-07-08
  Administered 2023-06-23 – 2023-07-04 (×12): 650 mg via ORAL
  Filled 2023-06-22 (×13): qty 2

## 2023-06-22 MED ORDER — ACETAMINOPHEN 650 MG RE SUPP: 650.0000 mg | Freq: Four times a day (QID) | RECTAL | Status: DC | PRN

## 2023-06-22 MED ORDER — METHYLPREDNISOLONE SODIUM SUCC 40 MG IJ SOLR
40.0000 mg | Freq: Two times a day (BID) | INTRAMUSCULAR | Status: DC
  Administered 2023-06-22 – 2023-06-24 (×5): 40 mg via INTRAVENOUS
  Filled 2023-06-22 (×5): qty 1

## 2023-06-22 MED ORDER — LEVOTHYROXINE SODIUM 50 MCG PO TABS
150.0000 ug | ORAL_TABLET | Freq: Every day | ORAL | Status: DC
  Administered 2023-06-24 – 2023-07-08 (×15): 150 ug via ORAL
  Filled 2023-06-22 (×14): qty 1

## 2023-06-22 MED ORDER — PRAVASTATIN SODIUM 40 MG PO TABS
40.0000 mg | ORAL_TABLET | Freq: Every day | ORAL | Status: DC
  Administered 2023-06-23 – 2023-07-04 (×12): 40 mg via ORAL
  Filled 2023-06-22 (×12): qty 1

## 2023-06-22 MED ORDER — SODIUM CHLORIDE 0.9 % IV SOLN: INTRAVENOUS | Status: DC

## 2023-06-22 NOTE — Assessment & Plan Note (Signed)
Atypical add on troponin  Obtain CTA should help with lung visualization too

## 2023-06-22 NOTE — ED Notes (Signed)
Patient transported to CT 

## 2023-06-22 NOTE — Assessment & Plan Note (Signed)
-  will monitor on tele avoid QT prolonging medications, rehydrate correct electrolytes ? ?

## 2023-06-22 NOTE — ED Triage Notes (Signed)
Per ems called out for unresponsiveness and vomiting. Pt was alert when ems arrived, vomit present. Family reported to ems they believe pt took 8-10 5mg  valium because she is "hurting all over" denies SI, pt talking and alert.   Pt currently states "I dont know why I am here" reports "I was just taking a nap" pt states she remembers taking her valium this evening but doesn't remember how many she took

## 2023-06-22 NOTE — Subjective & Objective (Signed)
EMS called for patient being unresponsive and vomiting by the time they arrived patient is alert family states patient took about 8 to 10 tablets of Valium 5 mg each for pain denies suicidal ideation Patient herself unclear why she is here states that she was just trying to take a nap cannot remember how much Valium she took Patient has chronic pain no shortness of breath

## 2023-06-22 NOTE — Assessment & Plan Note (Signed)
Continue Mevacor 20 mg a day

## 2023-06-22 NOTE — Assessment & Plan Note (Signed)
Continue Unasyn Will need swallow eval prior to discharge

## 2023-06-22 NOTE — ED Notes (Signed)
Son at bedside.

## 2023-06-22 NOTE — Assessment & Plan Note (Signed)
Hold off on benzodiazepines at this time Monitor for any sign of oversedation

## 2023-06-22 NOTE — Progress Notes (Signed)
Pharmacy Antibiotic Note  Yolanda Reeves is a 78 y.o. female admitted on 06/22/2023 s/p overdose of diazepam.  Pharmacy has been consulted for Unasyn dosing for aspiration pneumonia.  Plan: Unasyn 3g IV q8h Follow renal function F/u culture results & sensitivities   Height: 5\' 1"  (154.9 cm) Weight: 98.4 kg (216 lb 14.9 oz) IBW/kg (Calculated) : 47.8  Temp (24hrs), Avg:97.7 F (36.5 C), Min:97.5 F (36.4 C), Max:97.8 F (36.6 C)  Recent Labs  Lab 06/22/23 1900  WBC 5.4  CREATININE 1.09*    Estimated Creatinine Clearance: 47.1 mL/min (A) (by C-G formula based on SCr of 1.09 mg/dL (H)).    No Known Allergies  Antimicrobials this admission: 1/16 Unasyn >>      Dose adjustments this admission:    Microbiology results: 1/16 BCx:   1/16 Sputum:       Thank you for allowing pharmacy to be a part of this patient's care.  Maryellen Pile, PharmD 06/22/2023 11:10 PM

## 2023-06-22 NOTE — H&P (Addendum)
Yolanda Reeves:[id number] DOB: 1945-12-25 DOA: 06/22/2023     PCP: Pearline Cables, MD    Urology Dr. Sol Passer Patient arrived to ER on 06/22/23 at 1744 Referred by Attending Lorre Nick, MD   Patient coming from:    home Lives alone,         Chief Complaint:   Chief Complaint  Patient presents with   Drug Overdose    vomiting    HPI: Yolanda Reeves is a 78 y.o. female with medical history significant of chronic pain, hyperlipidemia hypothyroidism tobacco abuse osteopenia, and anxiety depression, kidney stone  Presented with   unintentional Valium overdose  EMS called for patient being unresponsive and vomiting by the time they arrived patient is alert family states patient took about 8 to 10 tablets of Valium 5 mg each for pain denies suicidal ideation Patient herself unclear why she is here states that she was just trying to take a nap cannot remember how much Valium she took Patient has chronic pain no shortness of breath   Reports took valium for sleep  Reports generalized pain    Reports CP /Back pain Denies significant ETOH intake   Does not smoke   Lab Results  Component Value Date   SARSCOV2NAA NEGATIVE 10/13/2020       Regarding pertinent Chronic problems:    Hyperlipidemia - on statins Mevacor (lovastatin)  Lipid Panel     Component Value Date/Time   CHOL 259 (H) 09/19/2016 1423   TRIG 287.0 (H) 09/19/2016 1423   HDL 49.50 09/19/2016 1423   CHOLHDL 5 09/19/2016 1423   VLDL 57.4 (H) 09/19/2016 1423   LDLCALC 161 (H) 08/06/2015 1348   LDLDIRECT 150.0 09/19/2016 1423   Hypothyroidism:   Lab Results  Component Value Date   TSH 2.67 09/19/2016   on synthroid    Morbid obesity-   BMI Readings from Last 1 Encounters:  06/22/23 40.99 kg/m     While in ER:   In emergency department chest x-ray showing worrisome for aspiration pneumonia Started on Unasyn    Lab Orders         CBC with Differential/Platelet         Comprehensive  metabolic panel         Ethanol         Rapid urine drug screen (hospital performed)         Acetaminophen level         Salicylate level         CBG monitoring, ED       CXR -  1. Low lung volumes with mild bibasilar atelectasis. 2. Small patchy area of scarring, atelectasis and/or infiltrate within the periphery of the upper right lung. 3. Opacification of the medial left apex which may represent an additional area of airspace disease.    CTA chest - no PE but does show large mass Large left apical lung mass measuring up to 6.2 cm, concerning for lung carcinoma. Additional spiculated solid mass in the right upper lobe measuring 2.4 cm which may represent metastatic lesion or synchronous lung CA. Additional small left-sided predominant pulmonary nodules measuring up to 6 mm in the left lower lobe are indeterminate for metastatic disease. 3. Enlarged left hilar and AP window lymph nodes, suspicious for metastatic disease.  Following Medications were ordered in ER: Medications  0.9 %  sodium chloride infusion ( Intravenous New Bag/Given 06/22/23 1900)  Ampicillin-Sulbactam (UNASYN) 3 g in sodium chloride 0.9 %  100 mL IVPB (has no administration in time range)     ED Triage Vitals  Encounter Vitals Group     BP 06/22/23 1753 (!) 104/52     Systolic BP Percentile --      Diastolic BP Percentile --      Pulse Rate 06/22/23 1753 65     Resp 06/22/23 1753 20     Temp 06/22/23 1753 (!) 97.5 F (36.4 C)     Temp Source 06/22/23 1753 Oral     SpO2 06/22/23 1753 97 %     Weight 06/22/23 1752 216 lb 14.9 oz (98.4 kg)     Height 06/22/23 1752 5\' 1"  (1.549 m)     Head Circumference --      Peak Flow --      Pain Score --      Pain Loc --      Pain Education --      Exclude from Growth Chart --   ZOXW(96)@     _________________________________________ Significant initial  Findings: Abnormal Labs Reviewed  CBC WITH DIFFERENTIAL/PLATELET - Abnormal; Notable for the following  components:      Result Value   RDW 16.2 (*)    All other components within normal limits  COMPREHENSIVE METABOLIC PANEL - Abnormal; Notable for the following components:   Creatinine, Ser 1.09 (*)    Calcium 8.7 (*)    Albumin 3.2 (*)    AST 14 (*)    GFR, Estimated 53 (*)    All other components within normal limits  RAPID URINE DRUG SCREEN, HOSP PERFORMED - Abnormal; Notable for the following components:   Benzodiazepines POSITIVE (*)    All other components within normal limits  ACETAMINOPHEN LEVEL - Abnormal; Notable for the following components:   Acetaminophen (Tylenol), Serum <10 (*)    All other components within normal limits  SALICYLATE LEVEL - Abnormal; Notable for the following components:   Salicylate Lvl <7.0 (*)    All other components within normal limits  CBG MONITORING, ED - Abnormal; Notable for the following components:   Glucose-Capillary 100 (*)    All other components within normal limits       Cardiac Panel (last 3 results) Recent Labs    06/22/23 1900  CKTOTAL 54     ECG: Ordered Personally reviewed and interpreted by me showing: HR : 64 Rhythm: Sinus rhythm Low voltage, extremity and precordial leads Prolonged QT interval QTC 555     The recent clinical data is shown below. Vitals:   06/22/23 1845 06/22/23 1900 06/22/23 2000 06/22/23 2100  BP: 129/75 (!) 109/91 111/69 (!) 102/53  Pulse: 64 61 65 62  Resp: 13 19 19 15   Temp:    97.8 F (36.6 C)  TempSrc:      SpO2: 90% 93% 92% 93%  Weight:      Height:        WBC     Component Value Date/Time   WBC 5.4 06/22/2023 1900   LYMPHSABS 1.7 06/22/2023 1900   MONOABS 0.3 06/22/2023 1900   EOSABS 0.1 06/22/2023 1900   BASOSABS 0.0 06/22/2023 1900    Lactic Acid, Venous No results found for: "LATICACIDVEN"   Procalcitonin   <0.10 ng/mL       UA  ordered     ABX started Antibiotics Given (last 72 hours)     Date/Time Action Medication Dose Rate   06/22/23 2117 New Bag/Given    Ampicillin-Sulbactam (UNASYN) 3 g in sodium chloride  0.9 % 100 mL IVPB 3 g 200 mL/hr       VBG  ___  7.34 Acid-Base Excess 1.8 mmol/L   pCO2, Ven 53 mmHg O2 Saturation 59.3 %  pO2, Ven 36 mmHg    _________________________________________ Recent Labs  Lab 06/22/23 1900  NA 141  K 3.6  CO2 26  GLUCOSE 90  BUN 16  CREATININE 1.09*  CALCIUM 8.7*    Cr   Up from baseline see below Lab Results  Component Value Date   CREATININE 1.09 (H) 06/22/2023   CREATININE 0.87 10/05/2020   CREATININE 0.77 09/19/2016    Recent Labs  Lab 06/22/23 1900  AST 14*  ALT 9  ALKPHOS 58  BILITOT 0.8  PROT 6.7  ALBUMIN 3.2*   Lab Results  Component Value Date   CALCIUM 8.7 (L) 06/22/2023       Plt: Lab Results  Component Value Date   PLT 181 06/22/2023       Recent Labs  Lab 06/22/23 1900  WBC 5.4  NEUTROABS 3.3  HGB 12.0  HCT 39.2  MCV 95.6  PLT 181    HG/HCT  stable,       Component Value Date/Time   HGB 12.0 06/22/2023 1900   HCT 39.2 06/22/2023 1900   MCV 95.6 06/22/2023 1900    _______________________________________________ Hospitalist was called for admission for   Aspiration into airway, initial encounter, unintended benzodiazepine overdose    The following Work up has been ordered so far:  Orders Placed This Encounter  Procedures   DG Chest Port 1 View   CBC with Differential/Platelet   Comprehensive metabolic panel   Ethanol   Rapid urine drug screen (hospital performed)   Acetaminophen level   Salicylate level   Consult to hospitalist   CBG monitoring, ED   EKG 12-Lead   EKG 12-Lead     OTHER Significant initial  Findings:  labs showing:     DM  labs:  HbA1C: No results for input(s): "HGBA1C" in the last 8760 hours.     CBG (last 3)  Recent Labs    06/22/23 1756  GLUCAP 100*    Cultures:    Component Value Date/Time   SDES URINE, CLEAN CATCH 08/01/2007 0743   SPECREQUEST NONE 08/01/2007 0743   CULT  08/01/2007 0743     Multiple bacterial morphotypes present, none predominant. Suggest appropriate recollection if clinically indicated.   REPTSTATUS 08/03/2007 FINAL 08/01/2007 0743     Radiological Exams on Admission: DG Chest Port 1 View Result Date: 06/22/2023 CLINICAL DATA:  Unresponsive with vomiting. EXAM: PORTABLE CHEST 1 VIEW COMPARISON:  None Available. FINDINGS: The heart size and mediastinal contours are within normal limits. Low lung volumes are noted. Mild atelectatic changes are seen within the bilateral lung bases. A small patchy area of scarring, atelectasis and/or infiltrate is seen within the periphery of the upper right lung. This is increased in severity when compared to the prior study. Opacification of the medial aspect of the left apex is also noted. No pleural effusion or pneumothorax is identified. Multilevel degenerative changes are seen throughout the thoracic spine. IMPRESSION: 1. Low lung volumes with mild bibasilar atelectasis. 2. Small patchy area of scarring, atelectasis and/or infiltrate within the periphery of the upper right lung. 3. Opacification of the medial left apex which may represent an additional area of airspace disease. CT correlation is recommended to further exclude the presence of an underlying pulmonary or mediastinal mass. Electronically Signed   By: Waylan Rocher  [place] M.D.   On: 06/22/2023 20:08   _______________________________________________________________________________________________________ Latest  Blood pressure (!) 102/53, pulse 62, temperature 97.8 F (36.6 C), resp. rate 15, height 5\' 1"  (1.549 m), weight 98.4 kg, SpO2 93%.   Vitals  labs and radiology finding personally reviewed  Review of Systems:    Pertinent positives include:   non-productive cough  Constitutional:  No weight loss, night sweats, Fevers, chills, fatigue, weight loss  HEENT:  No headaches, Difficulty swallowing,Tooth/dental problems,Sore throat,  No sneezing, itching, ear ache, nasal  congestion, post nasal drip,  Cardio-vascular:  No chest pain, Orthopnea, PND, anasarca, dizziness, palpitations.no Bilateral lower extremity swelling  GI:  No heartburn, indigestion, abdominal pain, nausea, vomiting, diarrhea, change in bowel habits, loss of appetite, melena, blood in stool, hematemesis Resp:  no shortness of breath at rest. No dyspnea on exertion, No excess mucus, no productive cough, No, No coughing up of blood.No change in color of mucus.No wheezing. Skin:  no rash or lesions. No jaundice GU:  no dysuria, change in color of urine, no urgency or frequency. No straining to urinate.  No flank pain.  Musculoskeletal:  No joint pain or no joint swelling. No decreased range of motion. No back pain.  Psych:  No change in mood or affect. No depression or anxiety. No memory loss.  Neuro: no localizing neurological complaints, no tingling, no weakness, no double vision, no gait abnormality, no slurred speech, no confusion  All systems reviewed and apart from HOPI all are negative _______________________________________________________________________________________________ Past Medical History:   Past Medical History:  Diagnosis Date   Best dystrophy 2012   Chest pain, atypical    Heart murmur    HYPERLIPIDEMIA    HYPOTHYROIDISM, POST-RADIOACTIVE IODINE    OSTEOPENIA    Sleep apnea    TOBACCO USE    UNSPECIFIED SUBJECTIVE VISUAL DISTURBANCE    VITAMIN D DEFICIENCY    WART, RIGHT HAND       Past Surgical History:  Procedure Laterality Date   ABDOMINAL HYSTERECTOMY     CARPAL TUNNEL RELEASE     EXTRACORPOREAL SHOCK WAVE LITHOTRIPSY Right 10/15/2020   Procedure: EXTRACORPOREAL SHOCK WAVE LITHOTRIPSY (ESWL);  Surgeon: Alfredo Martinez, MD;  Location: [facility];  Service: Urology;  Laterality: Right;   EXTRACORPOREAL SHOCK WAVE LITHOTRIPSY Right 11/19/2020   Procedure: EXTRACORPOREAL SHOCK WAVE LITHOTRIPSY (ESWL);  Surgeon: Jerilee Field, MD;   Location[facility];  Service: Urology;  Laterality: Right;   EYE SURGERY     LAPAROSCOPIC OOPHERECTOMY     LITHOTRIPSY     TONSILLECTOMY      Social History:  Ambulatory   independently     reports that she quit smoking about 12 years ago. Her smoking use included cigarettes. She started smoking about 62 years ago. She has a 50 pack-year smoking history. She has never used smokeless tobacco. She reports current alcohol use. She reports that she does not use drugs.   Family History:   Family History  Problem Relation Age of Onset   Cancer Brother        thyroid   Prostate cancer Brother    Skin cancer Sister    Colon cancer Paternal Grandmother    Thyroid cancer Brother    ________________________________________________________________________________________ Allergies: No Known Allergies   Prior to Admission medications   Medication Sig Start Date End Date Taking? Authorizing Provider  albuterol (VENTOLIN HFA) 108 (90 Base) MCG/ACT inhaler INHALE 2 PUFFS INTO THE LUNGS EVERY 6 HOURS AS NEEDED FOR WHEEZING  OR SHORTNESS OF BREATH 05/10/21   Copland, Gwenlyn Found, MD  diazepam (VALIUM) 5 MG tablet TAKE 1/2 TO 1 TABLET BY  MOUTH DAILY AS NEEDED FOR  ANXIETY 09/18/19   Copland, Gwenlyn Found, MD  ergocalciferol (VITAMIN D2) 50000 units capsule Take 1 capsule (50,000 Units total) by mouth once a week. Reported on 08/05/2015 Patient not taking: Reported on 10/25/2021 09/25/16   Copland, Gwenlyn Found, MD  lamoTRIgine (LAMICTAL) 150 MG tablet TAKE 1 TABLET BY MOUTH  DAILY 06/30/20   Bradd Canary, MD  levothyroxine (SYNTHROID) 125 MCG tablet Take 1 tablet (125 mcg total) by mouth daily before breakfast. Needs appointment with PCP and lab work 12/16/22   Copland, Gwenlyn Found, MD  lovastatin (MEVACOR) 20 MG tablet Take 1 tablet (20 mg total) by mouth at bedtime. NEEDS OFFICE VISIT FOR ADDITIONAL REFILLS 04/19/22   Copland, Gwenlyn Found, MD  meloxicam (MOBIC) 7.5 MG tablet TK 1 T PO QD FOR  JOINT PAIN Patient not taking: Reported on 10/25/2021 12/20/17   [provider]  Omega-3 Fatty Acids (FISH OIL) 500 MG CAPS Take by mouth.    [provider]  ondansetron (ZOFRAN ODT) 4 MG disintegrating tablet Take 1 tablet (4 mg total) by mouth every 8 (eight) hours as needed for nausea or vomiting. 10/05/20   Wynetta Fines, MD  oxyCODONE-acetaminophen (PERCOCET/ROXICET) 5-325 MG tablet Take 1 tablet by mouth every 6 (six) hours as needed for severe pain. Patient not taking: Reported on 10/25/2021 10/05/20   Wynetta Fines, MD  oxyCODONE-acetaminophen (PERCOCET/ROXICET) 5-325 MG tablet Take 1 tablet by mouth every 6 (six) hours as needed for severe pain. Patient not taking: Reported on 10/25/2021 10/05/20   Wynetta Fines, MD  sertraline (ZOLOFT) 100 MG tablet TAKE 1 AND 1/2 TABLETS BY  MOUTH DAILY 06/30/20   Bradd Canary, MD    ___________________________________________________________________________________________________ Physical Exam:    06/22/2023    9:00 PM 06/22/2023    8:00 PM 06/22/2023    7:00 PM  Vitals with BMI  Systolic 102 111 147  Diastolic 53 69 91  Pulse 62 65 61     1. General:  in No  Acute distress   Chronically ill   -appearing 2. Psychological: Alert and   Oriented 3. Head/ENT:    Dry Mucous Membranes                          Head Non traumatic, neck supple                            Poor Dentition 4. SKIN: normal   Skin turgor,  Skin clean Dry and intact no rash    5. Heart: Regular rate and rhythm no  Murmur, no Rub or gallop 6. Lungs:some wheezes and  crackles   7. Abdomen: Soft,  non-tender, Non distended bowel sounds present 8. Lower extremities: no clubbing, cyanosis, no  edema 9. Neurologically Grossly intact, moving all 4 extremities equally  10. MSK: Normal range of motion    Chart has been reviewed  ______________________________________________________________________________________________  Assessment/Plan  78 y.o.  female with medical history significant of chronic pain, hyperlipidemia hypothyroidism tobacco abuse osteopenia, and anxiety depression, kidney stone  Admitted for Aspiration into airway, initial encounter, unintended benzodiazepine overdose   Present on Admission:  Benzodiazepine overdose, accidental or unintentional, initial encounter  HYPOTHYROIDISM, POST-RADIOACTIVE IODINE  Aspiration pneumonia (HCC)  Prolonged QT interval  Chest pain, atypical  Lung mass  COPD with acute exacerbation (HCC)    Benzodiazepine overdose, accidental or unintentional, initial encounter Hold off on benzodiazepines at this time Monitor for any sign of oversedation  HYPERLIPIDEMIA Continue Mevacor 20 mg a day  HYPOTHYROIDISM, POST-RADIOACTIVE IODINE Check TSH and continue Synthroid but increase to  at 150 mg a day  Aspiration pneumonia (HCC) Continue Unasyn Will need swallow eval prior to discharge  Prolonged QT interval - will monitor on tele avoid QT prolonging medications, rehydrate correct electrolytes   Chest pain, atypical Atypical add on troponin  Obtain CTA should help with lung visualization too  Lung mass Discussed with patient She is interested in getting biopsy done  Will order IR consult  May need referral to oncology pending the results  COPD with acute exacerbation (HCC)  -  - Will initiate: Steroid taper  -  Antibiotics Unasyn given possible aspiration - Albuterol  PRN, - scheduled duoneb,  -  Breo or Dulera at discharge   -  Mucinex.  Titrate O2 to saturation >90%. Follow patients respiratory status.  Order nfluenza PCR   VBG non acute    Currently mentating well no evidence of symptomatic hypercarbia   Other plan as per orders.  DVT prophylaxis:  SCD      Code Status:    Code Status: Not on file FULL CODE as per patient   I had personally discussed CODE STATUS with patient  ACP   none   Family Communication:   Family not at  Bedside    Diet npo for  biopsy   Disposition Plan:       To home once workup is complete and patient is stable   Following barriers for discharge:                                                       Electrolytes corrected                                                            Will need to be able to tolerate PO                                    Consult Orders  (From admission, onward)           Start     Ordered   06/22/23 2050  Consult to hospitalist  Once       Provider:  (Not yet assigned)  Question Answer Comment  Place call to: [facility]   Reason for Consult Admit      06/22/23 2049                              Transition of care consulted                   Consults called: none     Admission status:  ED Disposition     ED Disposition  Admit   Condition  --  Comment  The patient appears reasonably stabilized for admission considering the current resources, flow, and capabilities available in the ED at this time, and I doubt any other [facility] requiring further screening and/or treatment in the ED prior to admission is  present.          Obs    Level of care      progressive       tele indefinitely please discontinue once patient no longer qualifies COVID-19 Labs    Wayden Schwertner 06/22/2023, 11:24 PM    [facility]     after 2 AM please page floor coverage PA If 7AM-7PM, please contact the day team taking care of the patient using [email or web address]

## 2023-06-22 NOTE — H&P (Incomplete)
Yolanda Reeves JXB:[id number] DOB: [birth year] DOA: 06/22/2023     PCP: Pearline Cables, MD    Urology Dr. Sol Passer Patient arrived to ER on 06/22/23 at 1744 Referred by Attending Lorre Nick, MD   Patient coming from:    home Lives alone,         Chief Complaint:   Chief Complaint  Patient presents with  . Drug Overdose    vomiting    HPI: Yolanda Reeves is a 78 y.o. female with medical history significant of chronic pain, hyperlipidemia hypothyroidism tobacco abuse osteopenia, and anxiety depression, kidney stone  Presented with   unintentional Valium overdose  EMS called for patient being unresponsive and vomiting by the time they arrived patient is alert family states patient took about 8 to 10 tablets of Valium 5 mg each for pain denies suicidal ideation Patient herself unclear why she is here states that she was just trying to take a nap cannot remember how much Valium she took Patient has chronic pain no shortness of breath   Reports took valium for sleep  Reports generalized pain    Reports CP /Back pain Denies significant ETOH intake   Does not smoke   Lab Results  Component Value Date   SARSCOV2NAA NEGATIVE 10/13/2020       Regarding pertinent Chronic problems:    Hyperlipidemia - on statins Mevacor (lovastatin)  Lipid Panel     Component Value Date/Time   CHOL 259 (H) 09/19/2016 1423   TRIG 287.0 (H) 09/19/2016 1423   HDL 49.50 09/19/2016 1423   CHOLHDL 5 09/19/2016 1423   VLDL 57.4 (H) 09/19/2016 1423   LDLCALC 161 (H) 08/06/2015 1348   LDLDIRECT 150.0 09/19/2016 1423   Hypothyroidism:   Lab Results  Component Value Date   TSH 2.67 09/19/2016   on synthroid    Morbid obesity-   BMI Readings from Last 1 Encounters:  06/22/23 40.99 kg/m     While in ER:   In emergency department chest x-ray showing worrisome for aspiration pneumonia Started on Unasyn    Lab Orders         CBC with Differential/Platelet         Comprehensive  metabolic panel         Ethanol         Rapid urine drug screen (hospital performed)         Acetaminophen level         Salicylate level         CBG monitoring, ED       CXR -  1. Low lung volumes with mild bibasilar atelectasis. 2. Small patchy area of scarring, atelectasis and/or infiltrate within the periphery of the upper right lung. 3. Opacification of the medial left apex which may represent an additional area of airspace disease.    CTA chest - no PE but does show large mass Large left apical lung mass measuring up to 6.2 cm, concerning for lung carcinoma. Additional spiculated solid mass in the right upper lobe measuring 2.4 cm which may represent metastatic lesion or synchronous lung CA. Additional small left-sided predominant pulmonary nodules measuring up to 6 mm in the left lower lobe are indeterminate for metastatic disease. 3. Enlarged left hilar and AP window lymph nodes, suspicious for metastatic disease.  Following Medications were ordered in ER: Medications  0.9 %  sodium chloride infusion ( Intravenous New Bag/Given 06/22/23 1900)  Ampicillin-Sulbactam (UNASYN) 3 g in sodium chloride 0.9 %  100 mL IVPB (has no administration in time range)     ED Triage Vitals  Encounter Vitals Group     BP 06/22/23 1753 (!) 104/52     Systolic BP Percentile --      Diastolic BP Percentile --      Pulse Rate 06/22/23 1753 65     Resp 06/22/23 1753 20     Temp 06/22/23 1753 (!) 97.5 F (36.4 C)     Temp Source 06/22/23 1753 Oral     SpO2 06/22/23 1753 97 %     Weight 06/22/23 1752 216 lb 14.9 oz (98.4 kg)     Height 06/22/23 1752 5\' 1"  (1.549 m)     Head Circumference --      Peak Flow --      Pain Score --      Pain Loc --      Pain Education --      Exclude from Growth Chart --   ZOXW(96)@     _________________________________________ Significant initial  Findings: Abnormal Labs Reviewed  CBC WITH DIFFERENTIAL/PLATELET - Abnormal; Notable for the following  components:      Result Value   RDW 16.2 (*)    All other components within normal limits  COMPREHENSIVE METABOLIC PANEL - Abnormal; Notable for the following components:   Creatinine, Ser 1.09 (*)    Calcium 8.7 (*)    Albumin 3.2 (*)    AST 14 (*)    GFR, Estimated 53 (*)    All other components within normal limits  RAPID URINE DRUG SCREEN, HOSP PERFORMED - Abnormal; Notable for the following components:   Benzodiazepines POSITIVE (*)    All other components within normal limits  ACETAMINOPHEN LEVEL - Abnormal; Notable for the following components:   Acetaminophen (Tylenol), Serum <10 (*)    All other components within normal limits  SALICYLATE LEVEL - Abnormal; Notable for the following components:   Salicylate Lvl <7.0 (*)    All other components within normal limits  CBG MONITORING, ED - Abnormal; Notable for the following components:   Glucose-Capillary 100 (*)    All other components within normal limits       Cardiac Panel (last 3 results) Recent Labs    06/22/23 1900  CKTOTAL 54     ECG: Ordered Personally reviewed and interpreted by me showing: HR : 64 Rhythm: Sinus rhythm Low voltage, extremity and precordial leads Prolonged QT interval QTC 555     The recent clinical data is shown below. Vitals:   06/22/23 1845 06/22/23 1900 06/22/23 2000 06/22/23 2100  BP: 129/75 (!) 109/91 111/69 (!) 102/53  Pulse: 64 61 65 62  Resp: 13 19 19 15   Temp:    97.8 F (36.6 C)  TempSrc:      SpO2: 90% 93% 92% 93%  Weight:      Height:        WBC     Component Value Date/Time   WBC 5.4 06/22/2023 1900   LYMPHSABS 1.7 06/22/2023 1900   MONOABS 0.3 06/22/2023 1900   EOSABS 0.1 06/22/2023 1900   BASOSABS 0.0 06/22/2023 1900    Lactic Acid, Venous No results found for: "LATICACIDVEN"    Procalcitonin   Ordered      UA  ordered     ABX started Antibiotics Given (last 72 hours)     Date/Time Action Medication Dose Rate   06/22/23 2117 New Bag/Given    Ampicillin-Sulbactam (UNASYN) 3 g in sodium chloride 0.9 %  100 mL IVPB 3 g 200 mL/hr       VBG pending __________________________________________________________ Recent Labs  Lab 06/22/23 1900  NA 141  K 3.6  CO2 26  GLUCOSE 90  BUN 16  CREATININE 1.09*  CALCIUM 8.7*    Cr   Up from baseline see below Lab Results  Component Value Date   CREATININE 1.09 (H) 06/22/2023   CREATININE 0.87 10/05/2020   CREATININE 0.77 09/19/2016    Recent Labs  Lab 06/22/23 1900  AST 14*  ALT 9  ALKPHOS 58  BILITOT 0.8  PROT 6.7  ALBUMIN 3.2*   Lab Results  Component Value Date   CALCIUM 8.7 (L) 06/22/2023       Plt: Lab Results  Component Value Date   PLT 181 06/22/2023       Recent Labs  Lab 06/22/23 1900  WBC 5.4  NEUTROABS 3.3  HGB 12.0  HCT 39.2  MCV 95.6  PLT 181    HG/HCT  stable,       Component Value Date/Time   HGB 12.0 06/22/2023 1900   HCT 39.2 06/22/2023 1900   MCV 95.6 06/22/2023 1900    _______________________________________________ Hospitalist was called for admission for   Aspiration into airway, initial encounter, unintended benzodiazepine overdose    The following Work up has been ordered so far:  Orders Placed This Encounter  Procedures  . DG Chest Port 1 View  . CBC with Differential/Platelet  . Comprehensive metabolic panel  . Ethanol  . Rapid urine drug screen (hospital performed)  . Acetaminophen level  . Salicylate level  . Consult to hospitalist  . CBG monitoring, ED  . EKG 12-Lead  . EKG 12-Lead     OTHER Significant initial  Findings:  labs showing:     DM  labs:  HbA1C: No results for input(s): "HGBA1C" in the last 8760 hours.     CBG (last 3)  Recent Labs    06/22/23 1756  GLUCAP 100*    Cultures:    Component Value Date/Time   SDES URINE, CLEAN CATCH 08/01/2007 0743   SPECREQUEST NONE 08/01/2007 0743   CULT  08/01/2007 0743    Multiple bacterial morphotypes present, none predominant. Suggest  appropriate recollection if clinically indicated.   REPTSTATUS 08/03/2007 FINAL 08/01/2007 0743     Radiological Exams on Admission: DG Chest Port 1 View Result Date: 06/22/2023 CLINICAL DATA:  Unresponsive with vomiting. EXAM: PORTABLE CHEST 1 VIEW COMPARISON:  None Available. FINDINGS: The heart size and mediastinal contours are within normal limits. Low lung volumes are noted. Mild atelectatic changes are seen within the bilateral lung bases. A small patchy area of scarring, atelectasis and/or infiltrate is seen within the periphery of the upper right lung. This is increased in severity when compared to the prior study. Opacification of the medial aspect of the left apex is also noted. No pleural effusion or pneumothorax is identified. Multilevel degenerative changes are seen throughout the thoracic spine. IMPRESSION: 1. Low lung volumes with mild bibasilar atelectasis. 2. Small patchy area of scarring, atelectasis and/or infiltrate within the periphery of the upper right lung. 3. Opacification of the medial left apex which may represent an additional area of airspace disease. CT correlation is recommended to further exclude the presence of an underlying pulmonary or mediastinal mass. Electronically Signed   By: Aram Candela M.D.   On: 06/22/2023 20:08   _______________________________________________________________________________________________________ Latest  Blood pressure (!) 102/53, pulse 62, temperature 97.8 F (36.6 C), resp. rate 15, height  5\' 1"  (1.549 m), weight 98.4 kg, SpO2 93%.   Vitals  labs and radiology finding personally reviewed  Review of Systems:    Pertinent positives include:   non-productive cough  Constitutional:  No weight loss, night sweats, Fevers, chills, fatigue, weight loss  HEENT:  No headaches, Difficulty swallowing,Tooth/dental problems,Sore throat,  No sneezing, itching, ear ache, nasal congestion, post nasal drip,  Cardio-vascular:  No chest pain,  Orthopnea, PND, anasarca, dizziness, palpitations.no Bilateral lower extremity swelling  GI:  No heartburn, indigestion, abdominal pain, nausea, vomiting, diarrhea, change in bowel habits, loss of appetite, melena, blood in stool, hematemesis Resp:  no shortness of breath at rest. No dyspnea on exertion, No excess mucus, no productive cough, No, No coughing up of blood.No change in color of mucus.No wheezing. Skin:  no rash or lesions. No jaundice GU:  no dysuria, change in color of urine, no urgency or frequency. No straining to urinate.  No flank pain.  Musculoskeletal:  No joint pain or no joint swelling. No decreased range of motion. No back pain.  Psych:  No change in mood or affect. No depression or anxiety. No memory loss.  Neuro: no localizing neurological complaints, no tingling, no weakness, no double vision, no gait abnormality, no slurred speech, no confusion  All systems reviewed and apart from HOPI all are negative _______________________________________________________________________________________________ Past Medical History:   Past Medical History:  Diagnosis Date  . Best dystrophy 2012  . Chest pain, atypical   . Heart murmur   . HYPERLIPIDEMIA   . HYPOTHYROIDISM, POST-RADIOACTIVE IODINE   . OSTEOPENIA   . Sleep apnea   . TOBACCO USE   . UNSPECIFIED SUBJECTIVE VISUAL DISTURBANCE   . VITAMIN D DEFICIENCY   . WART, RIGHT HAND       Past Surgical History:  Procedure Laterality Date  . ABDOMINAL HYSTERECTOMY    . CARPAL TUNNEL RELEASE    . EXTRACORPOREAL SHOCK WAVE LITHOTRIPSY Right 10/15/2020   Procedure: EXTRACORPOREAL SHOCK WAVE LITHOTRIPSY (ESWL);  Surgeon: Alfredo Martinez, MD;  Location: [facility];  Service: Urology;  Laterality: Right;  . EXTRACORPOREAL SHOCK WAVE LITHOTRIPSY Right 11/19/2020   Procedure: EXTRACORPOREAL SHOCK WAVE LITHOTRIPSY (ESWL);  Surgeon: Jerilee Field, MD;  Location: [facility];  Service:  Urology;  Laterality: Right;  . EYE SURGERY    . LAPAROSCOPIC OOPHERECTOMY    . LITHOTRIPSY    . TONSILLECTOMY      Social History:  Ambulatory   independently     reports that she quit smoking about 12 years ago. Her smoking use included cigarettes. She started smoking about 62 years ago. She has a 50 pack-year smoking history. She has never used smokeless tobacco. She reports current alcohol use. She reports that she does not use drugs.     Family History:   Family History  Problem Relation Age of Onset  . Cancer Brother        thyroid  . Prostate cancer Brother   . Skin cancer Sister   . Colon cancer Paternal Grandmother   . Thyroid cancer Brother    ______________________________________________________________________________________________ Allergies: No Known Allergies   Prior to Admission medications   Medication Sig Start Date End Date Taking? Authorizing Provider  albuterol (VENTOLIN HFA) 108 (90 Base) MCG/ACT inhaler INHALE 2 PUFFS INTO THE LUNGS EVERY 6 HOURS AS NEEDED FOR WHEEZING OR SHORTNESS OF BREATH 05/10/21   Copland, Gwenlyn Found, MD  diazepam (VALIUM) 5 MG tablet TAKE 1/2 TO 1 TABLET BY  MOUTH  DAILY AS NEEDED FOR  ANXIETY 09/18/19   Copland, Gwenlyn Found, MD  ergocalciferol (VITAMIN D2) 50000 units capsule Take 1 capsule (50,000 Units total) by mouth once a week. Reported on 08/05/2015 Patient not taking: Reported on 10/25/2021 09/25/16   Copland, Gwenlyn Found, MD  lamoTRIgine (LAMICTAL) 150 MG tablet TAKE 1 TABLET BY MOUTH  DAILY 06/30/20   Bradd Canary, MD  levothyroxine (SYNTHROID) 125 MCG tablet Take 1 tablet (125 mcg total) by mouth daily before breakfast. Needs appointment with PCP and lab work 12/16/22   Copland, Gwenlyn Found, MD  lovastatin (MEVACOR) 20 MG tablet Take 1 tablet (20 mg total) by mouth at bedtime. NEEDS OFFICE VISIT FOR ADDITIONAL REFILLS 04/19/22   Copland, Gwenlyn Found, MD  meloxicam (MOBIC) 7.5 MG tablet TK 1 T PO QD FOR JOINT PAIN Patient not taking:  Reported on 10/25/2021 12/20/17   [provider]  Omega-3 Fatty Acids (FISH OIL) 500 MG CAPS Take by mouth.    [provider]  ondansetron (ZOFRAN ODT) 4 MG disintegrating tablet Take 1 tablet (4 mg total) by mouth every 8 (eight) hours as needed for nausea or vomiting. 10/05/20   Wynetta Fines, MD  oxyCODONE-acetaminophen (PERCOCET/ROXICET) 5-325 MG tablet Take 1 tablet by mouth every 6 (six) hours as needed for severe pain. Patient not taking: Reported on 10/25/2021 10/05/20   Wynetta Fines, MD  oxyCODONE-acetaminophen (PERCOCET/ROXICET) 5-325 MG tablet Take 1 tablet by mouth every 6 (six) hours as needed for severe pain. Patient not taking: Reported on 10/25/2021 10/05/20   Wynetta Fines, MD  sertraline (ZOLOFT) 100 MG tablet TAKE 1 AND 1/2 TABLETS BY  MOUTH DAILY 06/30/20   Bradd Canary, MD    ___________________________________________________________________________________________________ Physical Exam:    06/22/2023    9:00 PM 06/22/2023    8:00 PM 06/22/2023    7:00 PM  Vitals with BMI  Systolic 102 111 409  Diastolic 53 69 91  Pulse 62 65 61     1. General:  in No  Acute distress   Chronically ill   -appearing 2. Psychological: Alert and   Oriented 3. Head/ENT:    Dry Mucous Membranes                          Head Non traumatic, neck supple                            Poor Dentition 4. SKIN: normal   Skin turgor,  Skin clean Dry and intact no rash    5. Heart: Regular rate and rhythm no  Murmur, no Rub or gallop 6. Lungs:some wheezes and  crackles   7. Abdomen: Soft,  non-tender, Non distended bowel sounds present 8. Lower extremities: no clubbing, cyanosis, no  edema 9. Neurologically Grossly intact, moving all 4 extremities equally  10. MSK: Normal range of motion    Chart has been reviewed  ______________________________________________________________________________________________  Assessment/Plan  78 y.o. female with medical history  significant of chronic pain, hyperlipidemia hypothyroidism tobacco abuse osteopenia, and anxiety depression, kidney stone  Admitted for Aspiration into airway, initial encounter, unintended benzodiazepine overdose    Present on Admission: . Benzodiazepine overdose, accidental or unintentional, initial encounter . HYPOTHYROIDISM, POST-RADIOACTIVE IODINE . Aspiration pneumonia (HCC) . Prolonged QT interval . Chest pain, atypical     Benzodiazepine overdose, accidental or unintentional, initial encounter Hold off on benzodiazepines at this time Monitor for  any sign of oversedation  HYPERLIPIDEMIA Continue Mevacor 20 mg a day  HYPOTHYROIDISM, POST-RADIOACTIVE IODINE Check TSH and continue Synthroid but increase to  at 150 mg a day  Aspiration pneumonia (HCC) Continue Unasyn Will need swallow eval prior to discharge  Prolonged QT interval - will monitor on tele avoid QT prolonging medications, rehydrate correct electrolytes   Chest pain, atypical Atypical add on troponin  Obtain CTA should help with lung visualization too   Other plan as per orders.  DVT prophylaxis:  SCD      Code Status:    Code Status: Not on file FULL CODE as per patient   I had personally discussed CODE STATUS with patient  ACP   none   Family Communication:   Family not at  Bedside    Diet    Disposition Plan:       To home once workup is complete and patient is stable   Following barriers for discharge:                                                       Electrolytes corrected                                                            Will need to be able to tolerate PO                                    Consult Orders  (From admission, onward)           Start     Ordered   06/22/23 2050  Consult to hospitalist  Once       Provider:  (Not yet assigned)  Question Answer Comment  Place call to: [facility]   Reason for Consult Admit      06/22/23 2049                               Transition of care consulted                   Consults called: none     Admission status:  ED Disposition     ED Disposition  Admit   Condition  --   Comment  The patient appears reasonably stabilized for admission considering the current resources, flow, and capabilities available in the ED at this time, and I doubt any other [facility] requiring further screening and/or treatment in the ED prior to admission is  present.          Obs    Level of care      progressive       tele indefinitely please discontinue once patient no longer qualifies COVID-19 Labs    Raju Coppolino 06/22/2023, 11:24 PM    [facility]     after 2 AM please page floor coverage PA If 7AM-7PM, please contact the day team taking care of the patient using [email or web address]

## 2023-06-22 NOTE — ED Provider Notes (Signed)
[facility] AT [facility] Provider Note   CSN: [id number] Arrival date & time: 06/22/23  1744     History  Chief Complaint  Patient presents with   Drug Overdose    vomiting    Yolanda Reeves is a 78 y.o. female.  78 year old female presents after ingesting six 5 mg Valium tablets.  States that she is more anxious and that this was not a suicide attempt.  Has chronic pain in notes that she has been more comfortable.  Denies any use of opiates this evening.  Had 1 episodes of emesis prior to arrival.  Denies any shortness of breath.  EMS was called for unresponsiveness but patient was responsive when they arrived.  Denies any abdominal discomfort.  Denies any HI.       Home Medications Prior to Admission medications   Medication Sig Start Date End Date Taking? Authorizing Provider  albuterol (VENTOLIN HFA) 108 (90 Base) MCG/ACT inhaler INHALE 2 PUFFS INTO THE LUNGS EVERY 6 HOURS AS NEEDED FOR WHEEZING OR SHORTNESS OF BREATH 05/10/21   Copland, Gwenlyn Found, MD  diazepam (VALIUM) 5 MG tablet TAKE 1/2 TO 1 TABLET BY  MOUTH DAILY AS NEEDED FOR  ANXIETY 09/18/19   Copland, Gwenlyn Found, MD  ergocalciferol (VITAMIN D2) 50000 units capsule Take 1 capsule (50,000 Units total) by mouth once a week. Reported on 08/05/2015 Patient not taking: Reported on 10/25/2021 09/25/16   Copland, Gwenlyn Found, MD  lamoTRIgine (LAMICTAL) 150 MG tablet TAKE 1 TABLET BY MOUTH  DAILY 06/30/20   Bradd Canary, MD  levothyroxine (SYNTHROID) 125 MCG tablet Take 1 tablet (125 mcg total) by mouth daily before breakfast. Needs appointment with PCP and lab work 12/16/22   Copland, Gwenlyn Found, MD  lovastatin (MEVACOR) 20 MG tablet Take 1 tablet (20 mg total) by mouth at bedtime. NEEDS OFFICE VISIT FOR ADDITIONAL REFILLS 04/19/22   Copland, Gwenlyn Found, MD  meloxicam (MOBIC) 7.5 MG tablet TK 1 T PO QD FOR JOINT PAIN Patient not taking: Reported on 10/25/2021 12/20/17   [provider]  Omega-3  Fatty Acids (FISH OIL) 500 MG CAPS Take by mouth.    [provider]  ondansetron (ZOFRAN ODT) 4 MG disintegrating tablet Take 1 tablet (4 mg total) by mouth every 8 (eight) hours as needed for nausea or vomiting. 10/05/20   Wynetta Fines, MD  oxyCODONE-acetaminophen (PERCOCET/ROXICET) 5-325 MG tablet Take 1 tablet by mouth every 6 (six) hours as needed for severe pain. Patient not taking: Reported on 10/25/2021 10/05/20   Wynetta Fines, MD  oxyCODONE-acetaminophen (PERCOCET/ROXICET) 5-325 MG tablet Take 1 tablet by mouth every 6 (six) hours as needed for severe pain. Patient not taking: Reported on 10/25/2021 10/05/20   Wynetta Fines, MD  sertraline (ZOLOFT) 100 MG tablet TAKE 1 AND 1/2 TABLETS BY  MOUTH DAILY 06/30/20   Bradd Canary, MD      Allergies    Patient has no known allergies.    Review of Systems   Review of Systems  All other systems reviewed and are negative.   Physical Exam Updated Vital Signs BP (!) 104/52 (BP Location: Right Arm)   Pulse 65   Temp (!) 97.5 F (36.4 C) (Oral)   Resp 20   Ht 1.549 m (5\' 1" )   Wt 98.4 kg   SpO2 97%   BMI 40.99 kg/m  Physical Exam Vitals and nursing note reviewed.  Constitutional:      General: She  is not in acute distress.    Appearance: Normal appearance. She is well-developed. She is not toxic-appearing.  HENT:     Head: Normocephalic and atraumatic.  Eyes:     General: Lids are normal.     Conjunctiva/sclera: Conjunctivae normal.     Pupils: Pupils are equal, round, and reactive to light.  Neck:     Thyroid: No thyroid mass.     Trachea: No tracheal deviation.  Cardiovascular:     Rate and Rhythm: Normal rate and regular rhythm.     Heart sounds: Normal heart sounds. No murmur heard.    No gallop.  Pulmonary:     Effort: Pulmonary effort is normal. No respiratory distress.     Breath sounds: Normal breath sounds. No stridor. No decreased breath sounds, wheezing, rhonchi or rales.  Abdominal:     General:  There is no distension.     Palpations: Abdomen is soft.     Tenderness: There is no abdominal tenderness. There is no rebound.  Musculoskeletal:        General: No tenderness. Normal range of motion.     Cervical back: Normal range of motion and neck supple.  Skin:    General: Skin is warm and dry.     Findings: No abrasion or rash.  Neurological:     General: No focal deficit present.     Mental Status: She is alert and oriented to person, place, and time. Mental status is at baseline.     GCS: GCS eye subscore is 4. GCS verbal subscore is 5. GCS motor subscore is 6.     Cranial Nerves: No cranial nerve deficit.     Sensory: No sensory deficit.     Motor: Motor function is intact.  Psychiatric:        Attention and Perception: Attention normal.        Mood and Affect: Affect is flat.        Speech: Speech is delayed.        Behavior: Behavior is withdrawn.        Thought Content: Thought content does not include homicidal or suicidal ideation.     ED Results / Procedures / Treatments   Labs (all labs ordered are listed, but only abnormal results are displayed) Labs Reviewed  CBG MONITORING, ED - Abnormal; Notable for the following components:      Result Value   Glucose-Capillary 100 (*)    All other components within normal limits  CBC WITH DIFFERENTIAL/PLATELET  COMPREHENSIVE METABOLIC PANEL  ETHANOL  RAPID URINE DRUG SCREEN, HOSP PERFORMED  ACETAMINOPHEN LEVEL  SALICYLATE LEVEL    EKG None  Radiology No results found.  Procedures Procedures    Medications Ordered in ED Medications  0.9 %  sodium chloride infusion (has no administration in time range)    ED Course/ Medical Decision Making/ A&P                                 Medical Decision Making Amount and/or Complexity of Data Reviewed Labs: ordered. Radiology: ordered. ECG/medicine tests: ordered.  Risk Prescription drug management.  Patient's EKG per my interpretation shows sinus  rhythm. Patient here after unintentional benzodiazepine overdose.  Has evidence of aspiration pneumonia on her chest x-ray.  Patient started on IV Unasyn and will be admitted to the hospitalist team        Final Clinical Impression(s) / ED Diagnoses Final  diagnoses:  None    Rx / DC Orders ED Discharge Orders     None         Lorre Nick, MD 06/22/23 2106

## 2023-06-22 NOTE — Assessment & Plan Note (Addendum)
Check TSH and continue Synthroid but increase to  at 150 mg a day

## 2023-06-22 NOTE — ED Notes (Signed)
Pt cleansed from incontinent episode. Placed on purewick and provided with clean brief and sheets. Pt repositioned. Urine specimen sent to lab. Barrier cream applied to groin.

## 2023-06-23 ENCOUNTER — Observation Stay (HOSPITAL_COMMUNITY): Payer: Medicare Other

## 2023-06-23 DIAGNOSIS — J690 Pneumonitis due to inhalation of food and vomit: Secondary | ICD-10-CM | POA: Diagnosis not present

## 2023-06-23 DIAGNOSIS — K769 Liver disease, unspecified: Secondary | ICD-10-CM | POA: Diagnosis not present

## 2023-06-23 DIAGNOSIS — R918 Other nonspecific abnormal finding of lung field: Secondary | ICD-10-CM

## 2023-06-23 DIAGNOSIS — R911 Solitary pulmonary nodule: Secondary | ICD-10-CM | POA: Diagnosis not present

## 2023-06-23 DIAGNOSIS — G319 Degenerative disease of nervous system, unspecified: Secondary | ICD-10-CM | POA: Diagnosis not present

## 2023-06-23 DIAGNOSIS — T424X1A Poisoning by benzodiazepines, accidental (unintentional), initial encounter: Secondary | ICD-10-CM | POA: Diagnosis not present

## 2023-06-23 DIAGNOSIS — I6782 Cerebral ischemia: Secondary | ICD-10-CM | POA: Diagnosis not present

## 2023-06-23 DIAGNOSIS — J441 Chronic obstructive pulmonary disease with (acute) exacerbation: Secondary | ICD-10-CM | POA: Diagnosis present

## 2023-06-23 DIAGNOSIS — R9431 Abnormal electrocardiogram [ECG] [EKG]: Secondary | ICD-10-CM | POA: Diagnosis not present

## 2023-06-23 DIAGNOSIS — C3490 Malignant neoplasm of unspecified part of unspecified bronchus or lung: Secondary | ICD-10-CM | POA: Diagnosis not present

## 2023-06-23 DIAGNOSIS — N289 Disorder of kidney and ureter, unspecified: Secondary | ICD-10-CM | POA: Diagnosis not present

## 2023-06-23 LAB — COMPREHENSIVE METABOLIC PANEL
ALT: 8 U/L (ref 0–44)
AST: 12 U/L — ABNORMAL LOW (ref 15–41)
Albumin: 3.4 g/dL — ABNORMAL LOW (ref 3.5–5.0)
Alkaline Phosphatase: 61 U/L (ref 38–126)
Anion gap: 9 (ref 5–15)
BUN: 13 mg/dL (ref 8–23)
CO2: 24 mmol/L (ref 22–32)
Calcium: 8.7 mg/dL — ABNORMAL LOW (ref 8.9–10.3)
Chloride: 108 mmol/L (ref 98–111)
Creatinine, Ser: 0.94 mg/dL (ref 0.44–1.00)
GFR, Estimated: >60 mL/min (ref >60)
Glucose, Bld: 106 mg/dL — ABNORMAL HIGH (ref 70–99)
Potassium: 3.8 mmol/L (ref 3.5–5.1)
Sodium: 141 mmol/L (ref 135–145)
Total Bilirubin: 0.4 mg/dL (ref 0.0–1.2)
Total Protein: 6.8 g/dL (ref 6.5–8.1)

## 2023-06-23 LAB — PROTIME-INR
INR: 1 (ref 0.8–1.2)
Prothrombin Time: 13.2 s (ref 11.4–15.2)

## 2023-06-23 LAB — MAGNESIUM: Magnesium: 2.3 mg/dL (ref 1.7–2.4)

## 2023-06-23 LAB — ECHOCARDIOGRAM COMPLETE
Area-P 1/2: 2.69 cm2
Calc EF: 72.6 %
Height: 61 in
S' Lateral: 2.9 cm
Single Plane A2C EF: 76.5 %
Single Plane A4C EF: 69.5 %
Weight: 3470.92 [oz_av]

## 2023-06-23 LAB — CBC
HCT: 38.1 % (ref 36.0–46.0)
Hemoglobin: 11.9 g/dL — ABNORMAL LOW (ref 12.0–15.0)
MCH: 30.2 pg (ref 26.0–34.0)
MCHC: 31.2 g/dL (ref 30.0–36.0)
MCV: 96.7 fL (ref 80.0–100.0)
Platelets: 170 10*3/uL (ref 150–400)
RBC: 3.94 MIL/uL (ref 3.87–5.11)
RDW: 16.2 % — ABNORMAL HIGH (ref 11.5–15.5)
WBC: 5.8 10*3/uL (ref 4.0–10.5)
nRBC: 0 % (ref 0.0–0.2)

## 2023-06-23 LAB — STREP PNEUMONIAE URINARY ANTIGEN: Strep Pneumo Urinary Antigen: NEGATIVE

## 2023-06-23 LAB — HIV ANTIBODY (ROUTINE TESTING W REFLEX): HIV Screen 4th Generation wRfx: NONREACTIVE

## 2023-06-23 LAB — T4, FREE: Free T4: 0.34 ng/dL — ABNORMAL LOW (ref 0.61–1.12)

## 2023-06-23 LAB — PREALBUMIN: Prealbumin: 22 mg/dL (ref 18–38)

## 2023-06-23 LAB — PHOSPHORUS: Phosphorus: 3.6 mg/dL (ref 2.5–4.6)

## 2023-06-23 MED ORDER — SODIUM CHLORIDE 0.9 % IV SOLN
3.0000 g | Freq: Four times a day (QID) | INTRAVENOUS | Status: DC
  Administered 2023-06-23 – 2023-06-25 (×8): 3 g via INTRAVENOUS
  Filled 2023-06-23 (×7): qty 8

## 2023-06-23 MED ORDER — IBUPROFEN 200 MG PO TABS
600.0000 mg | ORAL_TABLET | Freq: Four times a day (QID) | ORAL | Status: AC | PRN
  Administered 2023-06-24 – 2023-06-25 (×3): 600 mg via ORAL
  Filled 2023-06-23 (×3): qty 3

## 2023-06-23 MED ORDER — IOHEXOL 300 MG/ML  SOLN
100.0000 mL | Freq: Once | INTRAMUSCULAR | Status: AC | PRN
  Administered 2023-06-23: 100 mL via INTRAVENOUS

## 2023-06-23 MED ORDER — GADOBUTROL 1 MMOL/ML IV SOLN
10.0000 mL | Freq: Once | INTRAVENOUS | Status: AC | PRN
  Administered 2023-06-23: 10 mL via INTRAVENOUS

## 2023-06-23 NOTE — Progress Notes (Signed)
PHARMACY NOTE:  ANTIMICROBIAL RENAL DOSAGE ADJUSTMENT  Current antimicrobial regimen includes a mismatch between antimicrobial dosage and estimated renal function.  As per policy approved by the Pharmacy & Therapeutics and Medical Executive Committees, the antimicrobial dosage will be adjusted accordingly.  Current antimicrobial dosage:  Unasyn 3g q8  Indication: aspiration PNA  Renal Function:  Estimated Creatinine Clearance: 54.7 mL/min (by C-G formula based on SCr of 0.94 mg/dL). []      On intermittent HD, scheduled: []      On CRRT    Antimicrobial dosage has been changed to:  Unasyn 3g q6  Additional comments:   Thank you for allowing pharmacy to be a part of this patient's care.  Berkley Harvey, [facility] 06/23/2023 10:45 AM

## 2023-06-23 NOTE — Progress Notes (Signed)
SLP Cancellation Note  Patient Details Name: Yolanda Reeves MRN: [id number] DOB: August 26, 1945   Cancelled treatment:       Reason Eval/Treat Not Completed: (P) Other (comment) (pt passed yale stroke swallow screen)  Rolena Infante, MS [facility] SLP Acute Rehab Services Office [phone]  Chales Abrahams 06/23/2023, 8:18 AM

## 2023-06-23 NOTE — Progress Notes (Addendum)
[facility] PROGRESS NOTE   Yolanda Reeves NWG:[id number] DOB: 1945-12-06 DOA: 06/22/2023  PCP: Pearline Cables, MD  Brief History: 78 y.o. female with medical history significant of chronic pain, hyperlipidemia hypothyroidism tobacco abuse osteopenia, and anxiety depression, kidney stone who was brought in by EMS after she was found to be unresponsive by family member.  She was apparently vomiting as well.  She had taken 8 to 10 tablets of Valium because she could not sleep.  She denied suicidal intent.  Workup in the emergency department raised concern for aspiration pneumonia and also revealed masses in the lung.  She was hospitalized for further management.  Consultants: Pulmonology.  Interventional radiology  Procedures: Echocardiogram is pending    Subjective/Interval History: Patient denies any chest pain.  Has a cough.  Mild shortness of breath.  No headaches.  No nausea or vomiting currently.    Assessment/Plan:  Aspiration pneumonia with concern for acute bronchitis/history of COPD Likely as a result of the unintentional overdose. She has been started on Unasyn which will be continued.  Swallow evaluation. Just on albuterol inhaler at home.  Not on any maintenance treatment. Not requiring any oxygen currently.  Unintentional benzodiazepine overdose She took 8 to 10 tablets of Valium in order to go to sleep.  Denied any suicidal ideation.  Holding benzodiazepines for now.  She is now fully awake and alert. Not certain how she acquired the Valium.  No sedative or narcotic prescription data is noted on the prescriber database.  She has not been prescribed any of these medications recently, at least locally.  Lung mass She underwent a CT angiogram which was negative for PE but showed a large left apical lung mass measuring up to 6.2 cm.  Additional spiculated solid mass in the right upper lobe measuring 2.4 cm.  Pulmonary nodules on the left side were also noted.   Enlarged left hilar and AP window lymph nodes were also noted.  She is a smoker.  Used to smoke cigarettes and now she vapes. Will need definitive diagnosis.  IR was consulted by admitting provider.  Have discussed with pulmonology who will consult as well. Proceed with CT scan of the abdomen pelvis and MRI brain to rule out metastatic disease.  Hypothyroidism Supposed to be on 125 mcg of Synthroid on a daily basis. Her TSH was noted to be 66.5.  Free T4 noted to be 0.34.  Compliance is questionable.  Dose of levothyroxine has been increased.  Thyroid function test will need to be rechecked in a few weeks.  Hyperlipidemia Continue statin.  QT prolongation Monitor on telemetry.  Avoid QT prolonging medications.  Atypical chest pain Likely due to lung mass.  No ischemic changes on EKG.  Follow-up on echocardiogram.  Normocytic anemia Dilutional drop in hemoglobin noted.  Recheck labs in the morning.  Class III obesity Estimated body mass index is 40.99 kg/m as calculated from the following:   Height as of this encounter: 5\' 1"  (1.549 m).   Weight as of this encounter: 98.4 kg.   DVT Prophylaxis: SCDs only for now in case of procedure needs to be performed Code Status: Full code Family Communication: Discussed with the patient Disposition Plan: To be determined  Status is: Observation The patient will require care spanning > 2 midnights and should be moved to inpatient because: Aspiration pneumonia requiring IV antibiotics      Medications: Scheduled:  guaiFENesin  600 mg Oral BID   levothyroxine  150 mcg Oral QAC  breakfast   methylPREDNISolone (SOLU-MEDROL) injection  40 mg Intravenous Q12H   pravastatin  40 mg Oral q1800   Continuous:  sodium chloride Stopped (06/22/23 2141)   ampicillin-sulbactam (UNASYN) IV Stopped (06/23/23 0515)   UJW:[id number] **OR** acetaminophen, albuterol, iohexol  Antibiotics: Anti-infectives (From admission, onward)    Start      Dose/Rate Route Frequency Ordered Stop   06/23/23 0500  Ampicillin-Sulbactam (UNASYN) 3 g in sodium chloride 0.9 % 100 mL IVPB        3 g 200 mL/hr over 30 Minutes Intravenous Every 8 hours 06/22/23 2308     06/22/23 2045  Ampicillin-Sulbactam (UNASYN) 3 g in sodium chloride 0.9 % 100 mL IVPB        3 g 200 mL/hr over 30 Minutes Intravenous  Once 06/22/23 2042 06/22/23 2147       Objective:  Vital Signs  Vitals:   06/23/23 0400 06/23/23 0430 06/23/23 0500 06/23/23 0600  BP: 122/82  133/83 137/84  Pulse: 66 64 65 64  Resp: 20 13 14 18   Temp:  97.8 F (36.6 C)    TempSrc:  Oral    SpO2: 95% 92% 94% 94%  Weight:      Height:        Intake/Output Summary (Last 24 hours) at 06/23/2023 0934 Last data filed at 06/22/2023 2030 Gross per 24 hour  Intake --  Output 30 ml  Net -30 ml   Filed Weights   06/22/23 1752  Weight: 98.4 kg    General appearance: Awake alert.  In no distress Resp: Scattered wheezing with crackles at the bases.  No rhonchi.  Normal effort at rest. Cardio: S1-S2 is normal regular.  No S3-S4.  No rubs murmurs or bruit GI: Abdomen is soft.  Nontender nondistended.  Bowel sounds are present normal.  No masses organomegaly Extremities: No edema.  Full range of motion of lower extremities. Neurologic: Alert and oriented x3.  No focal neurological deficits.    Lab Results:  Data Reviewed: I have personally reviewed following labs and reports of the imaging studies  CBC: Recent Labs  Lab 06/22/23 1900 06/23/23 0443  WBC 5.4 5.8  NEUTROABS 3.3  --   HGB 12.0 11.9*  HCT 39.2 38.1  MCV 95.6 96.7  PLT 181 170    Basic Metabolic Panel: Recent Labs  Lab 06/22/23 1900 06/23/23 0443  NA 141 141  K 3.6 3.8  CL 107 108  CO2 26 24  GLUCOSE 90 106*  BUN 16 13  CREATININE 1.09* 0.94  CALCIUM 8.7* 8.7*  MG 2.5* 2.3  PHOS 3.8 3.6    GFR: Estimated Creatinine Clearance: 54.7 mL/min (by C-G formula based on SCr of 0.94 mg/dL).  Liver Function  Tests: Recent Labs  Lab 06/22/23 1900 06/23/23 0443  AST 14* 12*  ALT 9 8  ALKPHOS 58 61  BILITOT 0.8 0.4  PROT 6.7 6.8  ALBUMIN 3.2* 3.4*    Coagulation Profile: Recent Labs  Lab 06/22/23 2343  INR 1.0    Cardiac Enzymes: Recent Labs  Lab 06/22/23 1900  CKTOTAL 54    CBG: Recent Labs  Lab 06/22/23 1756  GLUCAP 100*    Thyroid Function Tests: Recent Labs    06/22/23 1900 06/22/23 2343  TSH 66.588*  --   FREET4  --  0.34*     Recent Results (from the past 240 hours)  Culture, blood (routine x 2) Call MD if unable to obtain prior to antibiotics being given  Status: None (Preliminary result)   Collection Time: 06/22/23 11:00 PM   Specimen: BLOOD RIGHT HAND  Result Value Ref Range Status   Specimen Description   Final    BLOOD RIGHT HAND Performed at [facility] [address] [place], [place] [address]    Special Requests   Final    BOTTLES DRAWN AEROBIC AND ANAEROBIC Blood Culture results may not be optimal due to an inadequate volume of blood received in culture bottles Performed at [facility], [address] [place], [place] [address]    Culture   Final    NO GROWTH < 12 HOURS Performed at [facility], [address], [place], [place] [address]    Report Status PENDING  Incomplete  Culture, blood (routine x 2) Call MD if unable to obtain prior to antibiotics being given     Status: None (Preliminary result)   Collection Time: 06/22/23 11:15 PM   Specimen: BLOOD LEFT HAND  Result Value Ref Range Status   Specimen Description   Final    BLOOD LEFT HAND Performed at [facility], [address], [place], [place] [address]    Special Requests   Final    BOTTLES DRAWN AEROBIC AND ANAEROBIC Blood Culture results may not be optimal due to an inadequate volume of blood received in culture bottles Performed at [facility], [address], [place], [place]  [address]    Culture   Final    NO GROWTH < 12 HOURS Performed at [facility], [address] [place], [place] [address]    Report Status PENDING  Incomplete      Radiology Studies: CT Angio Chest Pulmonary Embolism (PE) W or WO Contrast Result Date: 06/22/2023 CLINICAL DATA:  Unresponsive vomiting EXAM: CT ANGIOGRAPHY CHEST WITH CONTRAST TECHNIQUE: Multidetector CT imaging of the chest was performed using the standard protocol during bolus administration of intravenous contrast. Multiplanar CT image reconstructions and MIPs were obtained to evaluate the vascular anatomy. RADIATION DOSE REDUCTION: This exam was performed according to the departmental dose-optimization program which includes automated exposure control, adjustment of the mA and/or kV according to patient size and/or use of iterative reconstruction technique. CONTRAST:  75mL OMNIPAQUE IOHEXOL 350 MG/ML SOLN COMPARISON:  06/22/2023 chest x-ray FINDINGS: Cardiovascular: Satisfactory opacification of the pulmonary arteries to the segmental level. No evidence of pulmonary embolism. Moderate aortic atherosclerosis. No aneurysm. Normal cardiac size. Small pericardial effusion. Coronary vascular calcification. Mediastinum/Nodes: Patent trachea. No suspicious thyroid mass. Enlarged AP window lymph nodes measuring up to 17 mm on series 4, image 39. Multiple enlarged left hilar nodes measuring up to 17 mm. Esophagus within normal limits. Small hiatal hernia Lungs/Pleura: No pleural effusion or pneumothorax. Large left apical lung mass measuring approximately 6.2 by 4.6 by 4.5 cm on series 4, image 22 and series 6, image 102. Additional spiculated right upper lobe solid mass measuring 2.4 by 1.7 by 1.6 cm on series 12, image 32 and coronal series 6, image 92. Additional left lower lobe pulmonary nodule measuring 6 mm on series 12, image 60 and 3 mm left upper lobe pulmonary nodule on series 12, image 43. Scattered hazy pulmonary densities could be  due to atelectasis or small airways disease. Upper Abdomen: No acute finding Musculoskeletal: No acute or suspicious osseous abnormality. Review of the MIP images confirms the above findings. IMPRESSION: 1. Negative for acute pulmonary embolus. 2. Large left apical lung mass measuring up to 6.2 cm,  concerning for lung carcinoma. Additional spiculated solid mass in the right upper lobe measuring 2.4 cm which may represent metastatic lesion or synchronous lung CA. Additional small left-sided predominant pulmonary nodules measuring up to 6 mm in the left lower lobe are indeterminate for metastatic disease. 3. Enlarged left hilar and AP window lymph nodes, suspicious for metastatic disease. 4. Aortic atherosclerosis. Aortic Atherosclerosis (ICD10-I70.0). Electronically Signed   By: Jasmine Pang M.D.   On: 06/22/2023 23:48   DG Chest Port 1 View Result Date: 06/22/2023 CLINICAL DATA:  Unresponsive with vomiting. EXAM: PORTABLE CHEST 1 VIEW COMPARISON:  None Available. FINDINGS: The heart size and mediastinal contours are within normal limits. Low lung volumes are noted. Mild atelectatic changes are seen within the bilateral lung bases. A small patchy area of scarring, atelectasis and/or infiltrate is seen within the periphery of the upper right lung. This is increased in severity when compared to the prior study. Opacification of the medial aspect of the left apex is also noted. No pleural effusion or pneumothorax is identified. Multilevel degenerative changes are seen throughout the thoracic spine. IMPRESSION: 1. Low lung volumes with mild bibasilar atelectasis. 2. Small patchy area of scarring, atelectasis and/or infiltrate within the periphery of the upper right lung. 3. Opacification of the medial left apex which may represent an additional area of airspace disease. CT correlation is recommended to further exclude the presence of an underlying pulmonary or mediastinal mass. Electronically Signed   By: Aram Candela M.D.   On: 06/22/2023 20:08       LOS: 0 days   Korri Ask Rito Ehrlich  [facility] Pager on [email or web address]  06/23/2023, 9:34 AM

## 2023-06-23 NOTE — Evaluation (Signed)
Physical Therapy Evaluation Patient Details Name: Yolanda Reeves MRN: [id number] DOB: November 14, 1945 Today's Date: 06/23/2023  History of Present Illness  Yolanda Reeves is a 78 yo female admitted with unintentional benzodiazepine overdose. PMH: hyperlipidemia, hypothyroidism, chronic pain, deaf, blind, and anxiety.  Clinical Impression  Pt admitted with above diagnosis. Pt from home alone, ind with blind cane, does endorse 5 falls in 6 months, family provides meals and pt able to microwave meals. Pt reports R knee buckling occasionally causing falls, sometimes able to get up alone and sometimes needs assistance. Pt currently completed bed mobility and transfers with CGA, +2 for safety, guiding cues due vision deficits. Anticipate HHPT with family support at d/c, if family unable to provide assistance may need to consider SNF prior to return home alone. Pt currently with functional limitations due to the deficits listed below (see PT Problem List). Pt will benefit from acute skilled PT to increase their independence and safety with mobility to allow discharge.           If plan is discharge home, recommend the following: A little help with walking and/or transfers;A little help with bathing/dressing/bathroom;Assistance with cooking/housework;Assist for transportation   Can travel by private vehicle        Equipment Recommendations None recommended by PT  Recommendations for Other Services       Functional Status Assessment Patient has had a recent decline in their functional status and demonstrates the ability to make significant improvements in function in a reasonable and predictable amount of time.     Precautions / Restrictions Precautions Precautions: Fall Precaution Comments: blind Restrictions Weight Bearing Restrictions Per Provider Order: No      Mobility  Bed Mobility Overal bed mobility: Needs Assistance Bed Mobility: Supine to Sit     Supine to sit: Contact guard      General bed mobility comments: slow to mobilize, therapists managing lines for safety    Transfers Overall transfer level: Needs assistance Equipment used: 1 person hand held assist Transfers: Sit to/from Stand, Bed to chair/wheelchair/BSC Sit to Stand: Contact guard assist, +2 safety/equipment   Step pivot transfers: Contact guard assist, +2 safety/equipment       General transfer comment: guiding cues for hand placeent and w/c at bedside for step pivot transfer, pt reports R knee buckling at times but not noted this encounter    Ambulation/Gait               General Gait Details: pt able to take steps at bedside and over to w/c, being transported from ED up to floor, HHA for vision deficits  Stairs            Wheelchair Mobility     Tilt Bed    Modified Rankin (Stroke Patients Only)       Balance Overall balance assessment: Needs assistance, History of Falls Sitting-balance support: Feet supported Sitting balance-Leahy Scale: Good     Standing balance support: During functional activity, Single extremity supported Standing balance-Leahy Scale: Fair                               Pertinent Vitals/Pain Pain Assessment Pain Assessment: No/denies pain    Home Living Family/patient expects to be discharged to:: Private residence Living Arrangements: Alone   Type of Home: House Home Access: Level entry       Home Layout: One level Home Equipment: Other (comment);Lift chair (white cane) Additional Comments: sleeps in  recliner/lift chair    Prior Function Prior Level of Function : Independent/Modified Independent;History of Falls (last six months)             Mobility Comments: pt reports using blind cane. does not drive. Has two dogs. 5 falls in the past 6 months ADLs Comments: Pt completes her own meals - reports microwave meals are the best. Family will also bring meals.     Extremity/Trunk Assessment   Upper Extremity  Assessment Upper Extremity Assessment: Defer to OT evaluation    Lower Extremity Assessment Lower Extremity Assessment: Generalized weakness (AROM WFL, strength grossly 3+/5)    Cervical / Trunk Assessment Cervical / Trunk Assessment: Normal  Communication   Communication Communication: Hearing impairment  Cognition Arousal: Alert Behavior During Therapy: WFL for tasks assessed/performed Overall Cognitive Status: Within Functional Limits for tasks assessed                                 General Comments: pt oriented to self, hospital, DOB, follows commands appropriately        General Comments      Exercises     Assessment/Plan    PT Assessment Patient needs continued PT services  PT Problem List Decreased strength;Decreased activity tolerance;Decreased balance;Decreased mobility;Decreased safety awareness;Decreased cognition       PT Treatment Interventions DME instruction;Gait training;Functional mobility training;Therapeutic activities;Therapeutic exercise;Balance training;Neuromuscular re-education;Patient/family education    PT Goals (Current goals can be found in the Care Plan section)  Acute Rehab PT Goals Patient Stated Goal: return home PT Goal Formulation: With patient/family Time For Goal Achievement: 07/07/23 Potential to Achieve Goals: Good    Frequency Min 1X/week     Co-evaluation PT/OT/SLP Co-Evaluation/Treatment: Yes Reason for Co-Treatment: For patient/therapist safety;To address functional/ADL transfers PT goals addressed during session: Mobility/safety with mobility;Balance         AM-PAC PT "6 Clicks" Mobility  Outcome Measure Help needed turning from your back to your side while in a flat bed without using bedrails?: A Little Help needed moving from lying on your back to sitting on the side of a flat bed without using bedrails?: A Little Help needed moving to and from a bed to a chair (including a wheelchair)?: A  Little Help needed standing up from a chair using your arms (e.g., wheelchair or bedside chair)?: A Little Help needed to walk in hospital room?: A Lot Help needed climbing 3-5 steps with a railing? : A Lot 6 Click Score: 16    End of Session   Activity Tolerance: Patient tolerated treatment well Patient left: Other (comment) (in w/c being transported to floor) Nurse Communication: Mobility status PT Visit Diagnosis: Muscle weakness (generalized) (M62.81)    Time: 1610-9604 PT Time Calculation (min) (ACUTE ONLY): 17 min   Charges:   PT Evaluation $PT Eval Low Complexity: 1 Low   PT General Charges $$ ACUTE PT VISIT: 1 Visit         Tori Quinlan Mcfall PT, DPT 06/23/23, 1:29 PM

## 2023-06-23 NOTE — Assessment & Plan Note (Signed)
Discussed with patient She is interested in getting biopsy done  Will order IR consult  May need referral to oncology pending the results

## 2023-06-23 NOTE — Progress Notes (Signed)
Notified by MRI tech that patient would not be able to have second MRI until 24hrs passed, to allow contrast to move throughout her system. Patient went for MRI of brain around 1400 today.

## 2023-06-23 NOTE — ED Notes (Signed)
Contacted IR, they informed me that they decided to no go through with the biopsy and pt could have something to drink

## 2023-06-23 NOTE — ED Notes (Signed)
Patient passed swallow test at 0000 RN contacted MD to see if patient will she remain NPO

## 2023-06-23 NOTE — Consult Note (Signed)
NAME:  Yolanda Reeves, MRN:  [id number], DOB:  07/28/1945, LOS: 0 ADMISSION DATE:  06/22/2023, CONSULTATION DATE: 1/17 REFERRING MD: Dr. Rito Ehrlich, CHIEF COMPLAINT: Lung mass  History of Present Illness:  78 year old female with past medical history as below, which is significant for hyperlipidemia, hypothyroidism, chronic pain, deaf, blind, and anxiety.  She was in her usual state of health until 1/17 when she was found to be unresponsive by family members and EMS was called.  She was also found to have vomited.  Upon arrival to the emergency department she was somewhat more lucid and admitted to taking 10 tablets of Valium because she has had significant anxiety recently and was attempting to sleep.  She reports taking them over period of hours when she did not appreciate the desired effect of the previous doses.  She does not recall being brought to the emergency department.  Denied suicidal ideation.  Workup in the emergency department was concerning for aspiration pneumonia and chest x-ray demonstrated possible lung mass in the right upper lobe.  CT angiogram of the chest was negative for pulmonary embolism, but did describe a large left apical lung mass measuring 6.2 cm in addition to a spiculated solid mass in the right upper lobe measuring 2.4 cm and additional left-sided pulmonary nodules.  Enlarged left hilar lymph nodes also noted.  IR was consulted for possible biopsy of the left apical lung mass, however, they recommended pulmonary consultation due to poor target for percutaneous biopsy.  PCCM's been asked to see.  Pertinent  Medical History   has a past medical history of Best dystrophy (2012), Chest pain, atypical, Heart murmur, HYPERLIPIDEMIA, HYPOTHYROIDISM, POST-RADIOACTIVE IODINE, OSTEOPENIA, Sleep apnea, TOBACCO USE, UNSPECIFIED SUBJECTIVE VISUAL DISTURBANCE, VITAMIN D DEFICIENCY, and WART, RIGHT HAND.   Significant Hospital Events: Including procedures, antibiotic start and stop  dates in addition to other pertinent events     Interim History / Subjective:    Objective   Blood pressure (!) 156/91, pulse 60, temperature 97.6 F (36.4 C), temperature source Oral, resp. rate 18, height 5\' 1"  (1.549 m), weight 98.4 kg, SpO2 98%.        Intake/Output Summary (Last 24 hours) at 06/23/2023 1211 Last data filed at 06/22/2023 2030 Gross per 24 hour  Intake --  Output 30 ml  Net -30 ml   Filed Weights   06/22/23 1752  Weight: 98.4 kg    Examination: General: Overweight elderly appearing female in no acute distress HENT: Normocephalic, atraumatic, no JVD Lungs: Clear bilateral breath sounds Cardiovascular: Regular rate and rhythm Abdomen: Soft, nontender, nondistended Extremities: No acute deformity or range of motion limitation Neuro: Alert, oriented, nonfocal  Resolved Hospital Problem list     Assessment & Plan:   Large left upper lobe lung mass: 6.2 cm abutting the mediastinum.  Also spiculated right upper lobe smaller mass and other left-sided nodules.  Some hilar lymphadenopathy also noted. She has a 50 pack year cigarette smoking history. Has been vaping since she quit for the last 8 years.  -Agree this would be an difficult approach percutaneously -Would likely be amenable to navigational bronchoscopy in the left upper lobe and the hilar nodes may be EBUS targets.  Will discuss with our IP team.  - Smoking/vaping  cessation counseling.   Possible aspiration: imaging negative. Clinically no s/s - Supportive care - Consider discontinuing antibiotics.   Unintentional overdose of benzodiazepine: she tells me the valium was prescribed to her. - per primary  Deaf/Blind   Best Practice (  right click and "Reselect all SmartList Selections" daily)   Per primary  Labs   CBC: Recent Labs  Lab 06/22/23 1900 06/23/23 0443  WBC 5.4 5.8  NEUTROABS 3.3  --   HGB 12.0 11.9*  HCT 39.2 38.1  MCV 95.6 96.7  PLT 181 170    Basic Metabolic  Panel: Recent Labs  Lab 06/22/23 1900 06/23/23 0443  NA 141 141  K 3.6 3.8  CL 107 108  CO2 26 24  GLUCOSE 90 106*  BUN 16 13  CREATININE 1.09* 0.94  CALCIUM 8.7* 8.7*  MG 2.5* 2.3  PHOS 3.8 3.6   GFR: Estimated Creatinine Clearance: 54.7 mL/min (by C-G formula based on SCr of 0.94 mg/dL). Recent Labs  Lab 06/22/23 1900 06/23/23 0443  PROCALCITON <0.10  --   WBC 5.4 5.8    Liver Function Tests: Recent Labs  Lab 06/22/23 1900 06/23/23 0443  AST 14* 12*  ALT 9 8  ALKPHOS 58 61  BILITOT 0.8 0.4  PROT 6.7 6.8  ALBUMIN 3.2* 3.4*   No results for input(s): "LIPASE", "AMYLASE" in the last 168 hours. No results for input(s): "AMMONIA" in the last 168 hours.  ABG    Component Value Date/Time   HCO3 28.6 (H) 06/22/2023 2343   O2SAT 59.3 06/22/2023 2343     Coagulation Profile: Recent Labs  Lab 06/22/23 2343  INR 1.0    Cardiac Enzymes: Recent Labs  Lab 06/22/23 1900  CKTOTAL 54    HbA1C: Hgb A1c MFr Bld  Date/Time Value Ref Range Status  09/19/2016 02:23 PM 5.5 4.6 - 6.5 % Final    Comment:    Glycemic Control Guidelines for People with Diabetes:Non Diabetic:  <6%Goal of Therapy: <7%Additional Action Suggested:  >8%   08/06/2015 01:48 PM 5.5 4.6 - 6.5 % Final    Comment:    Glycemic Control Guidelines for People with Diabetes:Non Diabetic:  <6%Goal of Therapy: <7%Additional Action Suggested:  >8%     CBG: Recent Labs  Lab 06/22/23 1756  GLUCAP 100*    Review of Systems:   Bolds are positive  Constitutional: weight loss, gain, night sweats, Fevers, chills, fatigue .  HEENT: headaches, Sore throat, sneezing, nasal congestion, post nasal drip, Difficulty swallowing, Tooth/dental problems, visual complaints visual changes, ear ache CV:  chest pain, radiates:,Orthopnea, PND, swelling in lower extremities, dizziness, palpitations, syncope.  GI  heartburn, indigestion, abdominal pain, nausea, vomiting, diarrhea, change in bowel habits, loss of  appetite, bloody stools.  Resp: cough, productive: , hemoptysis, dyspnea, chest pain, pleuritic.  Skin: rash or itching or icterus GU: dysuria, change in color of urine, urgency or frequency. flank pain, hematuria  MS: joint pain or swelling. decreased range of motion  Psych: change in mood or affect. depression or anxiety.  Neuro: difficulty with speech, weakness, numbness, ataxia    Past Medical History:  She,  has a past medical history of Best dystrophy (2012), Chest pain, atypical, Heart murmur, HYPERLIPIDEMIA, HYPOTHYROIDISM, POST-RADIOACTIVE IODINE, OSTEOPENIA, Sleep apnea, TOBACCO USE, UNSPECIFIED SUBJECTIVE VISUAL DISTURBANCE, VITAMIN D DEFICIENCY, and WART, RIGHT HAND.   Surgical History:   Past Surgical History:  Procedure Laterality Date   ABDOMINAL HYSTERECTOMY     CARPAL TUNNEL RELEASE     EXTRACORPOREAL SHOCK WAVE LITHOTRIPSY Right 10/15/2020   Procedure: EXTRACORPOREAL SHOCK WAVE LITHOTRIPSY (ESWL);  Surgeon: Alfredo Martinez, MD;  Location: [facility];  Service: Urology;  Laterality: Right;   EXTRACORPOREAL SHOCK WAVE LITHOTRIPSY Right 11/19/2020   Procedure: EXTRACORPOREAL SHOCK WAVE LITHOTRIPSY (  ESWL);  Surgeon: Jerilee Field, MD;  Location: [facility];  Service: Urology;  Laterality: Right;   EYE SURGERY     LAPAROSCOPIC OOPHERECTOMY     LITHOTRIPSY     TONSILLECTOMY       Social History:   reports that she quit smoking about 12 years ago. Her smoking use included cigarettes. She started smoking about 62 years ago. She has a 50 pack-year smoking history. She has never used smokeless tobacco. She reports current alcohol use. She reports that she does not use drugs.   Family History:  Her family history includes Cancer in her brother; Colon cancer in her paternal grandmother; Prostate cancer in her brother; Skin cancer in her sister; Thyroid cancer in her brother.   Allergies No Known Allergies   Home Medications  Prior to  Admission medications   Medication Sig Start Date End Date Taking? Authorizing Provider  albuterol (VENTOLIN HFA) 108 (90 Base) MCG/ACT inhaler INHALE 2 PUFFS INTO THE LUNGS EVERY 6 HOURS AS NEEDED FOR WHEEZING OR SHORTNESS OF BREATH Patient taking differently: Inhale 2 puffs into the lungs every 6 (six) hours as needed for wheezing or shortness of breath. 05/10/21   Copland, Gwenlyn Found, MD  diazepam (VALIUM) 5 MG tablet TAKE 1/2 TO 1 TABLET BY  MOUTH DAILY AS NEEDED FOR  ANXIETY Patient taking differently: Take 2.5-5 mg by mouth daily as needed for anxiety. TAKE 1/2 TO 1 TABLET BY  MOUTH DAILY AS NEEDED FOR  ANXIETY 09/18/19   Copland, Gwenlyn Found, MD  ergocalciferol (VITAMIN D2) 50000 units capsule Take 1 capsule (50,000 Units total) by mouth once a week. Reported on 08/05/2015 Patient not taking: Reported on 10/25/2021 09/25/16   Copland, Gwenlyn Found, MD  lamoTRIgine (LAMICTAL) 150 MG tablet TAKE 1 TABLET BY MOUTH  DAILY Patient taking differently: Take 150 mg by mouth daily. 06/30/20   Bradd Canary, MD  levothyroxine (SYNTHROID) 125 MCG tablet Take 1 tablet (125 mcg total) by mouth daily before breakfast. Needs appointment with PCP and lab work 12/16/22   Copland, Gwenlyn Found, MD  lovastatin (MEVACOR) 20 MG tablet Take 1 tablet (20 mg total) by mouth at bedtime. NEEDS OFFICE VISIT FOR ADDITIONAL REFILLS 04/19/22   Copland, Gwenlyn Found, MD  meloxicam (MOBIC) 7.5 MG tablet Take 7.5 mg by mouth daily. For joint pain 12/20/17   [provider]  Omega-3 Fatty Acids (FISH OIL) 500 MG CAPS Take 500 mg by mouth daily.    [provider]  ondansetron (ZOFRAN ODT) 4 MG disintegrating tablet Take 1 tablet (4 mg total) by mouth every 8 (eight) hours as needed for nausea or vomiting. 10/05/20   Wynetta Fines, MD  oxyCODONE-acetaminophen (PERCOCET/ROXICET) 5-325 MG tablet Take 1 tablet by mouth every 6 (six) hours as needed for severe pain. Patient not taking: Reported on 06/23/2023 10/05/20   Wynetta Fines, MD  oxyCODONE-acetaminophen (PERCOCET/ROXICET) 5-325 MG tablet Take 1 tablet by mouth every 6 (six) hours as needed for severe pain. Patient not taking: Reported on 10/25/2021 10/05/20   Wynetta Fines, MD  sertraline (ZOLOFT) 100 MG tablet TAKE 1 AND 1/2 TABLETS BY  MOUTH DAILY Patient taking differently: Take 150 mg by mouth daily. 06/30/20   Bradd Canary, MD     Critical care time:      Joneen Roach, AGACNP-BC [facility]  See Amion for personal pager PCCM on call pager [phone] until 7pm. Please call Elink 7p-7a. [phone]  06/23/2023 12:24 PM

## 2023-06-23 NOTE — Progress Notes (Signed)
IR is aware of this patient. After review by Dr. Odis Luster, IR will not proceed with a lung biopsy at this time. MRI is recommended to assist with further workup prior to biopsy consideration.

## 2023-06-23 NOTE — Assessment & Plan Note (Signed)
-  -   Will initiate: Steroid taper  -  Antibiotics Unasyn given possible aspiration - Albuterol  PRN, - scheduled duoneb,  -  Breo or Dulera at discharge   -  Mucinex.  Titrate O2 to saturation >90%. Follow patients respiratory status.  Order nfluenza PCR   VBG non acute    Currently mentating well no evidence of symptomatic hypercarbia

## 2023-06-23 NOTE — ED Notes (Signed)
Patient transported to MRI 

## 2023-06-23 NOTE — Care Management Obs Status (Signed)
MEDICARE OBSERVATION STATUS NOTIFICATION   Patient Details  Name: Yolanda Reeves MRN: [id number] Date of Birth: [birth year]   Medicare Observation Status Notification Given:  Yes    Princella Ion, LCSW 06/23/2023, 12:10 PM

## 2023-06-23 NOTE — Evaluation (Signed)
Occupational Therapy Evaluation Patient Details Name: Yolanda Reeves MRN: [id number] DOB: 02-Mar-1946 Today's Date: 06/23/2023   History of Present Illness Yolanda Reeves is a 78 yo female admitted with unintentional benzodiazepine overdose. PMH: hyperlipidemia, hypothyroidism, chronic pain, HOH, legally blind, and anxiety.   Clinical Impression   Pt admitted with the above diagnosis. Pt currently with functional limitations due to the deficits listed below (see OT Problem List). Prior to admit, pt reports that she lives alone and is mod I with BADL tasks and functional transfers.  Pt will benefit from acute skilled OT to increase their safety and independence with ADL and functional mobility for ADL to facilitate discharge. OT will continue to follow pt acutely.         If plan is discharge home, recommend the following: A little help with walking and/or transfers;A little help with bathing/dressing/bathroom;Assistance with cooking/housework    Functional Status Assessment  Patient has had a recent decline in their functional status and demonstrates the ability to make significant improvements in function in a reasonable and predictable amount of time.  Equipment Recommendations  Tub/shower seat       Precautions / Restrictions Precautions Precautions: Fall Precaution Comments: legally blind; very HOH Restrictions Weight Bearing Restrictions Per Provider Order: No      Mobility Bed Mobility Overal bed mobility: Needs Assistance Bed Mobility: Supine to Sit     Supine to sit: Contact guard     General bed mobility comments: slow to mobilize, therapists managing lines for safety    Transfers Overall transfer level: Needs assistance Equipment used: 1 person hand held assist Transfers: Sit to/from Stand, Bed to chair/wheelchair/BSC Sit to Stand: Contact guard assist, +2 safety/equipment     Step pivot transfers: Contact guard assist, +2 safety/equipment     General  transfer comment: guiding cues for hand placeent and w/c at bedside for step pivot transfer, pt reports R knee buckling at times but not noted this encounter      Balance Overall balance assessment: Needs assistance, History of Falls Sitting-balance support: Feet unsupported, No upper extremity supported Sitting balance-Leahy Scale: Good     Standing balance support: During functional activity, Single extremity supported Standing balance-Leahy Scale: Fair        ADL either performed or assessed with clinical judgement   ADL      General ADL Comments: Able to complete BADL tasks with Min A due to visual impairment     Vision Baseline Vision/History: 2 Legally blind Ability to See in Adequate Light: 2 Moderately impaired Patient Visual Report: No change from baseline Vision Assessment?: No apparent visual deficits Additional Comments: Able to see moving shapes     Perception Perception: Impaired Preception Impairment Details:  (due to visual deficits)     Praxis Praxis: Impaired Praxis Impairment Details:  (due to visual deficits)     Pertinent Vitals/Pain Pain Assessment Pain Assessment: No/denies pain     Extremity/Trunk Assessment Upper Extremity Assessment Upper Extremity Assessment: Generalized weakness;Right hand dominant   Lower Extremity Assessment Lower Extremity Assessment: Defer to PT evaluation   Cervical / Trunk Assessment Cervical / Trunk Assessment: Normal   Communication Communication Communication: Hearing impairment Cueing Techniques: Tactile cues;Verbal cues   Cognition Arousal: Alert Behavior During Therapy: Flat affect Overall Cognitive Status: Within Functional Limits for tasks assessed    General Comments: pt oriented to self, hospital, DOB, follows commands appropriately     General Comments  VSS on RA  Home Living Family/patient expects to be discharged to:: Private residence Living Arrangements: Alone Available  Help at Discharge: Family;Available PRN/intermittently Type of Home: House Home Access: Level entry     Home Layout: One level     Bathroom Shower/Tub: Tub/shower unit;Walk-in shower (door on walk-in shower is broken)         Home Equipment: Other (comment);Lift chair (white cane)   Additional Comments: sleeps in recliner/lift chair      Prior Functioning/Environment Prior Level of Function : Independent/Modified Independent;History of Falls (last six months)      Mobility Comments: does not drive. Has two dogs. 5 falls in the past 6 months ADLs Comments: Pt completes her own meals - reports microwave meals are the best. Family will also bring meals.        OT Problem List: Decreased strength;Decreased activity tolerance;Impaired balance (sitting and/or standing)      OT Treatment/Interventions: Therapeutic exercise;Self-care/ADL training;Energy conservation;DME and/or AE instruction;Therapeutic activities;Patient/family education;Balance training    OT Goals(Current goals can be found in the care plan section) Acute Rehab OT Goals Patient Stated Goal: to eat lunch OT Goal Formulation: Patient unable to participate in goal setting Time For Goal Achievement: 07/07/23 Potential to Achieve Goals: Good  OT Frequency: Min 1X/week    Co-evaluation PT/OT/SLP Co-Evaluation/Treatment: Yes Reason for Co-Treatment: For patient/therapist safety;To address functional/ADL transfers PT goals addressed during session: Mobility/safety with mobility;Balance OT goals addressed during session: Strengthening/ROM;ADL's and self-care      AM-PAC OT "6 Clicks" Daily Activity     Outcome Measure Help from another person eating meals?: None Help from another person taking care of personal grooming?: None Help from another person toileting, which includes using toliet, bedpan, or urinal?: A Little Help from another person bathing (including washing, rinsing, drying)?: A Little Help from  another person to put on and taking off regular upper body clothing?: A Little Help from another person to put on and taking off regular lower body clothing?: A Little 6 Click Score: 20   End of Session    Activity Tolerance: Patient tolerated treatment well Patient left: Other (comment) (With RN in transport chair to go upstairs to unit)  OT Visit Diagnosis: Unsteadiness on feet (R26.81);Muscle weakness (generalized) (M62.81);History of falling (Z91.81)                Time: 4540-9811 OT Time Calculation (min): 17 min Charges:  OT General Charges $OT Visit: 1 Visit OT Evaluation $OT Eval Moderate Complexity: 1 Mod  [employer], OTR/L,CBIS  Supplemental OT - [facility] and [facility] Secure Chat Preferred    Ezio Wieck, Charisse March 06/23/2023, 2:20 PM

## 2023-06-24 DIAGNOSIS — F332 Major depressive disorder, recurrent severe without psychotic features: Secondary | ICD-10-CM

## 2023-06-24 DIAGNOSIS — T424X1A Poisoning by benzodiazepines, accidental (unintentional), initial encounter: Secondary | ICD-10-CM | POA: Diagnosis not present

## 2023-06-24 DIAGNOSIS — R918 Other nonspecific abnormal finding of lung field: Secondary | ICD-10-CM | POA: Diagnosis not present

## 2023-06-24 DIAGNOSIS — J690 Pneumonitis due to inhalation of food and vomit: Secondary | ICD-10-CM | POA: Diagnosis not present

## 2023-06-24 LAB — CBC
HCT: 35.3 % — ABNORMAL LOW (ref 36.0–46.0)
Hemoglobin: 11.2 g/dL — ABNORMAL LOW (ref 12.0–15.0)
MCH: 29.9 pg (ref 26.0–34.0)
MCHC: 31.7 g/dL (ref 30.0–36.0)
MCV: 94.4 fL (ref 80.0–100.0)
Platelets: 194 10*3/uL (ref 150–400)
RBC: 3.74 MIL/uL — ABNORMAL LOW (ref 3.87–5.11)
RDW: 15.6 % — ABNORMAL HIGH (ref 11.5–15.5)
WBC: 7.5 10*3/uL (ref 4.0–10.5)
nRBC: 0 % (ref 0.0–0.2)

## 2023-06-24 LAB — BASIC METABOLIC PANEL
Anion gap: 9 (ref 5–15)
BUN: 16 mg/dL (ref 8–23)
CO2: 23 mmol/L (ref 22–32)
Calcium: 8.6 mg/dL — ABNORMAL LOW (ref 8.9–10.3)
Chloride: 105 mmol/L (ref 98–111)
Creatinine, Ser: 0.81 mg/dL (ref 0.44–1.00)
GFR, Estimated: >60 mL/min (ref >60)
Glucose, Bld: 120 mg/dL — ABNORMAL HIGH (ref 70–99)
Potassium: 3.7 mmol/L (ref 3.5–5.1)
Sodium: 137 mmol/L (ref 135–145)

## 2023-06-24 LAB — EXPECTORATED SPUTUM ASSESSMENT W GRAM STAIN, RFLX TO RESP C

## 2023-06-24 LAB — T3: T3, Total: 21 ng/dL — ABNORMAL LOW (ref 71–180)

## 2023-06-24 MED ORDER — LAMOTRIGINE 25 MG PO TABS
150.0000 mg | ORAL_TABLET | Freq: Every day | ORAL | Status: DC
  Administered 2023-06-25 – 2023-07-08 (×14): 150 mg via ORAL
  Filled 2023-06-24 (×14): qty 2

## 2023-06-24 MED ORDER — BUSPIRONE HCL 5 MG PO TABS
5.0000 mg | ORAL_TABLET | Freq: Three times a day (TID) | ORAL | Status: DC
  Administered 2023-06-24 – 2023-06-28 (×11): 5 mg via ORAL
  Filled 2023-06-24 (×11): qty 1

## 2023-06-24 MED ORDER — POTASSIUM CHLORIDE CRYS ER 20 MEQ PO TBCR
40.0000 meq | EXTENDED_RELEASE_TABLET | Freq: Once | ORAL | Status: AC
  Administered 2023-06-24: 40 meq via ORAL
  Filled 2023-06-24: qty 2

## 2023-06-24 NOTE — Progress Notes (Signed)
[facility] PROGRESS NOTE   Yolanda Reeves NGE:[id number] DOB: 1945-12-28 DOA: 06/22/2023  PCP: Pearline Cables, MD  Brief History: 78 y.o. female with medical history significant of chronic pain, hyperlipidemia hypothyroidism tobacco abuse osteopenia, and anxiety depression, kidney stone who was brought in by EMS after she was found to be unresponsive by family member.  She was apparently vomiting as well.  She had taken 8 to 10 tablets of Valium because she could not sleep.  She denied suicidal intent.  Workup in the emergency department raised concern for aspiration pneumonia and also revealed masses in the lung.  She was hospitalized for further management.  Consultants: Pulmonology.  Interventional radiology  Procedures: Echocardiogram    Subjective/Interval History: Patient feels well.  Occasional shortness of breath and occasional cough is present.  No chest pain.  No nausea or vomiting.     Assessment/Plan:  Aspiration pneumonia with concern for acute bronchitis/history of COPD Likely as a result of the unintentional overdose. Seen by speech therapy.  No needs identified. Continue with the Unasyn for now.  Also noted to be on Solu-Medrol.  Changed to oral steroids from tomorrow.   Nebulizer treatment as needed.  Not requiring any oxygen currently.  Unintentional benzodiazepine overdose She took 8 to 10 tablets of Valium in order to go to sleep.  Denied any suicidal ideation.  Holding benzodiazepines for now.  She is now fully awake and alert. Not certain how she acquired the Valium.  No sedative or narcotic prescription data is noted on the prescriber database.  She has not been prescribed any of these medications recently, at least locally. Psychiatry consult has been placed by pulmonology.  Lung mass/liver lesions concerning for metastatic process She underwent a CT angiogram which was negative for PE but showed a large left apical lung mass measuring up to 6.2  cm.  Additional spiculated solid mass in the right upper lobe measuring 2.4 cm.  Pulmonary nodules on the left side were also noted.  Enlarged left hilar and AP window lymph nodes were also noted.  She is a smoker.  Used to smoke cigarettes and now she vapes. CT of the abdomen pelvis raises concern for metastatic process in the liver and elsewhere.  Pulmonology suggested biopsying one of the liver lesions.  IR wants to better characterize these lesions before attempting biopsy so MRI of the abdomen has been ordered.  Hypothyroidism Supposed to be on 125 mcg of Synthroid on a daily basis. Her TSH was noted to be 66.5.  Free T4 noted to be 0.34.  Compliance is questionable.  Dose of levothyroxine was increased.   Thyroid function test will need to be rechecked in a few weeks.  Hyperlipidemia Continue statin.  QT prolongation Monitor on telemetry.  Avoid QT prolonging medications.  Atypical chest pain Likely due to lung mass.  No ischemic changes on EKG.   Echocardiogram shows normal LVEF with grade 1 diastolic dysfunction.  No wall motion abnormalities.  No significant valvular disease noted. No further workup at this time.  Symptoms could be due to her lung mass.  Normocytic anemia Dilutional drop in hemoglobin noted.  Stable this morning  Class III obesity Estimated body mass index is 37.48 kg/m as calculated from the following:   Height as of this encounter: 5\' 2"  (1.575 m).   Weight as of this encounter: 92.9 kg.   DVT Prophylaxis: SCDs only for now in case of procedure needs to be performed Code Status: Full code Family  Communication: Discussed with the patient Disposition Plan: To be determined     Medications: Scheduled:  guaiFENesin  600 mg Oral BID   levothyroxine  150 mcg Oral QAC breakfast   methylPREDNISolone (SOLU-MEDROL) injection  40 mg Intravenous Q12H   pravastatin  40 mg Oral q1800   Continuous:  ampicillin-sulbactam (UNASYN) IV 3 g (06/24/23 0615)    [id number] **OR** acetaminophen, albuterol, ibuprofen  Antibiotics: Anti-infectives (From admission, onward)    Start     Dose/Rate Route Frequency Ordered Stop   06/23/23 1200  Ampicillin-Sulbactam (UNASYN) 3 g in sodium chloride 0.9 % 100 mL IVPB        3 g 200 mL/hr over 30 Minutes Intravenous Every 6 hours 06/23/23 1045     06/23/23 0500  Ampicillin-Sulbactam (UNASYN) 3 g in sodium chloride 0.9 % 100 mL IVPB  Status:  Discontinued        3 g 200 mL/hr over 30 Minutes Intravenous Every 8 hours 06/22/23 2308 06/23/23 1045   06/22/23 2045  Ampicillin-Sulbactam (UNASYN) 3 g in sodium chloride 0.9 % 100 mL IVPB        3 g 200 mL/hr over 30 Minutes Intravenous  Once 06/22/23 2042 06/22/23 2147       Objective:  Vital Signs  Vitals:   06/23/23 1307 06/23/23 1827 06/23/23 2204 06/24/23 0250  BP: (!) 154/83 132/81 116/88 (!) 143/85  Pulse: (!) 54 61 79 67  Resp: 17 17 16 19   Temp: 97.7 F (36.5 C) 97.8 F (36.6 C) 97.9 F (36.6 C) 98.3 F (36.8 C)  TempSrc: Oral Oral Oral Oral  SpO2: 100% 93% 94% 94%  Weight:  92.9 kg    Height:  5\' 2"  (1.575 m)      Intake/Output Summary (Last 24 hours) at 06/24/2023 1005 Last data filed at 06/23/2023 1855 Gross per 24 hour  Intake 1015.42 ml  Output --  Net 1015.42 ml   Filed Weights   06/22/23 1752 06/23/23 1827  Weight: 98.4 kg 92.9 kg    General appearance: Awake alert.  In no distress Resp: Coarse breath sounds with few crackles at the bases.  Occasional scattered wheezing.  No rhonchi. Cardio: S1-S2 is normal regular.  No S3-S4.  No rubs murmurs or bruit GI: Abdomen is soft.  Nontender nondistended.  Bowel sounds are present normal.  No masses organomegaly Extremities: No edema.  Some physical deconditioning is noted Neurologic: Alert and oriented x3.  No focal neurological deficits.     Lab Results:  Data Reviewed: I have personally reviewed following labs and reports of the imaging studies  CBC: Recent  Labs  Lab 06/22/23 1900 06/23/23 0443 06/24/23 0430  WBC 5.4 5.8 7.5  NEUTROABS 3.3  --   --   HGB 12.0 11.9* 11.2*  HCT 39.2 38.1 35.3*  MCV 95.6 96.7 94.4  PLT 181 170 194    Basic Metabolic Panel: Recent Labs  Lab 06/22/23 1900 06/23/23 0443 06/24/23 0430  NA 141 141 137  K 3.6 3.8 3.7  CL 107 108 105  CO2 26 24 23   GLUCOSE 90 106* 120*  BUN 16 13 16   CREATININE 1.09* 0.94 0.81  CALCIUM 8.7* 8.7* 8.6*  MG 2.5* 2.3  --   PHOS 3.8 3.6  --     GFR: Estimated Creatinine Clearance: 62.7 mL/min (by C-G formula based on SCr of 0.81 mg/dL).  Liver Function Tests: Recent Labs  Lab 06/22/23 1900 06/23/23 0443  AST 14* 12*  ALT 9 8  ALKPHOS 58 61  BILITOT 0.8 0.4  PROT 6.7 6.8  ALBUMIN 3.2* 3.4*    Coagulation Profile: Recent Labs  Lab 06/22/23 2343  INR 1.0    Cardiac Enzymes: Recent Labs  Lab 06/22/23 1900  CKTOTAL 54    CBG: Recent Labs  Lab 06/22/23 1756  GLUCAP 100*    Thyroid Function Tests: Recent Labs    06/22/23 1900 06/22/23 2343  TSH 66.588*  --   FREET4  --  0.34*     Recent Results (from the past 240 hours)  Culture, blood (routine x 2) Call MD if unable to obtain prior to antibiotics being given     Status: None (Preliminary result)   Collection Time: 06/22/23 11:00 PM   Specimen: BLOOD RIGHT HAND  Result Value Ref Range Status   Specimen Description   Final    BLOOD RIGHT HAND Performed at [facility], [address], [place], [place] [address]    Special Requests   Final    BOTTLES DRAWN AEROBIC AND ANAEROBIC Blood Culture results may not be optimal due to an inadequate volume of blood received in culture bottles Performed at [facility], [address] [place], [place] [address]    Culture   Final    NO GROWTH 1 DAY Performed at [facility], [address] [place], [place] [address]    Report Status PENDING  Incomplete  Culture, blood (routine x 2) Call MD if  unable to obtain prior to antibiotics being given     Status: None (Preliminary result)   Collection Time: 06/22/23 11:15 PM   Specimen: BLOOD LEFT HAND  Result Value Ref Range Status   Specimen Description   Final    BLOOD LEFT HAND Performed at [facility], [address] [place], [place] [address]    Special Requests   Final    BOTTLES DRAWN AEROBIC AND ANAEROBIC Blood Culture results may not be optimal due to an inadequate volume of blood received in culture bottles Performed at [facility], [address] [place], [place] [address]    Culture   Final    NO GROWTH 1 DAY Performed at [facility], [address], [place], [place] [address]    Report Status PENDING  Incomplete  Expectorated Sputum Assessment w Gram Stain, Rflx to Resp Cult     Status: None   Collection Time: 06/23/23  8:05 AM   Specimen: Expectorated Sputum  Result Value Ref Range Status   Specimen Description EXPECTORATED SPUTUM  Final   Special Requests NONE  Final   Sputum evaluation   Final    THIS SPECIMEN IS ACCEPTABLE FOR SPUTUM CULTURE Performed at [facility], [address], [place], [place] [address]    Report Status 06/24/2023 FINAL  Final  Culture, Respiratory w Gram Stain     Status: None (Preliminary result)   Collection Time: 06/23/23  8:05 AM  Result Value Ref Range Status   Specimen Description   Final    EXPECTORATED SPUTUM Performed at [facility], [address] [place], [place] [address]    Special Requests   Final    NONE Reflexed from [phone] Performed at [facility], [address] [place], [place] [address]    Gram Stain   Final    NO WBC SEEN RARE GRAM POSITIVE COCCI IN CHAINS RARE GRAM POSITIVE RODS Performed at [facility],  [address], [place], [place] [address]    Culture PENDING  Incomplete   Report Status PENDING  Incomplete      Radiology  Studies: MR BRAIN W WO CONTRAST Result Date: 06/23/2023 CLINICAL DATA:  Metastatic disease evaluation.  Lung cancer. EXAM: MRI HEAD WITHOUT AND WITH CONTRAST TECHNIQUE: Multiplanar, multiecho pulse sequences of the brain and surrounding structures were obtained without and with intravenous contrast. CONTRAST:  10mL GADAVIST GADOBUTROL 1 MMOL/ML IV SOLN COMPARISON:  01/06/2009 FINDINGS: Brain: Diffusion imaging does not show any acute or subacute infarction or other cause of restricted diffusion. Brain shows atrophy with extensive chronic small-vessel ischemic changes affecting the pons and cerebral hemispheric white matter. No large vessel territory stroke. No evidence of mass lesion, hemorrhage, hydrocephalus or extra-axial collection. After contrast administration, no abnormal brain or leptomeningeal enhancement occurs. Vascular: Major vessels at the base of the brain show flow. Skull and upper cervical spine: Negative Sinuses/Orbits: Clear/normal Other: None IMPRESSION: No evidence of metastatic disease. Atrophy with extensive chronic small-vessel ischemic changes of the pons and cerebral hemispheric white matter. Electronically Signed   By: Paulina Fusi M.D.   On: 06/23/2023 14:33   CT ABDOMEN PELVIS W CONTRAST Result Date: 06/23/2023 CLINICAL DATA:  Staging lung cancer.  * Tracking Code: BO * EXAM: CT ABDOMEN AND PELVIS WITH CONTRAST TECHNIQUE: Multidetector CT imaging of the abdomen and pelvis was performed using the standard protocol following bolus administration of intravenous contrast. RADIATION DOSE REDUCTION: This exam was performed according to the departmental dose-optimization program which includes automated exposure control, adjustment of the mA and/or kV according to patient size and/or use of iterative reconstruction technique. CONTRAST:  OMNIPAQUE IOHEXOL 300 MG/ML  SOLN COMPARISON:  Abdomen pelvis CT 10/05/2020. CT angiogram chest 06/22/2023 FINDINGS: Lower chest: Please correlate  with CT of the chest from 06/22/2023. Again left lower lobe lung nodule seen on series 6, image 8. Pericardial effusion. Hepatobiliary: Multiple liver masses identified worrisome for metastatic disease. At least 7 lesions are identified. These small 1 of the larger foci seen anteriorly in segment 2 measuring 12 by 10 mm on series 2, image 12. Patent portal vein. Gallbladder is nondilated. Pancreas: Unremarkable. No pancreatic ductal dilatation or surrounding inflammatory changes. Spleen: T1 Adrenals/Urinary Tract: Right adrenal gland is slightly nodular. On series 2, image 25 this measures 13 by 11 mm. There is a left adrenal nodule as well measuring 16 by 10 mm. Indeterminate. There is contrast in the renal collecting systems related to the prior exam. Mild bilateral renal atrophy. There is a lower pole lesion on the right measuring on series 2, image 42 2.1 by 1.6 cm. This has Hounsfield units of 51 on portal venous phase in on delayed forty-three. This has with a lobular margins on coronal imaging and is suspicious. There are some other lesions in each kidney which are also indeterminate based on appearance today. Not clearly cysts. Ureters have normal course and caliber down to the bladder. Preserved contours of the urinary bladder. Stomach/Bowel: Large bowel has scattered stool and diverticula. There are redundant course of the colon diffusely. The cecum actually resides in the anterior left midabdomen. Normal appendix extends caudal to the cecum. Small bowel is nondilated with several small bowel loops extending to the right side lateral to the course of the ascending colon. Stomach is nondilated. Small hiatal hernia. Vascular/Lymphatic: Aortic atherosclerosis. No enlarged abdominal or pelvic lymph nodes. Reproductive: Prostate is unremarkable. Other: No free air or free fluid. Musculoskeletal: Scattered degenerative changes of  the spine and pelvis. Transitional lumbosacral segment. IMPRESSION: Multiple  aggressive appearing liver lesions worrisome for metastatic disease. Renal lesions are also seen which are indeterminate. One lesion in particular along the lower pole of the right kidney has a aggressive features and could be primary lesion versus metastatic focus. Bilateral adrenal nodules are noted which are indeterminate. Overall with although these areas MRI may be of some benefit with and without contrast dynamic to further delineate the etiology of the structures. No bowel obstruction. Colonic diverticula. Atypical course of the colon with the cecum in the anterior left midabdomen. Please correlate with previous CT angiogram of the chest. Electronically Signed   By: Karen Kays M.D.   On: 06/23/2023 11:43   ECHOCARDIOGRAM COMPLETE Result Date: 06/23/2023    ECHOCARDIOGRAM REPORT   Patient Name:   Yolanda Reeves Date of Exam: 06/23/2023 Medical Rec #:  [id number]       Height:       61.0 in Accession #:    [id number]      Weight:       216.9 lb Date of Birth:  [birth year]       BSA:          1.955 m Patient Age:    77 years        BP:           149/71 mmHg Patient Gender: F               HR:           57 bpm. Exam Location:  Inpatient Procedure: 2D Echo, Cardiac Doppler and Color Doppler Indications:    R94.31 Abnormal EKG  History:        Patient has no prior history of Echocardiogram examinations.                 COPD and HLD; Signs/Symptoms:Chest Pain.  Sonographer:    Webb Laws Referring Phys: [id number] ANASTASSIA DOUTOVA IMPRESSIONS  1. Left ventricular ejection fraction, by estimation, is 60 to 65%. The left ventricle has normal function. The left ventricle has no regional wall motion abnormalities. Left ventricular diastolic parameters are consistent with Grade I diastolic dysfunction (impaired relaxation). Elevated left ventricular end-diastolic pressure.  2. Right ventricular systolic function is normal. The right ventricular size is normal.  3. The mitral valve is abnormal. No evidence of mitral  valve regurgitation. No evidence of mitral stenosis. Moderate mitral annular calcification.  4. The aortic valve is tricuspid. There is mild calcification of the aortic valve. There is mild thickening of the aortic valve. Aortic valve regurgitation is not visualized. Aortic valve sclerosis is present, with no evidence of aortic valve stenosis.  5. The inferior vena cava is dilated in size with >50% respiratory variability, suggesting right atrial pressure of 8 mmHg. FINDINGS  Left Ventricle: Left ventricular ejection fraction, by estimation, is 60 to 65%. The left ventricle has normal function. The left ventricle has no regional wall motion abnormalities. The left ventricular internal cavity size was normal in size. There is  no left ventricular hypertrophy. Left ventricular diastolic parameters are consistent with Grade I diastolic dysfunction (impaired relaxation). Elevated left ventricular end-diastolic pressure. Right Ventricle: The right ventricular size is normal. No increase in right ventricular wall thickness. Right ventricular systolic function is normal. Left Atrium: Left atrial size was normal in size. Right Atrium: Right atrial size was normal in size. Pericardium: There is no evidence of pericardial effusion. Mitral Valve: The mitral valve is abnormal.  There is mild thickening of the mitral valve leaflet(s). There is mild calcification of the mitral valve leaflet(s). Moderate mitral annular calcification. No evidence of mitral valve regurgitation. No evidence  of mitral valve stenosis. Tricuspid Valve: The tricuspid valve is normal in structure. Tricuspid valve regurgitation is not demonstrated. No evidence of tricuspid stenosis. Aortic Valve: The aortic valve is tricuspid. There is mild calcification of the aortic valve. There is mild thickening of the aortic valve. Aortic valve regurgitation is not visualized. Aortic valve sclerosis is present, with no evidence of aortic valve stenosis. Pulmonic  Valve: The pulmonic valve was normal in structure. Pulmonic valve regurgitation is not visualized. No evidence of pulmonic stenosis. Aorta: The aortic root is normal in size and structure. Venous: The inferior vena cava is dilated in size with greater than 50% respiratory variability, suggesting right atrial pressure of 8 mmHg. IAS/Shunts: No atrial level shunt detected by color flow Doppler.  LEFT VENTRICLE PLAX 2D LVIDd:         4.20 cm     Diastology LVIDs:         2.90 cm     LV e' medial:    3.26 cm/s LV PW:         1.10 cm     LV E/e' medial:  18.7 LV IVS:        0.90 cm     LV e' lateral:   4.35 cm/s LVOT diam:     1.90 cm     LV E/e' lateral: 14.0 LV SV:         55 LV SV Index:   28 LVOT Area:     2.84 cm  LV Volumes (MOD) LV vol d, MOD A2C: 35.3 ml LV vol d, MOD A4C: 34.1 ml LV vol s, MOD A2C: 8.3 ml LV vol s, MOD A4C: 10.4 ml LV SV MOD A2C:     27.0 ml LV SV MOD A4C:     34.1 ml LV SV MOD BP:      25.7 ml RIGHT VENTRICLE             IVC RV Basal diam:  3.90 cm     IVC diam: 2.30 cm RV S prime:     10.10 cm/s TAPSE (M-mode): 2.5 cm LEFT ATRIUM             Index        RIGHT ATRIUM           Index LA diam:        2.80 cm 1.43 cm/m   RA Area:     13.90 cm LA Vol (A2C):   18.1 ml 9.26 ml/m   RA Volume:   33.10 ml  16.93 ml/m LA Vol (A4C):   20.2 ml 10.33 ml/m LA Biplane Vol: 20.0 ml 10.23 ml/m  AORTIC VALVE LVOT Vmax:   90.90 cm/s LVOT Vmean:  58.600 cm/s LVOT VTI:    0.195 m  AORTA Ao Root diam: 3.10 cm Ao Asc diam:  3.40 cm MITRAL VALVE               TRICUSPID VALVE MV Area (PHT): 2.69 cm    TR Peak grad:   13.1 mmHg MV Decel Time: 282 msec    TR Vmax:        181.00 cm/s MV E velocity: 60.80 cm/s MV A velocity: 97.30 cm/s  SHUNTS MV E/A ratio:  0.62        Systemic VTI:  0.20  m                            Systemic Diam: 1.90 cm Charlton Haws MD Electronically signed by Charlton Haws MD Signature Date/Time: 06/23/2023/9:51:44 AM    Final    CT Angio Chest Pulmonary Embolism (PE) W or WO Contrast Result  Date: 06/22/2023 CLINICAL DATA:  Unresponsive vomiting EXAM: CT ANGIOGRAPHY CHEST WITH CONTRAST TECHNIQUE: Multidetector CT imaging of the chest was performed using the standard protocol during bolus administration of intravenous contrast. Multiplanar CT image reconstructions and MIPs were obtained to evaluate the vascular anatomy. RADIATION DOSE REDUCTION: This exam was performed according to the departmental dose-optimization program which includes automated exposure control, adjustment of the mA and/or kV according to patient size and/or use of iterative reconstruction technique. CONTRAST:  75mL OMNIPAQUE IOHEXOL 350 MG/ML SOLN COMPARISON:  06/22/2023 chest x-ray FINDINGS: Cardiovascular: Satisfactory opacification of the pulmonary arteries to the segmental level. No evidence of pulmonary embolism. Moderate aortic atherosclerosis. No aneurysm. Normal cardiac size. Small pericardial effusion. Coronary vascular calcification. Mediastinum/Nodes: Patent trachea. No suspicious thyroid mass. Enlarged AP window lymph nodes measuring up to 17 mm on series 4, image 39. Multiple enlarged left hilar nodes measuring up to 17 mm. Esophagus within normal limits. Small hiatal hernia Lungs/Pleura: No pleural effusion or pneumothorax. Large left apical lung mass measuring approximately 6.2 by 4.6 by 4.5 cm on series 4, image 22 and series 6, image 102. Additional spiculated right upper lobe solid mass measuring 2.4 by 1.7 by 1.6 cm on series 12, image 32 and coronal series 6, image 92. Additional left lower lobe pulmonary nodule measuring 6 mm on series 12, image 60 and 3 mm left upper lobe pulmonary nodule on series 12, image 43. Scattered hazy pulmonary densities could be due to atelectasis or small airways disease. Upper Abdomen: No acute finding Musculoskeletal: No acute or suspicious osseous abnormality. Review of the MIP images confirms the above findings. IMPRESSION: 1. Negative for acute pulmonary embolus. 2. Large left  apical lung mass measuring up to 6.2 cm, concerning for lung carcinoma. Additional spiculated solid mass in the right upper lobe measuring 2.4 cm which may represent metastatic lesion or synchronous lung CA. Additional small left-sided predominant pulmonary nodules measuring up to 6 mm in the left lower lobe are indeterminate for metastatic disease. 3. Enlarged left hilar and AP window lymph nodes, suspicious for metastatic disease. 4. Aortic atherosclerosis. Aortic Atherosclerosis (ICD10-I70.0). Electronically Signed   By: Jasmine Pang M.D.   On: 06/22/2023 23:48   DG Chest Port 1 View Result Date: 06/22/2023 CLINICAL DATA:  Unresponsive with vomiting. EXAM: PORTABLE CHEST 1 VIEW COMPARISON:  None Available. FINDINGS: The heart size and mediastinal contours are within normal limits. Low lung volumes are noted. Mild atelectatic changes are seen within the bilateral lung bases. A small patchy area of scarring, atelectasis and/or infiltrate is seen within the periphery of the upper right lung. This is increased in severity when compared to the prior study. Opacification of the medial aspect of the left apex is also noted. No pleural effusion or pneumothorax is identified. Multilevel degenerative changes are seen throughout the thoracic spine. IMPRESSION: 1. Low lung volumes with mild bibasilar atelectasis. 2. Small patchy area of scarring, atelectasis and/or infiltrate within the periphery of the upper right lung. 3. Opacification of the medial left apex which may represent an additional area of airspace disease. CT correlation is recommended to further exclude the presence of  an underlying pulmonary or mediastinal mass. Electronically Signed   By: Aram Candela M.D.   On: 06/22/2023 20:08       LOS: 0 days   Yolanda Reeves  [facility] Pager on [email or web address]  06/24/2023, 10:05 AM

## 2023-06-24 NOTE — Progress Notes (Signed)
Mobility Specialist - Progress Note   06/24/23 0930  Mobility  Activity Ambulated with assistance in hallway  Level of Assistance Contact guard assist, steadying assist  Assistive Device Other (Comment) (HHA , Hallway Rail)  Distance Ambulated (ft) 150 ft  Range of Motion/Exercises Active  Activity Response Tolerated well  Mobility Referral Yes  Mobility visit 1 Mobility  Mobility Specialist Start Time (ACUTE ONLY) 0915  Mobility Specialist Stop Time (ACUTE ONLY) 0930  Mobility Specialist Time Calculation (min) (ACUTE ONLY) 15 min   Pt was found in bed and agreeable to ambulate. C/o back pain during session and requesting pain medication. At EOS returned to bed with all needs met. Call bell in reach and RN made aware pt requesting pain medication.   Billey Chang Mobility Specialist

## 2023-06-24 NOTE — Consult Note (Signed)
[facility] Psychiatric Consult Initial  Patient Name: .Yolanda Reeves  MRN: [id number]  DOB: 06-28-1945  Consult Order details:  Orders (From admission, onward)     Start     Ordered   06/23/23 1713  IP CONSULT TO PSYCHIATRY       Ordering Provider: Martina Sinner, MD  Provider:  (Not yet assigned)  Question Answer Comment  Location [facility]   Reason for Consult? Requested per patient's brother. She has been depressed and anxious over recent weeks, mentioning that she doesn't want to live anymore. Admitted after accidental overdose.      06/23/23 1713             Mode of Visit: In person    Psychiatry Consult Evaluation  Service Date: June 24, 2023 LOS:  LOS: 0 days  Chief Complaint "I feel depressed because I'm hard of hearing, going blind, and unable to drive or go to the grocery store. I don't want to kill myself but I don't care if I die."   Primary Psychiatric Diagnoses  Major depressive disorder 2.  Generalized anxiety disorder 3.  Depression due to another medical condition (hypothyroidism).   Assessment  Yolanda Reeves is a 78 y.o. female admitted: Medicallyfor 06/22/2023  5:46 PM for . She carries the psychiatric diagnoses of Major depression and Anxiety and has a past medical history of chronic pain, hyperlipidemia, hypothyroidism, tobacco abuse, and osteopenia.   Her current presentation of depressed mood, lack of motivation, low energy level, hopelessness, helplessness, suicidal ideation and recent overdose is most consistent with Major depressive disorder. A diagnoses of Depression due to another medical condition can also be entertained due to the history of poorly treated  hypothyroidism.  She meets criteria for inpatient admission based on being severely depressed, ongoing suicidal ideation, being elderly, lives alone, and inability to contract for safety saying "I don't care if I die." Current outpatient psychotropic medications  include Zoloft 150 mg daily, Lamotrigine 150 mg daily and Valium 5 mg daily for anxiety.Historically she has had poor response to these medications. patient has been compliant with these medications prior to admission per report. On initial examination, patient was calm and cooperative, she is alert and oriented to time, place, person and situation. Mood is depressed, affect is constricted.Her thought process is linear, with logical thought content. Patient is suicidal but denied homicidal ideation. Insight is fair. Please see plan below for detailed recommendations.   Diagnoses:  Active Hospital problems: Principal Problem:   Benzodiazepine overdose, accidental or unintentional, initial encounter Active Problems:   HYPOTHYROIDISM, POST-RADIOACTIVE IODINE   Chest pain, atypical   Aspiration pneumonia (HCC)   Prolonged QT interval   Lung mass   COPD with acute exacerbation (HCC)    Plan   ## Psychiatric Medication Recommendations:  -Please wean patient off Zoloft due to poor response per patient report -Please wean patient off Valium due to risk of sedation, recurrent pneumonia, risk of fall in elderly, cognitive impairment, respiratory suppression, and worsening of depression as a CNS depressant.  -Continue Lamotrigine 150 mg daily.  -Consider Mirtazapine 7.5 mg at bedtime for depression/sleep with the plan to up titrate as needed.  -Consider Buspirone 5 mg three times daily for anxiety and up titrate as needed.  -Patient will benefit from inpatient psychiatric admission once she is medically cleared due to recent overdose.  -Consider TOC/Social worker consult to facilitate inpatient psychiatric transfer once medically stable.  -Please optimize medication for hypothyroidism as the  TSH level is significantly elevated (66.58) with low T4 (0.34) indicating severe hypothyroidism which will continues to worsen depressive symptoms.   ## Medical Decision Making Capacity: Not specifically  addressed in this encounter  -- most recent EKG on 06/22/23 had QtC of 555. Please continue to monitor Qtc.  -- Pertinent labwork reviewed earlier this admission includes: HB-11.2, TSH-66.58, T4-0.34   ## Disposition:--We recommend psychiatric inpatient transfer once medically cleared. Patient accepted voluntary admission at this time. Please IVC patient if she declined voluntary admission.   ## Behavioral / Environmental: - No specific recommendations at this time.     ## Safety and Observation Level:  - Based on my clinical evaluation, I estimate the patient to be at moderate risk of self harm in the current setting. - At this time, we recommend  1:1 Observation. This decision is based on my review of the chart including patient's history and current presentation, interview of the patient, mental status examination, and consideration of suicide risk including evaluating suicidal ideation, plan, intent, suicidal or self-harm behaviors, risk factors, and protective factors. This judgment is based on our ability to directly address suicide risk, implement suicide prevention strategies, and develop a safety plan while the patient is in the clinical setting. Please contact our team if there is a concern that risk level has changed.  CSSR Risk Category:C-SSRS RISK CATEGORY: No Risk  Suicide Risk Assessment: Patient has following modifiable risk factors for suicide: active suicidal ideation, under treated depression , and social isolation, which we are addressing by prescribing medications. Patient has following non-modifiable or demographic risk factors for suicide: separation or divorce Patient has the following protective factors against suicide: Supportive family and Pets in the home  Thank you for this consult request. Recommendations have been communicated to the primary team.  We will follow up at this time.   Fredonia Highland, MD       History of Present Illness  Relevant Aspects of  [facility] Course:  Admitted on 06/22/2023 after she was found unconscious after overdosing on Valium. Patient is 78 year old female with medical history significant of chronic pain, hyperlipidemia, hypothyroidism, tobacco abuse, osteopenia, anxiety, depression, kidney stone who was brought in by EMS after she was found to be unresponsive by family member. She was apparently vomiting as well. She had taken 8 to 10 tablets of Valium 5 mg pills because she could not sleep. She denied suicidal intent. Workup in the emergency department raised concern for aspiration pneumonia and also revealed masses in the lung.She is being worked up for now.   Patient Report:  Patient seen face to face in her hospital room. She is alert and oriented x 4, calm and cooperative. Patient is hard of hearing, needs to be stay close to her and speaks very loudly before she could hear. She reports feeling depressed and hopeless due to multiple psychosocial stressors and ongoing medical issues as evidenced by being hard of hearing, going blind, inability to drive and unable to go to the [employer]. Patient indicates she took extra pills (10 pills) of Valium because she was anxious and unable to sleep. She denies suicide intent. However, patient indicates she is 78 years old and does not care if she dies. She is unable to contract for safety. She reports ongoing depressed mood, lack of motivation, low energy level, fatigue, hopelessness and helplessness. Patient also reports feeling apprehensive, nervous, restless and irritable. She reports being prescribed Valium 5 mg daily for her anxiety. She  also reports taking Lamotrigine and Zoloft for depression, however, patient states the medications are not working. 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    Patient reports two of her siblings visited her earlier today but refused this writer to talk to them saying "I need to know what is going on with me before I can allow you to talk to anyone. I don't know if I have cancer or not." Patient does not currently have a psychiatrist and is interested in one upon discharge from the hospital.   Psych ROS:  Depression: depressed mood, lack of motivation, low energy level, fatigue, hopelessness, helplessness, poor sleep, suicide ideations.  Anxiety: excessive anxiety, irritability, apprehensive, and nervousness.  Mania (lifetime and current): denies  Psychosis: (lifetime and current): occasional visual hallucinations but denies auditory hallucinations and delusions.   Collateral information:  Patient refused to give permission to contact family members or her daughter.   ROS   Psychiatric and Social History  Psychiatric History:  Information collected from the patient   Prev Dx/Sx: Major depression and Anxiety Current Psych Provider: None Home Meds (current): Zoloft 150 mg daily, Lamotrigine 150 mg daily and Valium 5 mg daily for anxiety.  Previous Med Trials: None  Therapy: denies   Prior Psych Hospitalization: denies   Prior Self Harm: denies Prior Violence: denies   Family Psych History: denies  Family Hx suicide: denies   Social History:  Educational Hx: high school graduate Occupational Hx: unemployed, retired.  Legal Hx: denies  Living Situation: lives alone in her apartment  Spiritual Hx: unknown  Access to weapons/lethal means: denies    Substance History Alcohol: denies   Type of alcohol former smoker, but currently vape Nicotine.  Last Drink unknown  Number of drinks per day N/A History of alcohol withdrawal seizures denies  History of DT's denies  Tobacco: former smoker  Illicit drugs: denies  Prescription drug abuse: denies  Rehab hx: denies   Exam Findings  Physical Exam: General appearance: Awake alert.  In  no distress Resp: Coarse breath sounds with few crackles at the bases.  Occasional scattered wheezing.  No rhonchi. Cardio: S1-S2 is normal regular.  No S3-S4.  No rubs murmurs or bruit GI: Abdomen is soft.  Nontender nondistended.  Bowel sounds are present normal.  No masses organomegaly Extremities: No edema.  Some physical deconditioning is noted Neurologic: Alert and oriented x3.  No focal neurological deficits.   Vital Signs:  Temp:  [97.8 F (36.6 C)-98.4 F (36.9 C)] 98.4 F (36.9 C) (01/18 1148) Pulse Rate:  [56-79] 56 (01/18 1148) Resp:  [16-19] 18 (01/18 1148) BP: (116-143)/(80-88) 141/80 (01/18 1148) SpO2:  [93 %-95 %] 95 % (01/18 1148) Weight:  [92.9 kg] 92.9 kg (01/17 1827) Blood pressure (!) 141/80, pulse (!) 56, temperature 98.4 F (36.9 C), temperature source Oral, resp. rate 18, height 5\' 2"  (1.575 m), weight 92.9 kg, SpO2 95%. Body mass index is 37.48 kg/m.  Physical Exam  Mental Status Exam: General Appearance: Casual and Well Groomed  Orientation:  Full (Time, Place, and Person)  Memory:  Recent;   Good  Concentration:  Concentration: Good  Recall:  Fair  Attention  Good  Eye Contact:  Minimal  Speech:  Normal Rate  Language:  Good  Volume:  Decreased  Mood: "depressed"  Affect:  Constricted  Thought Process:  Linear  Thought Content:  Logical  Suicidal Thoughts:  Yes.  without intent/plan  Homicidal Thoughts:  No  Judgement:  Poor  Insight:  Lacking  Psychomotor Activity:  Decreased  Akathisia:  No  Fund of Knowledge:  Fair      Assets:  Manufacturing systems engineer Social Support  Cognition:  WNL  ADL's:  Impaired  AIMS (if indicated):        Other History   These have been pulled in through the EMR, reviewed, and updated if appropriate.  Family History:  The patient's family history includes Cancer in her brother; Colon cancer in her paternal grandmother; Prostate cancer in her brother; Skin cancer in her sister; Thyroid cancer in her  brother.  Medical History: Past Medical History:  Diagnosis Date   Best dystrophy 2012   Chest pain, atypical    Heart murmur    HYPERLIPIDEMIA    HYPOTHYROIDISM, POST-RADIOACTIVE IODINE    OSTEOPENIA    Sleep apnea    TOBACCO USE    UNSPECIFIED SUBJECTIVE VISUAL DISTURBANCE    VITAMIN D DEFICIENCY    WART, RIGHT HAND     Surgical History: Past Surgical History:  Procedure Laterality Date   ABDOMINAL HYSTERECTOMY     CARPAL TUNNEL RELEASE     EXTRACORPOREAL SHOCK WAVE LITHOTRIPSY Right 10/15/2020   Procedure: EXTRACORPOREAL SHOCK WAVE LITHOTRIPSY (ESWL);  Surgeon: Alfredo Martinez, MD;  Location: [facility];  Service: Urology;  Laterality: Right;   EXTRACORPOREAL SHOCK WAVE LITHOTRIPSY Right 11/19/2020   Procedure: EXTRACORPOREAL SHOCK WAVE LITHOTRIPSY (ESWL);  Surgeon: Jerilee Field, MD;  Location: [facility];  Service: Urology;  Laterality: Right;   EYE SURGERY     LAPAROSCOPIC OOPHERECTOMY     LITHOTRIPSY     TONSILLECTOMY       Medications:   Current Facility-Administered Medications:    acetaminophen (TYLENOL) tablet 650 mg, 650 mg, Oral, Q6H PRN, 650 mg at 06/23/23 1034 **OR** acetaminophen (TYLENOL) suppository 650 mg, 650 mg, Rectal, Q6H PRN, Doutova, Anastassia, MD   albuterol (PROVENTIL) (2.5 MG/3ML) 0.083% nebulizer solution 2.5 mg, 2.5 mg, Nebulization, Q2H PRN, Doutova, Anastassia, MD, 2.5 mg at 06/22/23 2350   Ampicillin-Sulbactam (UNASYN) 3 g in sodium chloride 0.9 % 100 mL IVPB, 3 g, Intravenous, Q6H, Earl Many M, RPH, Last Rate: 200 mL/hr at 06/24/23 1300, 3 g at 06/24/23 1300   guaiFENesin (MUCINEX) 12 hr tablet 600 mg, 600 mg, Oral, BID, Doutova, Anastassia, MD, 600 mg at 06/24/23 0937   ibuprofen (ADVIL) tablet 600 mg, 600 mg, Oral, Q6H PRN, Melody Comas B, MD, 600 mg at 06/24/23 0936   levothyroxine (SYNTHROID) tablet 150 mcg, 150 mcg, Oral, QAC breakfast, Doutova, Anastassia, MD, 150 mcg at 06/24/23 0611    methylPREDNISolone sodium succinate (SOLU-MEDROL) 40 mg/mL injection 40 mg, 40 mg, Intravenous, Q12H, Doutova, Anastassia, MD, 40 mg at 06/24/23 1251   pravastatin (PRAVACHOL) tablet 40 mg, 40 mg, Oral, q1800, Doutova, Anastassia, MD, 40 mg at 06/23/23 1744  Allergies: No Known Allergies  Thanya Cegielski Jenita Seashore, MD

## 2023-06-24 NOTE — Plan of Care (Signed)

## 2023-06-24 NOTE — Plan of Care (Signed)
  Problem: Education: Goal: Knowledge of General Education information will improve Description: Including pain rating scale, medication(s)/side effects and non-pharmacologic comfort measures Outcome: Progressing   Problem: Health Behavior/Discharge Planning: Goal: Ability to manage health-related needs will improve Outcome: Progressing   Problem: Clinical Measurements: Goal: Ability to maintain clinical measurements within normal limits will improve Outcome: Progressing Goal: Will remain free from infection Outcome: Progressing Goal: Diagnostic test results will improve Outcome: Progressing Goal: Respiratory complications will improve Outcome: Progressing Goal: Cardiovascular complication will be avoided Outcome: Progressing   Problem: Activity: Goal: Risk for activity intolerance will decrease Outcome: Progressing   Problem: Nutrition: Goal: Adequate nutrition will be maintained Outcome: Progressing   Problem: Coping: Goal: Level of anxiety will decrease Outcome: Progressing   Problem: Elimination: Goal: Will not experience complications related to bowel motility Outcome: Progressing Goal: Will not experience complications related to urinary retention Outcome: Progressing   Problem: Pain Managment: Goal: General experience of comfort will improve and/or be controlled Outcome: Progressing   Problem: Skin Integrity: Goal: Risk for impaired skin integrity will decrease Outcome: Progressing   Problem: Activity: Goal: Ability to tolerate increased activity will improve Outcome: Progressing   Problem: Clinical Measurements: Goal: Ability to maintain a body temperature in the normal range will improve Outcome: Progressing   Problem: Respiratory: Goal: Ability to maintain adequate ventilation will improve Outcome: Progressing Goal: Ability to maintain a clear airway will improve Outcome: Progressing   Problem: Safety: Goal: Ability to remain free from injury  will improve Outcome: Not Progressing

## 2023-06-25 ENCOUNTER — Encounter (HOSPITAL_COMMUNITY): Payer: Self-pay | Admitting: Internal Medicine

## 2023-06-25 ENCOUNTER — Inpatient Hospital Stay (HOSPITAL_COMMUNITY): Payer: Medicare Other

## 2023-06-25 DIAGNOSIS — E018 Other iodine-deficiency related thyroid disorders and allied conditions: Secondary | ICD-10-CM | POA: Diagnosis not present

## 2023-06-25 DIAGNOSIS — D3502 Benign neoplasm of left adrenal gland: Secondary | ICD-10-CM | POA: Diagnosis not present

## 2023-06-25 DIAGNOSIS — J690 Pneumonitis due to inhalation of food and vomit: Secondary | ICD-10-CM | POA: Diagnosis not present

## 2023-06-25 DIAGNOSIS — K769 Liver disease, unspecified: Secondary | ICD-10-CM | POA: Diagnosis not present

## 2023-06-25 DIAGNOSIS — M19012 Primary osteoarthritis, left shoulder: Secondary | ICD-10-CM | POA: Diagnosis not present

## 2023-06-25 DIAGNOSIS — D630 Anemia in neoplastic disease: Secondary | ICD-10-CM | POA: Diagnosis not present

## 2023-06-25 DIAGNOSIS — E66813 Obesity, class 3: Secondary | ICD-10-CM | POA: Diagnosis not present

## 2023-06-25 DIAGNOSIS — M25512 Pain in left shoulder: Secondary | ICD-10-CM | POA: Diagnosis not present

## 2023-06-25 DIAGNOSIS — K8020 Calculus of gallbladder without cholecystitis without obstruction: Secondary | ICD-10-CM | POA: Diagnosis not present

## 2023-06-25 DIAGNOSIS — I252 Old myocardial infarction: Secondary | ICD-10-CM | POA: Diagnosis not present

## 2023-06-25 DIAGNOSIS — Z6841 Body Mass Index (BMI) 40.0 and over, adult: Secondary | ICD-10-CM | POA: Diagnosis not present

## 2023-06-25 DIAGNOSIS — I10 Essential (primary) hypertension: Secondary | ICD-10-CM | POA: Diagnosis not present

## 2023-06-25 DIAGNOSIS — R531 Weakness: Secondary | ICD-10-CM | POA: Diagnosis not present

## 2023-06-25 DIAGNOSIS — Y842 Radiological procedure and radiotherapy as the cause of abnormal reaction of the patient, or of later complication, without mention of misadventure at the time of the procedure: Secondary | ICD-10-CM | POA: Diagnosis present

## 2023-06-25 DIAGNOSIS — F332 Major depressive disorder, recurrent severe without psychotic features: Secondary | ICD-10-CM | POA: Diagnosis present

## 2023-06-25 DIAGNOSIS — N179 Acute kidney failure, unspecified: Secondary | ICD-10-CM | POA: Diagnosis not present

## 2023-06-25 DIAGNOSIS — Y92009 Unspecified place in unspecified non-institutional (private) residence as the place of occurrence of the external cause: Secondary | ICD-10-CM | POA: Diagnosis not present

## 2023-06-25 DIAGNOSIS — E785 Hyperlipidemia, unspecified: Secondary | ICD-10-CM | POA: Diagnosis not present

## 2023-06-25 DIAGNOSIS — G893 Neoplasm related pain (acute) (chronic): Secondary | ICD-10-CM | POA: Diagnosis not present

## 2023-06-25 DIAGNOSIS — T17908A Unspecified foreign body in respiratory tract, part unspecified causing other injury, initial encounter: Secondary | ICD-10-CM | POA: Diagnosis not present

## 2023-06-25 DIAGNOSIS — R911 Solitary pulmonary nodule: Secondary | ICD-10-CM | POA: Diagnosis not present

## 2023-06-25 DIAGNOSIS — C787 Secondary malignant neoplasm of liver and intrahepatic bile duct: Secondary | ICD-10-CM | POA: Diagnosis not present

## 2023-06-25 DIAGNOSIS — C779 Secondary and unspecified malignant neoplasm of lymph node, unspecified: Secondary | ICD-10-CM | POA: Diagnosis not present

## 2023-06-25 DIAGNOSIS — C801 Malignant (primary) neoplasm, unspecified: Secondary | ICD-10-CM | POA: Diagnosis not present

## 2023-06-25 DIAGNOSIS — E890 Postprocedural hypothyroidism: Secondary | ICD-10-CM | POA: Diagnosis present

## 2023-06-25 DIAGNOSIS — C3412 Malignant neoplasm of upper lobe, left bronchus or lung: Secondary | ICD-10-CM | POA: Diagnosis not present

## 2023-06-25 DIAGNOSIS — J441 Chronic obstructive pulmonary disease with (acute) exacerbation: Secondary | ICD-10-CM | POA: Diagnosis not present

## 2023-06-25 DIAGNOSIS — Z66 Do not resuscitate: Secondary | ICD-10-CM | POA: Diagnosis not present

## 2023-06-25 DIAGNOSIS — C3490 Malignant neoplasm of unspecified part of unspecified bronchus or lung: Secondary | ICD-10-CM | POA: Diagnosis not present

## 2023-06-25 DIAGNOSIS — Z515 Encounter for palliative care: Secondary | ICD-10-CM | POA: Diagnosis not present

## 2023-06-25 DIAGNOSIS — F411 Generalized anxiety disorder: Secondary | ICD-10-CM | POA: Diagnosis present

## 2023-06-25 DIAGNOSIS — G929 Unspecified toxic encephalopathy: Secondary | ICD-10-CM | POA: Diagnosis not present

## 2023-06-25 DIAGNOSIS — F0394 Unspecified dementia, unspecified severity, with anxiety: Secondary | ICD-10-CM | POA: Diagnosis present

## 2023-06-25 DIAGNOSIS — W19XXXA Unspecified fall, initial encounter: Secondary | ICD-10-CM | POA: Diagnosis not present

## 2023-06-25 DIAGNOSIS — Y92231 Patient bathroom in hospital as the place of occurrence of the external cause: Secondary | ICD-10-CM | POA: Diagnosis not present

## 2023-06-25 DIAGNOSIS — Z7189 Other specified counseling: Secondary | ICD-10-CM | POA: Diagnosis not present

## 2023-06-25 DIAGNOSIS — T424X1A Poisoning by benzodiazepines, accidental (unintentional), initial encounter: Secondary | ICD-10-CM | POA: Diagnosis not present

## 2023-06-25 DIAGNOSIS — R918 Other nonspecific abnormal finding of lung field: Secondary | ICD-10-CM | POA: Diagnosis not present

## 2023-06-25 DIAGNOSIS — R9431 Abnormal electrocardiogram [ECG] [EKG]: Secondary | ICD-10-CM | POA: Diagnosis not present

## 2023-06-25 DIAGNOSIS — G8929 Other chronic pain: Secondary | ICD-10-CM | POA: Diagnosis not present

## 2023-06-25 DIAGNOSIS — G894 Chronic pain syndrome: Secondary | ICD-10-CM | POA: Diagnosis present

## 2023-06-25 DIAGNOSIS — F0393 Unspecified dementia, unspecified severity, with mood disturbance: Secondary | ICD-10-CM | POA: Diagnosis present

## 2023-06-25 DIAGNOSIS — R160 Hepatomegaly, not elsewhere classified: Secondary | ICD-10-CM | POA: Diagnosis not present

## 2023-06-25 DIAGNOSIS — F03918 Unspecified dementia, unspecified severity, with other behavioral disturbance: Secondary | ICD-10-CM | POA: Diagnosis present

## 2023-06-25 LAB — CULTURE, RESPIRATORY W GRAM STAIN: Gram Stain: NONE SEEN

## 2023-06-25 MED ORDER — LIDOCAINE 5 % EX PTCH
1.0000 | MEDICATED_PATCH | CUTANEOUS | Status: AC
  Administered 2023-06-25 – 2023-06-27 (×3): 1 via TRANSDERMAL
  Filled 2023-06-25 (×3): qty 1

## 2023-06-25 MED ORDER — AMOXICILLIN-POT CLAVULANATE 875-125 MG PO TABS
1.0000 | ORAL_TABLET | Freq: Two times a day (BID) | ORAL | Status: AC
  Administered 2023-06-25 – 2023-06-28 (×8): 1 via ORAL
  Filled 2023-06-25 (×8): qty 1

## 2023-06-25 MED ORDER — POLYETHYLENE GLYCOL 3350 17 G PO PACK
17.0000 g | PACK | Freq: Once | ORAL | Status: AC
  Administered 2023-06-25: 17 g via ORAL
  Filled 2023-06-25: qty 1

## 2023-06-25 MED ORDER — POTASSIUM CHLORIDE CRYS ER 20 MEQ PO TBCR
40.0000 meq | EXTENDED_RELEASE_TABLET | Freq: Once | ORAL | Status: AC
  Administered 2023-06-25: 40 meq via ORAL
  Filled 2023-06-25: qty 2

## 2023-06-25 MED ORDER — GADOBUTROL 1 MMOL/ML IV SOLN
9.0000 mL | Freq: Once | INTRAVENOUS | Status: AC | PRN
  Administered 2023-06-25: 9 mL via INTRAVENOUS

## 2023-06-25 MED ORDER — NICOTINE 14 MG/24HR TD PT24
14.0000 mg | MEDICATED_PATCH | Freq: Every day | TRANSDERMAL | Status: DC
Start: 2023-06-25 — End: 2023-07-10
  Administered 2023-06-25 – 2023-07-08 (×14): 14 mg via TRANSDERMAL
  Filled 2023-06-25 (×16): qty 1

## 2023-06-25 MED ORDER — PREDNISONE 20 MG PO TABS
40.0000 mg | ORAL_TABLET | Freq: Every day | ORAL | Status: DC
  Administered 2023-06-25 – 2023-06-26 (×2): 40 mg via ORAL
  Filled 2023-06-25 (×2): qty 2

## 2023-06-25 NOTE — Progress Notes (Signed)
Mobility Specialist - Progress Note   06/25/23 1623  Mobility  Activity Ambulated with assistance in hallway;Ambulated independently to bathroom  Level of Assistance Contact guard assist, steadying assist  Assistive Device None  Distance Ambulated (ft) 150 ft  Range of Motion/Exercises Active  Activity Response Tolerated fair  Mobility Referral Yes  Mobility visit 1 Mobility  Mobility Specialist Start Time (ACUTE ONLY) 1612  Mobility Specialist Stop Time (ACUTE ONLY) 1623  Mobility Specialist Time Calculation (min) (ACUTE ONLY) 11 min   Pt was found in bed and agreeable to ambulate. Stated feeling wobbly, disoriented, and dizzy during session. At EOS returned to bed with all needs met. Call bell in reach.  Billey Chang Mobility Specialist

## 2023-06-25 NOTE — Progress Notes (Addendum)
[facility] PROGRESS NOTE   ZEBA CERIO ZOX:[id number] DOB: 17-Jul-1945 DOA: 06/22/2023  PCP: Pearline Cables, MD  Brief History: 78 y.o. female with medical history significant of chronic pain, hyperlipidemia hypothyroidism tobacco abuse osteopenia, and anxiety depression, kidney stone who was brought in by EMS after she was found to be unresponsive by family member.  She was apparently vomiting as well.  She had taken 8 to 10 tablets of Valium because she could not sleep.  She denied suicidal intent.  Workup in the emergency department raised concern for aspiration pneumonia and also revealed masses in the lung.  She was hospitalized for further management.  Consultants: Pulmonology.  Interventional radiology  Procedures: Echocardiogram    Subjective/Interval History: Patient denies shortness of breath or chest pain.  Admits to being depressed but denies any suicidal ideation.  Does complain of left-sided back pain which improved after she was given pain medications.  Assessment/Plan:  Aspiration pneumonia with concern for acute bronchitis/history of COPD Likely as a result of the unintentional overdose.   Seen by speech therapy.  No needs identified. Change Unasyn to Augmentin.  Will change Solu-Medrol to prednisone. Nebulizer treatment as needed.  Not requiring any oxygen currently.  Unintentional benzodiazepine overdose She took 8 to 10 tablets of Valium in order to go to sleep.  Denied any suicidal ideation.  Holding benzodiazepines for now.  She is now fully awake and alert. Not certain how she acquired the Valium.  No sedative or narcotic prescription data is noted on the prescriber database.  She has not been prescribed any of these medications recently, at least locally. Psychiatry was consulted by pulmonology.  Seen by psychiatry yesterday who made medication recommendations which included continuing lamotrigine and initiating mirtazapine at bedtime and  buspirone 3 times a day.  Will check EKG to make sure QT prolongation has resolved before initiating mirtazapine.   Psychiatry also recommend inpatient psychiatric admission.  Will have psychiatry reassess once medical workup has been completed to see if she still would need inpatient psychiatric admission.  She denies any suicidal ideation at this time.  Lung mass/liver lesions concerning for metastatic process She underwent a CT angiogram which was negative for PE but showed a large left apical lung mass measuring up to 6.2 cm.  Additional spiculated solid mass in the right upper lobe measuring 2.4 cm.  Pulmonary nodules on the left side were also noted.  Enlarged left hilar and AP window lymph nodes were also noted.  She is a smoker.  Used to smoke cigarettes and now she vapes. CT of the abdomen pelvis raises concern for metastatic process in the liver and elsewhere.  Pulmonology suggested biopsying one of the liver lesions.  IR wants to better characterize these lesions before attempting biopsy so MRI of the abdomen has been ordered.  MRI abdomen remains pending.  Hypothyroidism Supposed to be on 125 mcg of Synthroid on a daily basis. Her TSH was noted to be 66.5.  Free T4 noted to be 0.34.  Compliance is questionable.  Dose of levothyroxine was increased.   Thyroid function test will need to be rechecked in a few weeks.  Hyperlipidemia Continue statin.  QT prolongation Monitor on telemetry.  Avoid QT prolonging medications.  Atypical chest pain Likely due to lung mass.  No ischemic changes on EKG.   Echocardiogram shows normal LVEF with grade 1 diastolic dysfunction.  No wall motion abnormalities.  No significant valvular disease noted. No further cardiac workup at this time.  Symptoms could be due to her lung mass.  Normocytic anemia There was dilutional drop in hemoglobin but stable for the most part.  Class III obesity Estimated body mass index is 37.48 kg/m as calculated from the  following:   Height as of this encounter: 5\' 2"  (1.575 m).   Weight as of this encounter: 92.9 kg.   DVT Prophylaxis: SCDs only for now in case of procedure needs to be performed Code Status: Full code Family Communication: Discussed with the patient Disposition Plan: Start mobilizing.  Psychiatry recommends inpatient psychiatric admission.     Medications: Scheduled:  busPIRone  5 mg Oral TID   guaiFENesin  600 mg Oral BID   lamoTRIgine  150 mg Oral Daily   levothyroxine  150 mcg Oral QAC breakfast   methylPREDNISolone (SOLU-MEDROL) injection  40 mg Intravenous Q12H   nicotine  14 mg Transdermal Daily   pravastatin  40 mg Oral q1800   Continuous:  ampicillin-sulbactam (UNASYN) IV 3 g (06/25/23 0538)   UJW:[id number] **OR** acetaminophen, albuterol, ibuprofen  Antibiotics: Anti-infectives (From admission, onward)    Start     Dose/Rate Route Frequency Ordered Stop   06/23/23 1200  Ampicillin-Sulbactam (UNASYN) 3 g in sodium chloride 0.9 % 100 mL IVPB        3 g 200 mL/hr over 30 Minutes Intravenous Every 6 hours 06/23/23 1045     06/23/23 0500  Ampicillin-Sulbactam (UNASYN) 3 g in sodium chloride 0.9 % 100 mL IVPB  Status:  Discontinued        3 g 200 mL/hr over 30 Minutes Intravenous Every 8 hours 06/22/23 2308 06/23/23 1045   06/22/23 2045  Ampicillin-Sulbactam (UNASYN) 3 g in sodium chloride 0.9 % 100 mL IVPB        3 g 200 mL/hr over 30 Minutes Intravenous  Once 06/22/23 2042 06/22/23 2147       Objective:  Vital Signs  Vitals:   06/24/23 0250 06/24/23 1148 06/24/23 2112 06/25/23 0546  BP: (!) 143/85 (!) 141/80 134/75 (!) 144/96  Pulse: 67 (!) 56 66 (!) 59  Resp: 19 18 (!) 21 18  Temp: 98.3 F (36.8 C) 98.4 F (36.9 C) 97.9 F (36.6 C) 98 F (36.7 C)  TempSrc: Oral Oral Oral   SpO2: 94% 95% 96% 95%  Weight:      Height:        Intake/Output Summary (Last 24 hours) at 06/25/2023 0939 Last data filed at 06/25/2023 0700 Gross per 24 hour  Intake  800 ml  Output --  Net 800 ml   Filed Weights   06/22/23 1752 06/23/23 1827  Weight: 98.4 kg 92.9 kg    General appearance: Awake alert.  In no distress Resp: Breath sounds without any wheezing or rhonchi.  No definite crackles. Cardio: S1-S2 is normal regular.  No S3-S4.  No rubs murmurs or bruit GI: Abdomen is soft.  Nontender nondistended.  Bowel sounds are present normal.  No masses organomegaly Extremities: No edema.  Full range of motion of lower extremities. Neurologic: Alert and oriented x3.  No focal neurological deficits.     Lab Results:  Data Reviewed: I have personally reviewed following labs and reports of the imaging studies  CBC: Recent Labs  Lab 06/22/23 1900 06/23/23 0443 06/24/23 0430  WBC 5.4 5.8 7.5  NEUTROABS 3.3  --   --   HGB 12.0 11.9* 11.2*  HCT 39.2 38.1 35.3*  MCV 95.6 96.7 94.4  PLT 181 170 194    Basic  Metabolic Panel: Recent Labs  Lab 06/22/23 1900 06/23/23 0443 06/24/23 0430  NA 141 141 137  K 3.6 3.8 3.7  CL 107 108 105  CO2 26 24 23   GLUCOSE 90 106* 120*  BUN 16 13 16   CREATININE 1.09* 0.94 0.81  CALCIUM 8.7* 8.7* 8.6*  MG 2.5* 2.3  --   PHOS 3.8 3.6  --     GFR: Estimated Creatinine Clearance: 62.7 mL/min (by C-G formula based on SCr of 0.81 mg/dL).  Liver Function Tests: Recent Labs  Lab 06/22/23 1900 06/23/23 0443  AST 14* 12*  ALT 9 8  ALKPHOS 58 61  BILITOT 0.8 0.4  PROT 6.7 6.8  ALBUMIN 3.2* 3.4*    Coagulation Profile: Recent Labs  Lab 06/22/23 2343  INR 1.0    Cardiac Enzymes: Recent Labs  Lab 06/22/23 1900  CKTOTAL 54    CBG: Recent Labs  Lab 06/22/23 1756  GLUCAP 100*    Thyroid Function Tests: Recent Labs    06/22/23 1900 06/22/23 2343  TSH 66.588*  --   FREET4  --  0.34*     Recent Results (from the past 240 hours)  Culture, blood (routine x 2) Call MD if unable to obtain prior to antibiotics being given     Status: None (Preliminary result)   Collection Time: 06/22/23  11:00 PM   Specimen: BLOOD RIGHT HAND  Result Value Ref Range Status   Specimen Description   Final    BLOOD RIGHT HAND Performed at [facility], [address], [place], [place] [address]    Special Requests   Final    BOTTLES DRAWN AEROBIC AND ANAEROBIC Blood Culture results may not be optimal due to an inadequate volume of blood received in culture bottles Performed at [facility], [address], [place], [place] [address]    Culture   Final    NO GROWTH 1 DAY Performed at [facility], [address], [place], [place] [address]    Report Status PENDING  Incomplete  Culture, blood (routine x 2) Call MD if unable to obtain prior to antibiotics being given     Status: None (Preliminary result)   Collection Time: 06/22/23 11:15 PM   Specimen: BLOOD LEFT HAND  Result Value Ref Range Status   Specimen Description   Final    BLOOD LEFT HAND Performed at [facility], [address] [place], [place] [address]    Special Requests   Final    BOTTLES DRAWN AEROBIC AND ANAEROBIC Blood Culture results may not be optimal due to an inadequate volume of blood received in culture bottles Performed at [facility], [address] [place], [place] [address]    Culture   Final    NO GROWTH 1 DAY Performed at [facility], [address] [place], [place] [address]    Report Status PENDING  Incomplete  Expectorated Sputum Assessment w Gram Stain, Rflx to Resp Cult     Status: None   Collection Time: 06/23/23  8:05 AM   Specimen: Expectorated Sputum  Result Value Ref Range Status   Specimen Description EXPECTORATED SPUTUM  Final   Special Requests NONE  Final   Sputum evaluation   Final    THIS SPECIMEN IS ACCEPTABLE FOR SPUTUM CULTURE Performed at [facility], [address], [place], [place] [address]    Report Status 06/24/2023 FINAL  Final  Culture, Respiratory w Gram Stain  Status: None (Preliminary result)   Collection Time: 06/23/23  8:05 AM  Result Value Ref Range Status   Specimen Description   Final    EXPECTORATED SPUTUM Performed at [facility], [address], [place], [place] [address]    Special Requests   Final    NONE Reflexed from [id number] Performed at [facility], [address], [place], [place] [address]    Gram Stain   Final    NO WBC SEEN RARE GRAM POSITIVE COCCI IN CHAINS RARE GRAM POSITIVE RODS    Culture   Final    CULTURE REINCUBATED FOR BETTER GROWTH Performed at [facility], [address], [place], [place] [address]    Report Status PENDING  Incomplete      Radiology Studies: MR BRAIN W WO CONTRAST Result Date: 06/23/2023 CLINICAL DATA:  Metastatic disease evaluation.  Lung cancer. EXAM: MRI HEAD WITHOUT AND WITH CONTRAST TECHNIQUE: Multiplanar, multiecho pulse sequences of the brain and surrounding structures were obtained without and with intravenous contrast. CONTRAST:  10mL GADAVIST GADOBUTROL 1 MMOL/ML IV SOLN COMPARISON:  01/06/2009 FINDINGS: Brain: Diffusion imaging does not show any acute or subacute infarction or other cause of restricted diffusion. Brain shows atrophy with extensive chronic small-vessel ischemic changes affecting the pons and cerebral hemispheric white matter. No large vessel territory stroke. No evidence of mass lesion, hemorrhage, hydrocephalus or extra-axial collection. After contrast administration, no abnormal brain or leptomeningeal enhancement occurs. Vascular: Major vessels at the base of the brain show flow. Skull and upper cervical spine: Negative Sinuses/Orbits: Clear/normal Other: None IMPRESSION: No evidence of metastatic disease. Atrophy with extensive chronic small-vessel ischemic changes of the pons and cerebral hemispheric white matter. Electronically Signed   By: Paulina Fusi M.D.   On: 06/23/2023 14:33   CT ABDOMEN PELVIS W CONTRAST Result  Date: 06/23/2023 CLINICAL DATA:  Staging lung cancer.  * Tracking Code: BO * EXAM: CT ABDOMEN AND PELVIS WITH CONTRAST TECHNIQUE: Multidetector CT imaging of the abdomen and pelvis was performed using the standard protocol following bolus administration of intravenous contrast. RADIATION DOSE REDUCTION: This exam was performed according to the departmental dose-optimization program which includes automated exposure control, adjustment of the mA and/or kV according to patient size and/or use of iterative reconstruction technique. CONTRAST:  OMNIPAQUE IOHEXOL 300 MG/ML  SOLN COMPARISON:  Abdomen pelvis CT 10/05/2020. CT angiogram chest 06/22/2023 FINDINGS: Lower chest: Please correlate with CT of the chest from 06/22/2023. Again left lower lobe lung nodule seen on series 6, image 8. Pericardial effusion. Hepatobiliary: Multiple liver masses identified worrisome for metastatic disease. At least 7 lesions are identified. These small 1 of the larger foci seen anteriorly in segment 2 measuring 12 by 10 mm on series 2, image 12. Patent portal vein. Gallbladder is nondilated. Pancreas: Unremarkable. No pancreatic ductal dilatation or surrounding inflammatory changes. Spleen: T1 Adrenals/Urinary Tract: Right adrenal gland is slightly nodular. On series 2, image 25 this measures 13 by 11 mm. There is a left adrenal nodule as well measuring 16 by 10 mm. Indeterminate. There is contrast in the renal collecting systems related to the prior exam. Mild bilateral renal atrophy. There is a lower pole lesion on the right measuring on series 2, image 42 2.1 by 1.6 cm. This has Hounsfield units of 51 on portal venous phase in on delayed forty-three. This has with a lobular margins on coronal imaging and is suspicious. There are some other lesions in each kidney which are also indeterminate  based on appearance today. Not clearly cysts. Ureters have normal course and caliber down to the bladder. Preserved contours of the urinary  bladder. Stomach/Bowel: Large bowel has scattered stool and diverticula. There are redundant course of the colon diffusely. The cecum actually resides in the anterior left midabdomen. Normal appendix extends caudal to the cecum. Small bowel is nondilated with several small bowel loops extending to the right side lateral to the course of the ascending colon. Stomach is nondilated. Small hiatal hernia. Vascular/Lymphatic: Aortic atherosclerosis. No enlarged abdominal or pelvic lymph nodes. Reproductive: Prostate is unremarkable. Other: No free air or free fluid. Musculoskeletal: Scattered degenerative changes of the spine and pelvis. Transitional lumbosacral segment. IMPRESSION: Multiple aggressive appearing liver lesions worrisome for metastatic disease. Renal lesions are also seen which are indeterminate. One lesion in particular along the lower pole of the right kidney has a aggressive features and could be primary lesion versus metastatic focus. Bilateral adrenal nodules are noted which are indeterminate. Overall with although these areas MRI may be of some benefit with and without contrast dynamic to further delineate the etiology of the structures. No bowel obstruction. Colonic diverticula. Atypical course of the colon with the cecum in the anterior left midabdomen. Please correlate with previous CT angiogram of the chest. Electronically Signed   By: Karen Kays M.D.   On: 06/23/2023 11:43   ECHOCARDIOGRAM COMPLETE Result Date: 06/23/2023    ECHOCARDIOGRAM REPORT   Patient Name:   ABBAGAYLE WENGER Date of Exam: 06/23/2023 Medical Rec #:  [id number]       Height:       61.0 in Accession #:    [id number]      Weight:       216.9 lb Date of Birth:  [birth year]       BSA:          1.955 m Patient Age:    77 years        BP:           149/71 mmHg Patient Gender: F               HR:           57 bpm. Exam Location:  Inpatient Procedure: 2D Echo, Cardiac Doppler and Color Doppler Indications:    R94.31 Abnormal EKG   History:        Patient has no prior history of Echocardiogram examinations.                 COPD and HLD; Signs/Symptoms:Chest Pain.  Sonographer:    Webb Laws Referring Phys: [id number] ANASTASSIA DOUTOVA IMPRESSIONS  1. Left ventricular ejection fraction, by estimation, is 60 to 65%. The left ventricle has normal function. The left ventricle has no regional wall motion abnormalities. Left ventricular diastolic parameters are consistent with Grade I diastolic dysfunction (impaired relaxation). Elevated left ventricular end-diastolic pressure.  2. Right ventricular systolic function is normal. The right ventricular size is normal.  3. The mitral valve is abnormal. No evidence of mitral valve regurgitation. No evidence of mitral stenosis. Moderate mitral annular calcification.  4. The aortic valve is tricuspid. There is mild calcification of the aortic valve. There is mild thickening of the aortic valve. Aortic valve regurgitation is not visualized. Aortic valve sclerosis is present, with no evidence of aortic valve stenosis.  5. The inferior vena cava is dilated in size with >50% respiratory variability, suggesting right atrial pressure of 8 mmHg. FINDINGS  Left Ventricle: Left ventricular ejection fraction, by  estimation, is 60 to 65%. The left ventricle has normal function. The left ventricle has no regional wall motion abnormalities. The left ventricular internal cavity size was normal in size. There is  no left ventricular hypertrophy. Left ventricular diastolic parameters are consistent with Grade I diastolic dysfunction (impaired relaxation). Elevated left ventricular end-diastolic pressure. Right Ventricle: The right ventricular size is normal. No increase in right ventricular wall thickness. Right ventricular systolic function is normal. Left Atrium: Left atrial size was normal in size. Right Atrium: Right atrial size was normal in size. Pericardium: There is no evidence of pericardial effusion. Mitral  Valve: The mitral valve is abnormal. There is mild thickening of the mitral valve leaflet(s). There is mild calcification of the mitral valve leaflet(s). Moderate mitral annular calcification. No evidence of mitral valve regurgitation. No evidence  of mitral valve stenosis. Tricuspid Valve: The tricuspid valve is normal in structure. Tricuspid valve regurgitation is not demonstrated. No evidence of tricuspid stenosis. Aortic Valve: The aortic valve is tricuspid. There is mild calcification of the aortic valve. There is mild thickening of the aortic valve. Aortic valve regurgitation is not visualized. Aortic valve sclerosis is present, with no evidence of aortic valve stenosis. Pulmonic Valve: The pulmonic valve was normal in structure. Pulmonic valve regurgitation is not visualized. No evidence of pulmonic stenosis. Aorta: The aortic root is normal in size and structure. Venous: The inferior vena cava is dilated in size with greater than 50% respiratory variability, suggesting right atrial pressure of 8 mmHg. IAS/Shunts: No atrial level shunt detected by color flow Doppler.  LEFT VENTRICLE PLAX 2D LVIDd:         4.20 cm     Diastology LVIDs:         2.90 cm     LV e' medial:    3.26 cm/s LV PW:         1.10 cm     LV E/e' medial:  18.7 LV IVS:        0.90 cm     LV e' lateral:   4.35 cm/s LVOT diam:     1.90 cm     LV E/e' lateral: 14.0 LV SV:         55 LV SV Index:   28 LVOT Area:     2.84 cm  LV Volumes (MOD) LV vol d, MOD A2C: 35.3 ml LV vol d, MOD A4C: 34.1 ml LV vol s, MOD A2C: 8.3 ml LV vol s, MOD A4C: 10.4 ml LV SV MOD A2C:     27.0 ml LV SV MOD A4C:     34.1 ml LV SV MOD BP:      25.7 ml RIGHT VENTRICLE             IVC RV Basal diam:  3.90 cm     IVC diam: 2.30 cm RV S prime:     10.10 cm/s TAPSE (M-mode): 2.5 cm LEFT ATRIUM             Index        RIGHT ATRIUM           Index LA diam:        2.80 cm 1.43 cm/m   RA Area:     13.90 cm LA Vol (A2C):   18.1 ml 9.26 ml/m   RA Volume:   33.10 ml  16.93  ml/m LA Vol (A4C):   20.2 ml 10.33 ml/m LA Biplane Vol: 20.0 ml 10.23 ml/m  AORTIC VALVE LVOT  Vmax:   90.90 cm/s LVOT Vmean:  58.600 cm/s LVOT VTI:    0.195 m  AORTA Ao Root diam: 3.10 cm Ao Asc diam:  3.40 cm MITRAL VALVE               TRICUSPID VALVE MV Area (PHT): 2.69 cm    TR Peak grad:   13.1 mmHg MV Decel Time: 282 msec    TR Vmax:        181.00 cm/s MV E velocity: 60.80 cm/s MV A velocity: 97.30 cm/s  SHUNTS MV E/A ratio:  0.62        Systemic VTI:  0.20 m                            Systemic Diam: 1.90 cm Charlton Haws MD Electronically signed by Charlton Haws MD Signature Date/Time: 06/23/2023/9:51:44 AM    Final        LOS: 0 days   Osvaldo Shipper  [facility] Pager on [email or web address]  06/25/2023, 9:39 AM

## 2023-06-25 NOTE — Consult Note (Signed)
 [facility] Consult Follow-Up  Patient Name: .Yolanda Reeves  MRN: [id number]  DOB: Feb 06, 1946  Consult Order details:  Orders (From admission, onward)     Start     Ordered   06/23/23 1713  IP CONSULT TO PSYCHIATRY       Ordering Provider: Martina Sinner, MD  Provider:  (Not yet assigned)  Question Answer Comment  Location [facility]   Reason for Consult? Requested per patient's brother. She has been depressed and anxious over recent weeks, mentioning that she doesn't want to live anymore. Admitted after accidental overdose.      06/23/23 1713             Mode of Visit: In person    Psychiatry Consult Evaluation  Service Date: June 25, 2023 LOS:  LOS: 0 days  Chief Complaint "I feel depressed because I'm hard of hearing, going blind, and unable to drive or go to the grocery store. I don't want to kill myself but I don't care if I die."   Primary Psychiatric Diagnoses  Major depressive disorder 2.  Generalized anxiety disorder 3.  Depression due to another medical condition (hypothyroidism).   Assessment  Yolanda Reeves is a 78 y.o. female admitted: Medicallyfor 06/22/2023  5:46 PM for . She carries the psychiatric diagnoses of Major depression and Anxiety and has a past medical history of chronic pain, hyperlipidemia, hypothyroidism, tobacco abuse, and osteopenia.   Her current presentation of depressed mood, lack of motivation, low energy level, hopelessness, helplessness, suicidal ideation and recent overdose is most consistent with Major depressive disorder. A diagnoses of Depression due to another medical condition can also be entertained due to the history of poorly treated  hypothyroidism.  She meets criteria for inpatient admission based on being severely depressed, ongoing suicidal ideation, being elderly, lives alone, and inability to contract for safety saying "I don't care if I die." Current outpatient psychotropic medications  include Zoloft 150 mg daily, Lamotrigine 150 mg daily and Valium 5 mg daily for anxiety.Historically she has had poor response to these medications. patient has been compliant with these medications prior to admission per report. On initial examination, patient was calm and cooperative, she is alert and oriented to time, place, person and situation. Mood is depressed, affect is constricted.Her thought process is linear, with logical thought content. Patient is suicidal but denied homicidal ideation. Insight is fair. Please see plan below for detailed recommendations.   Diagnoses:  Active Hospital problems: Principal Problem:   Benzodiazepine overdose, accidental or unintentional, initial encounter Active Problems:   HYPOTHYROIDISM, POST-RADIOACTIVE IODINE   Chest pain, atypical   Aspiration pneumonia (HCC)   Prolonged QT interval   Lung mass   COPD with acute exacerbation (HCC)   Severe episode of recurrent major depressive disorder, without psychotic features (HCC)    Plan   ## Psychiatric Medication Recommendations:  -Please wean patient off Valium due to risk of sedation, recurrent pneumonia, risk of fall in elderly, cognitive impairment, respiratory suppression, and worsening of depression as a CNS depressant.  -Continue Lamotrigine 150 mg daily.  -Consider Buspirone 5 mg three times daily for anxiety and up titrate as needed.  -Patient will benefit from inpatient psychiatric admission once she is medically cleared due to recent overdose.  -Consider TOC/Social worker consult to facilitate inpatient psychiatric transfer once medically stable.  -Please optimize medication for hypothyroidism as the TSH level is significantly elevated (66.58) with low T4 (0.34) indicating severe hypothyroidism which will continues  to worsen depressive symptoms.   ## Medical Decision Making Capacity: Not specifically addressed in this encounter  -- most recent EKG on 06/22/23 had QtC of 555. Please continue  to monitor Qtc, order placed. -- Pertinent labwork reviewed earlier this admission includes: HB-11.2, TSH-66.58, T4-0.34  ## Disposition:--We recommend psychiatric inpatient transfer once medically cleared. Patient accepted voluntary admission at this time. Please IVC patient if she declined voluntary admission.   ## Behavioral / Environmental: - No specific recommendations at this time.     ## Safety and Observation Level:  - Based on my clinical evaluation, I estimate the patient to be at moderate risk of self harm in the current setting. - At this time, we recommend  1:1 Observation. This decision is based on my review of the chart including patient's history and current presentation, interview of the patient, mental status examination, and consideration of suicide risk including evaluating suicidal ideation, plan, intent, suicidal or self-harm behaviors, risk factors, and protective factors. This judgment is based on our ability to directly address suicide risk, implement suicide prevention strategies, and develop a safety plan while the patient is in the clinical setting. Please contact our team if there is a concern that risk level has changed.  CSSR Risk Category:C-SSRS RISK CATEGORY: No Risk  Suicide Risk Assessment: Patient has following modifiable risk factors for suicide: active suicidal ideation, under treated depression , and social isolation, which we are addressing by prescribing medications. Patient has following non-modifiable or demographic risk factors for suicide: separation or divorce Patient has the following protective factors against suicide: Supportive family and Pets in the home  Thank you for this consult request. Recommendations have been communicated to the primary team.  We will follow up at this time.   Nanine Means, NP       History of Present Illness  Relevant Aspects of [facility] Hospital Course:  Admitted on 06/22/2023 after she was found unconscious after  overdosing on Valium. Patient is 78 year old female with medical history significant of chronic pain, hyperlipidemia, hypothyroidism, tobacco abuse, osteopenia, anxiety, depression, kidney stone who was brought in by EMS after she was found to be unresponsive by family member. She was apparently vomiting as well. She had taken 8 to 10 tablets of Valium 5 mg pills because she could not sleep. She denied suicidal intent. Workup in the emergency department raised concern for aspiration pneumonia and also revealed masses in the lung.She is being worked up for now.   Patient Report:  Per Dr Dairl Ponder on 06/24/2023: Patient seen face to face in her hospital room. She is alert and oriented x 4, calm and cooperative. Patient is hard of hearing, needs to be stay close to her and speaks very loudly before she could hear. She reports feeling depressed and hopeless due to multiple psychosocial stressors and ongoing medical issues as evidenced by being hard of hearing, going blind, inability to drive and unable to go to the [employer]. Patient indicates she took extra pills (10 pills) of Valium because she was anxious and unable to sleep. She denies suicide intent. However, patient indicates she is 78 years old and does not care if she dies. She is unable to contract for safety. She reports ongoing depressed mood, lack of motivation, low energy level, fatigue, hopelessness and helplessness. Patient also reports feeling apprehensive, nervous, restless and irritable. She reports being prescribed Valium 5 mg daily for her anxiety. She also reports taking Lamotrigine and Zoloft for depression, however, patient states  the medications are not working. 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   Patient reports two of her siblings visited her earlier  today but refused this writer to talk to them saying "I need to know what is going on with me before I can allow you to talk to anyone. I don't know if I have cancer or not." Patient does not currently have a psychiatrist and is interested in one upon discharge from the hospital.   06/25/2023:  Yolanda Reeves was seen face to face in her hospital room today during rounds. She remains pleasant, interactive, and cooperative. Today, Yolanda Reeves reported "Do you have a magic wand to make me feel better". When asked to elaborate, she explained she was was having trouble managing her pain levels. Her depression was "not as bad as it has been", moderate level, and denied any suicidal thoughts. She still "worries a lot", but denies any panic attacks. Her sleep and appetite were both "good." When asked about auditory and visual hallucinations, Yolanda Reeves reported she was diagnosed with Maureen Ralphs Syndrome. She stated due to losing her eyesight, she has experienced hallucinations such as her couch being on fire in her home and a blue shirt hanging on a tree. She copes with these, she looks away blinks a couple of times and looks back and they are gone. This is a chronic issue for her associated with her loss of vision. She endorsed missing her 2 dogs at home, but reported her neighbors were helping take care of them. She did indicate she lived independently prior to coming to the hospital. When asked about medications, Yolanda Reeves reported taking Lamictal, Valium and one other medication that she could not remember. She discussed she was diagnosed in the past with bipolar, but she stated "I think they just got tired of trying to figure out what was wrong with me and just said I had that."  Denies mania symptoms, past and present. She also stated "I looked up the symptoms of bipolar and I have none of those, the highs, lows, any of that".  Her sister came into her room near the end of the assessment, supportive and appeared to have a good  relationship.  Based on the severity of her actions prior to admission and her passive suicidal ideation, psych admission continues to be warranted.  Today, Yolanda Reeves reported her main concern was getting her pain under control.   Psych ROS:  Depression: depressed mood, lack of motivation, low energy level, fatigue, hopelessness, helplessness, poor sleep, suicide ideations.  Anxiety: excessive anxiety, irritability, apprehensive, and nervousness.  Mania (lifetime and current): denies  Psychosis: (lifetime and current): occasional visual hallucinations but denies auditory hallucinations and delusions.   Collateral information:  Patient refused to give permission to contact family members or her daughter.   Review of Systems  Psychiatric/Behavioral:  Positive for depression. The patient is nervous/anxious.   All other systems reviewed and are negative.    Psychiatric and Social History  Psychiatric History:  Information collected from the patient   Prev Dx/Sx: Major depression and Anxiety Current Psych Provider: None Home Meds (current): Zoloft 150 mg daily, Lamotrigine 150 mg daily and Valium 5 mg daily for anxiety.  Previous Med Trials: None  Therapy: denies   Prior Psych Hospitalization: denies   Prior Self Harm: denies Prior Violence: denies   Family Psych History: denies  Family Hx suicide: denies   Social History:  Educational Hx: high school graduate Occupational Hx: unemployed, retired.  Legal Hx: denies  Living Situation: lives alone in her apartment  Spiritual Hx: unknown  Access to weapons/lethal means: denies    Substance History Alcohol: denies   Type of alcohol former smoker, but currently vape Nicotine.  Last Drink unknown  Number of drinks per day N/A History of alcohol withdrawal seizures denies  History of DT's denies  Tobacco: former smoker  Illicit drugs: denies  Prescription drug abuse: denies  Rehab hx: denies   Exam Findings  Physical  Exam: General appearance: Awake alert.  In no distress Resp: Coarse breath sounds with few crackles at the bases.  Occasional scattered wheezing.  No rhonchi. Cardio: S1-S2 is normal regular.  No S3-S4.  No rubs murmurs or bruit GI: Abdomen is soft.  Nontender nondistended.  Bowel sounds are present normal.  No masses organomegaly Extremities: No edema.  Some physical deconditioning is noted Neurologic: Alert and oriented x3.  No focal neurological deficits.   Vital Signs:  Temp:  [97.9 F (36.6 C)-98.4 F (36.9 C)] 98 F (36.7 C) (01/19 0546) Pulse Rate:  [56-66] 59 (01/19 0546) Resp:  [18-21] 18 (01/19 0546) BP: (134-144)/(75-96) 144/96 (01/19 0546) SpO2:  [95 %-96 %] 95 % (01/19 0546) Blood pressure (!) 144/96, pulse (!) 59, temperature 98 F (36.7 C), resp. rate 18, height 5\' 2"  (1.575 m), weight 92.9 kg, SpO2 95%. Body mass index is 37.48 kg/m.  Physical Exam Vitals and nursing note reviewed.  Constitutional:      Appearance: Normal appearance.  HENT:     Head: Normocephalic.     Nose: Nose normal.  Pulmonary:     Effort: Pulmonary effort is normal.  Musculoskeletal:     Cervical back: Normal range of motion.  Neurological:     General: No focal deficit present.     Mental Status: She is alert and oriented to person, place, and time.     Mental Status Exam: General Appearance: Casual and Well Groomed  Orientation:  Full (Time, Place, and Person)  Memory:  Recent;   Good  Concentration:  Concentration: Good  Recall:  Fair  Attention  Good  Eye Contact:  Fair  Speech:  Normal Rate  Language:  Good  Volume:  Decreased  Mood: "Better than it has been", moderate depression  Affect:  Congruent  Thought Process:  Linear  Thought Content:  Logical  Suicidal Thoughts:  No  Homicidal Thoughts:  No  Judgement:  Poor  Insight:  Lacking  Psychomotor Activity:  Decreased  Akathisia:  No  Fund of Knowledge:  Good      Assets:  Communication Skills Social Support   Cognition:  WNL  ADL's:  Impaired  AIMS (if indicated):        Other History   These have been pulled in through the EMR, reviewed, and updated if appropriate.  Family History:  The patient's family history includes Cancer in her brother; Colon cancer in her paternal grandmother; Prostate cancer in her brother; Skin cancer in her sister; Thyroid cancer in her brother.  Medical History: Past Medical History:  Diagnosis Date   Best dystrophy 2012   Chest pain, atypical    Heart murmur    HYPERLIPIDEMIA    HYPOTHYROIDISM, POST-RADIOACTIVE IODINE    OSTEOPENIA    Sleep apnea    TOBACCO USE    UNSPECIFIED SUBJECTIVE VISUAL DISTURBANCE    VITAMIN D DEFICIENCY  WART, RIGHT HAND     Surgical History: Past Surgical History:  Procedure Laterality Date   ABDOMINAL HYSTERECTOMY     CARPAL TUNNEL RELEASE     EXTRACORPOREAL SHOCK WAVE LITHOTRIPSY Right 10/15/2020   Procedure: EXTRACORPOREAL SHOCK WAVE LITHOTRIPSY (ESWL);  Surgeon: Alfredo Martinez, MD;  [facility];  Service: Urology;  Laterality: Right;   EXTRACORPOREAL SHOCK WAVE LITHOTRIPSY Right 11/19/2020   Procedure: EXTRACORPOREAL SHOCK WAVE LITHOTRIPSY (ESWL);  Surgeon: Jerilee Field, MD;  Location: [facility];  Service: Urology;  Laterality: Right;   EYE SURGERY     LAPAROSCOPIC OOPHERECTOMY     LITHOTRIPSY     TONSILLECTOMY       Medications:   Current Facility-Administered Medications:    acetaminophen (TYLENOL) tablet 650 mg, 650 mg, Oral, Q6H PRN, 650 mg at 06/24/23 2151 **OR** acetaminophen (TYLENOL) suppository 650 mg, 650 mg, Rectal, Q6H PRN, Doutova, Anastassia, MD   albuterol (PROVENTIL) (2.5 MG/3ML) 0.083% nebulizer solution 2.5 mg, 2.5 mg, Nebulization, Q2H PRN, Doutova, Anastassia, MD, 2.5 mg at 06/22/23 2350   amoxicillin-clavulanate (AUGMENTIN) 875-125 MG per tablet 1 tablet, 1 tablet, Oral, Q12H, Osvaldo Shipper, MD   busPIRone (BUSPAR) tablet 5 mg, 5 mg,  Oral, TID, Osvaldo Shipper, MD, 5 mg at 06/24/23 2151   guaiFENesin (MUCINEX) 12 hr tablet 600 mg, 600 mg, Oral, BID, Doutova, Anastassia, MD, 600 mg at 06/25/23 1914   ibuprofen (ADVIL) tablet 600 mg, 600 mg, Oral, Q6H PRN, Melody Comas B, MD, 600 mg at 06/25/23 0232   lamoTRIgine (LAMICTAL) tablet 150 mg, 150 mg, Oral, Daily, Osvaldo Shipper, MD, 150 mg at 06/25/23 7829   levothyroxine (SYNTHROID) tablet 150 mcg, 150 mcg, Oral, QAC breakfast, Doutova, Anastassia, MD, 150 mcg at 06/25/23 0533   nicotine (NICODERM CQ - dosed in mg/24 hours) patch 14 mg, 14 mg, Transdermal, Daily, Osvaldo Shipper, MD   pravastatin (PRAVACHOL) tablet 40 mg, 40 mg, Oral, q1800, Doutova, Anastassia, MD, 40 mg at 06/24/23 1843   predniSONE (DELTASONE) tablet 40 mg, 40 mg, Oral, Q breakfast, Osvaldo Shipper, MD  Allergies: No Known Allergies  Nanine Means, NP

## 2023-06-26 DIAGNOSIS — R911 Solitary pulmonary nodule: Secondary | ICD-10-CM | POA: Diagnosis not present

## 2023-06-26 DIAGNOSIS — Z515 Encounter for palliative care: Secondary | ICD-10-CM | POA: Diagnosis not present

## 2023-06-26 DIAGNOSIS — J690 Pneumonitis due to inhalation of food and vomit: Secondary | ICD-10-CM | POA: Diagnosis not present

## 2023-06-26 DIAGNOSIS — Z7189 Other specified counseling: Secondary | ICD-10-CM | POA: Diagnosis not present

## 2023-06-26 DIAGNOSIS — R918 Other nonspecific abnormal finding of lung field: Secondary | ICD-10-CM | POA: Diagnosis not present

## 2023-06-26 DIAGNOSIS — T424X1A Poisoning by benzodiazepines, accidental (unintentional), initial encounter: Secondary | ICD-10-CM | POA: Diagnosis not present

## 2023-06-26 LAB — LEGIONELLA PNEUMOPHILA SEROGP 1 UR AG: L. pneumophila Serogp 1 Ur Ag: NEGATIVE

## 2023-06-26 MED ORDER — SENNOSIDES-DOCUSATE SODIUM 8.6-50 MG PO TABS
2.0000 | ORAL_TABLET | Freq: Two times a day (BID) | ORAL | Status: DC
Start: 2023-06-26 — End: 2023-07-10
  Administered 2023-06-26 – 2023-07-10 (×26): 2 via ORAL
  Filled 2023-06-26 (×28): qty 2

## 2023-06-26 MED ORDER — OXYCODONE HCL 5 MG PO TABS
5.0000 mg | ORAL_TABLET | Freq: Four times a day (QID) | ORAL | Status: DC | PRN
  Administered 2023-06-26 – 2023-06-27 (×5): 5 mg via ORAL
  Filled 2023-06-26 (×6): qty 1

## 2023-06-26 MED ORDER — AMLODIPINE BESYLATE 5 MG PO TABS
5.0000 mg | ORAL_TABLET | Freq: Every day | ORAL | Status: DC
  Administered 2023-06-26 – 2023-07-05 (×10): 5 mg via ORAL
  Filled 2023-06-26 (×9): qty 1

## 2023-06-26 MED ORDER — POLYETHYLENE GLYCOL 3350 17 G PO PACK
17.0000 g | PACK | Freq: Every day | ORAL | Status: DC | PRN
  Administered 2023-06-26 – 2023-07-08 (×3): 17 g via ORAL
  Filled 2023-06-26 (×3): qty 1

## 2023-06-26 NOTE — Progress Notes (Signed)
NAME:  Yolanda Reeves, MRN:  [id number], DOB:  02-Apr-1946, LOS: 1 ADMISSION DATE:  06/22/2023, CONSULTATION DATE:  06/23/23  REFERRING MD:  dr Rito Ehrlich, CHIEF COMPLAINT:  Lung mass   History of Present Illness:  78 year old female with past medical history as below, which is significant for hyperlipidemia, hypothyroidism, chronic pain, deaf, blind, and anxiety. She was in her usual state of health until 1/17 when she was found to be unresponsive by family members and EMS was called. She was also found to have vomited. Upon arrival to the emergency department she was somewhat more lucid and admitted to taking 10 tablets of Valium because she has had significant anxiety recently and was attempting to sleep. She reports taking them over period of hours when she did not appreciate the desired effect of the previous doses. She does not recall being brought to the emergency department. Denied suicidal ideation. Workup in the emergency department was concerning for aspiration pneumonia and chest x-ray demonstrated possible lung mass in the right upper lobe. CT angiogram of the chest was negative for pulmonary embolism, but did describe a large left apical lung mass measuring 6.2 cm in addition to a spiculated solid mass in the right upper lobe measuring 2.4 cm and additional left-sided pulmonary nodules. Enlarged left hilar lymph nodes also noted. IR was consulted for possible biopsy of the left apical lung mass, however, they recommended pulmonary consultation due to poor target for percutaneous biopsy. PCCM's been asked to see.   Pertinent  Medical History   Past Medical History:  Diagnosis Date   Best dystrophy 2012   Chest pain, atypical    Heart murmur    HYPERLIPIDEMIA    HYPOTHYROIDISM, POST-RADIOACTIVE IODINE    OSTEOPENIA    Sleep apnea    TOBACCO USE    UNSPECIFIED SUBJECTIVE VISUAL DISTURBANCE    VITAMIN D DEFICIENCY    WART, RIGHT HAND    Significant Hospital Events: Including  procedures, antibiotic start and stop dates in addition to other pertinent events   CT chest noted MRI abdomen 06/25/2023 noted  Interim History / Subjective:  Denies any significant complaints at present  Objective   Blood pressure (!) 186/98, pulse (!) 57, temperature 98 F (36.7 C), temperature source Oral, resp. rate 14, height 5\' 2"  (1.575 m), weight 92.9 kg, SpO2 93%.        Intake/Output Summary (Last 24 hours) at 06/26/2023 1030 Last data filed at 06/26/2023 0900 Gross per 24 hour  Intake 480 ml  Output --  Net 480 ml   Filed Weights   06/22/23 1752 06/23/23 1827  Weight: 98.4 kg 92.9 kg    Examination: General: Elderly, does not appear to be in distress, poor vision, hard of hearing HENT: Normocephalic, atraumatic Lungs: Clear bilateral breath sounds Cardiovascular: S1-S2 appreciated Abdomen: Soft, bowel sounds appreciated Extremities: No acute deformity Neuro: Awake alert oriented x 3 GU:   Resolved Hospital Problem list     Assessment & Plan:  Large left upper lobe mass lesion abutting the mediastinum Spiculated right upper lobe with smaller mass, other nodules noted 50-pack-year smoking history  With MRI showing some evidence of liver metastasis -IR consulted for possible biopsy  If IR unable to biopsy, will require navigational bronchoscopy for left upper lobe lesion and EBUS for the adenopathy -This may require outpatient scheduling  Unintentional overdose with benzodiazepine  She is hard of hearing, blind  Did discuss with her at length, options going forward  Virl Diamond, MD [place] P[facility]M  Pager: See Loretha Stapler

## 2023-06-26 NOTE — TOC Initial Note (Addendum)
Transition of Care [facility]) - Initial/Assessment Note    Patient Details  Name: Yolanda Reeves MRN: [id number] Date of Birth: 1945-11-14  Transition of Care [facility]) CM/SW Contact:    Larrie Kass, LCSW Phone Number: 06/26/2023, 11:52 AM  Clinical Narrative:                 CSW met with the pt at the bedside to discuss home health recommendations. The pt has agreed with no preference for a home health company. CSW discussed SDOH transportation needs. The pt reports she may need transportation assistance at the time of discharge if she is unable to contact family. Pt will need HH orders placed MD made aware. TOC to follow.    Adden  12:20pm  Iantha Fallen has accepted pt for [facility] PT/OT. TOC to folllow for d/c needs.   Expected Discharge Plan: Home w Home Health Services Barriers to Discharge: Continued Medical Work up   Patient Goals and CMS Choice Patient states their goals for this hospitalization and ongoing recovery are:: retrun home CMS Medicare.gov Compare Post Acute Care list provided to:: Patient        Expected Discharge Plan and Services In-house Referral: Clinical Social Work     Living arrangements for the past 2 months: Single Family Home                                      Prior Living Arrangements/Services Living arrangements for the past 2 months: Single Family Home Lives with:: Self Patient language and need for interpreter reviewed:: Yes Do you feel safe going back to the place where you live?: Yes      Need for Family Participation in Patient Care: No (Comment) Care giver support system in place?: No (comment)   Criminal Activity/Legal Involvement Pertinent to Current Situation/Hospitalization: No - Comment as needed  Activities of Daily Living   ADL Screening (condition at time of admission) Independently performs ADLs?: No Does the patient have a NEW difficulty with bathing/dressing/toileting/self-feeding that is expected to last >3  days?: No Does the patient have a NEW difficulty with getting in/out of bed, walking, or climbing stairs that is expected to last >3 days?: No Does the patient have a NEW difficulty with communication that is expected to last >3 days?: No Is the patient deaf or have difficulty hearing?: Yes Does the patient have difficulty seeing, even when wearing glasses/contacts?: Yes Does the patient have difficulty concentrating, remembering, or making decisions?: No  Permission Sought/Granted                  Emotional Assessment Appearance:: Appears stated age Attitude/Demeanor/Rapport: Gracious Affect (typically observed): Accepting Orientation: : Oriented to Self, Oriented to Place, Oriented to Situation, Oriented to  Time   Psych Involvement: No (comment)  Admission diagnosis:  Benzodiazepine overdose, accidental or unintentional, initial encounter [T42.4X1A] Aspiration into airway, initial encounter [T17.908A] Lung mass [R91.8] Patient Active Problem List   Diagnosis Date Noted   Severe episode of recurrent major depressive disorder, without psychotic features (HCC) 06/24/2023   Lung mass 06/23/2023   COPD with acute exacerbation (HCC) 06/23/2023   Benzodiazepine overdose, accidental or unintentional, initial encounter 06/22/2023   Aspiration pneumonia (HCC) 06/22/2023   Prolonged QT interval 06/22/2023   Nocturnal hypoxemia 08/10/2015   Obesity (BMI 30-39.9) 05/19/2013   Elevated BP 08/31/2012   Routine general medical examination at a health care facility 08/30/2012  Chest pain, atypical 04/02/2012   HYPERLIPIDEMIA 01/16/2009   VITAMIN D DEFICIENCY 01/05/2009   UNSPECIFIED SUBJECTIVE VISUAL DISTURBANCE 01/05/2009   WART, RIGHT HAND 04/02/2008   HYPOTHYROIDISM, POST-RADIOACTIVE IODINE 04/02/2008   TOBACCO USE 04/02/2008   OSTEOPENIA 04/02/2008   PCP:  Pearline Cables, MD Pharmacy:   [facility] [phone] - [place], [place] - [address] AT [facility] [address] [place] [address] Phone: [phone] Fax: [phone]  [facility]) - [place], [place] - [address] [facility] [address] [place] [address] [place] [address] Phone: [phone] Fax: [phone]  [facility] #[id number] [place], [place] - [address] AT [facility] OF [facility] & [address] [place] [address] Phone: [phone] Fax: [phone]  [facility] #[id number] [place], [place] - [address] AT [facility] OF [address] & [facility] [address] [place] [address] Phone: [phone] Fax: [phone]  [facility] - [place], [place] - [address] [place] [address] Phone: [phone] Fax: [phone]     Social Drivers of Health (SDOH) Social History: SDOH Screenings   Food Insecurity: No Food Insecurity (06/23/2023)  Housing: Low Risk  (06/23/2023)  Transportation Needs: Unmet Transportation Needs (06/23/2023)  Utilities: Not At Risk (06/23/2023)  Alcohol Screen: Low Risk  (10/25/2021)  Depression (PHQ2-9): Low Risk  (10/25/2021)  Financial Resource Strain: Low Risk  (10/25/2021)  Physical Activity: Insufficiently Active (10/25/2021)  Social Connections: Moderately Isolated (06/23/2023)  Stress: Stress Concern Present (10/25/2021)  Tobacco Use: Medium Risk (06/25/2023)   SDOH Interventions:     Readmission Risk Interventions     No data to display

## 2023-06-26 NOTE — H&P (Signed)
Chief Complaint: Patient was seen in consultation today for liver lesion biopsy   Referring Provider(s): Dr. Heinz Knuckles, MD  Supervising Physician: Richarda Overlie  Patient Status: [facility] - In-pt  Patient is Full Code  History of Present Illness: Yolanda Reeves is a 78 y.o. female with PMHx of chronic pain, HTN, hypothyroidism, tobacco abuse, osteopenia, anxiety/depression, and kidney stones.  Per Dr. Benjie Karvonen progress note on 1/20: Patient was brought in on 1/16 by EMS after she was found to be unresponsive by family member. She was apparently vomiting as well. She had taken 8 to 10 tablets of Valium because she could not sleep. She denied suicidal intent. Workup in the emergency department raised concern for aspiration pneumonia and also revealed masses in the lung. She was hospitalized for further management.   She underwent a CT angiogram which was negative for PE but showed a large left apical lung mass measuring up to 6.2 cm.  Additional spiculated solid mass in the right upper lobe measuring 2.4 cm.  Pulmonary nodules on the left side were also noted.  Enlarged left hilar and AP window lymph nodes were also noted.  She is a smoker.  Used to smoke cigarettes and now she vapes.  CT of the abdomen pelvis raises concern for metastatic process in the liver and elsewhere.  Pulmonology suggested biopsying one of the liver lesions.  IR wants to better characterize these lesions before attempting biopsy so MRI of the abdomen has been ordered.  MRI of the abdomen was done yesterday which showed multiple small liver lesions raising concern for metastatic disease.  Findings discussed with IR who will evaluate patient for biopsy.  Interventional Radiology was requested for liver lesion biopsy. Request was reviewed and approved by Dr. Lowella Dandy. Patient is scheduled for same in IR tomorrow.  All labs and medications are within acceptable parameters. No pertinent allergies. Patient has been sipping on  black coffee since a full breakfast.    Currently without any significant complaints. Patient alert and laying in chair, calm. Patient is blind. She denies any fevers, headache, chest pain, SOB, cough, abdominal pain, nausea, vomiting or bleeding.     Past Medical History:  Diagnosis Date   Best dystrophy 2012   Chest pain, atypical    Heart murmur    HYPERLIPIDEMIA    HYPOTHYROIDISM, POST-RADIOACTIVE IODINE    OSTEOPENIA    Sleep apnea    TOBACCO USE    UNSPECIFIED SUBJECTIVE VISUAL DISTURBANCE    VITAMIN D DEFICIENCY    WART, RIGHT HAND     Past Surgical History:  Procedure Laterality Date   ABDOMINAL HYSTERECTOMY     CARPAL TUNNEL RELEASE     EXTRACORPOREAL SHOCK WAVE LITHOTRIPSY Right 10/15/2020   Procedure: EXTRACORPOREAL SHOCK WAVE LITHOTRIPSY (ESWL);  Surgeon: Alfredo Martinez, MD;  Location: [facility];  Service: Urology;  Laterality: Right;   EXTRACORPOREAL SHOCK WAVE LITHOTRIPSY Right 11/19/2020   Procedure: EXTRACORPOREAL SHOCK WAVE LITHOTRIPSY (ESWL);  Surgeon: Jerilee Field, MD;  Location: [facility];  Service: Urology;  Laterality: Right;   EYE SURGERY     LAPAROSCOPIC OOPHERECTOMY     LITHOTRIPSY     TONSILLECTOMY      Allergies: Patient has no known allergies.  Medications: Prior to Admission medications   Medication Sig Start Date End Date Taking? Authorizing Provider  albuterol (VENTOLIN HFA) 108 (90 Base) MCG/ACT inhaler INHALE 2 PUFFS INTO THE LUNGS EVERY 6 HOURS AS NEEDED FOR WHEEZING OR SHORTNESS OF  BREATH Patient taking differently: Inhale 2 puffs into the lungs every 6 (six) hours as needed for wheezing or shortness of breath. 05/10/21  Yes Copland, Gwenlyn Found, MD  lamoTRIgine (LAMICTAL) 150 MG tablet TAKE 1 TABLET BY MOUTH  DAILY Patient taking differently: Take 150 mg by mouth daily. 06/30/20  Yes Bradd Canary, MD  levothyroxine (SYNTHROID) 125 MCG tablet Take 1 tablet (125 mcg total) by mouth daily before  breakfast. Needs appointment with PCP and lab work 12/16/22  Yes Copland, Gwenlyn Found, MD  lovastatin (MEVACOR) 20 MG tablet Take 1 tablet (20 mg total) by mouth at bedtime. NEEDS OFFICE VISIT FOR ADDITIONAL REFILLS 04/19/22  Yes Copland, Gwenlyn Found, MD  ondansetron (ZOFRAN ODT) 4 MG disintegrating tablet Take 1 tablet (4 mg total) by mouth every 8 (eight) hours as needed for nausea or vomiting. 10/05/20  Yes Wynetta Fines, MD  sertraline (ZOLOFT) 100 MG tablet TAKE 1 AND 1/2 TABLETS BY  MOUTH DAILY Patient taking differently: Take 150 mg by mouth daily. 06/30/20  Yes Bradd Canary, MD  diazepam (VALIUM) 5 MG tablet TAKE 1/2 TO 1 TABLET BY  MOUTH DAILY AS NEEDED FOR  ANXIETY Patient not taking: Reported on 06/23/2023 09/18/19   Copland, Gwenlyn Found, MD  ergocalciferol (VITAMIN D2) 50000 units capsule Take 1 capsule (50,000 Units total) by mouth once a week. Reported on 08/05/2015 Patient not taking: Reported on 10/25/2021 09/25/16   Copland, Gwenlyn Found, MD  Omega-3 Fatty Acids (FISH OIL) 500 MG CAPS Take 500 mg by mouth daily.    [provider]     Family History  Problem Relation Age of Onset   Cancer Brother        thyroid   Prostate cancer Brother    Skin cancer Sister    Colon cancer Paternal Grandmother    Thyroid cancer Brother     Social History   Socioeconomic History   Marital status: Divorced    Spouse name: Not on file   Number of children: Not on file   Years of education: 14   Highest education level: Not on file  Occupational History   Not on file  Tobacco Use   Smoking status: Former    Current packs/day: 0.00    Average packs/day: 1 pack/day for 50.0 years (50.0 ttl pk-yrs)    Types: Cigarettes    Start date: 02/04/1961    Quit date: 02/05/2011    Years since quitting: 12.3    Passive exposure: Past   Smokeless tobacco: Never  Substance and Sexual Activity   Alcohol use: Yes    Alcohol/week: 0.0 standard drinks of alcohol    Comment: rarely   Drug use: No    Sexual activity: Not Currently  Other Topics Concern   Not on file  Social History Narrative   Drinks 3-5 cups of caffeine daily.   Social Drivers of Corporate investment banker Strain: Low Risk  (10/25/2021)   Overall Financial Resource Strain (CARDIA)    Difficulty of Paying Living Expenses: Not very hard  Food Insecurity: No Food Insecurity (06/23/2023)   Hunger Vital Sign    Worried About Running Out of Food in the Last Year: Never true    Ran Out of Food in the Last Year: Never true  Transportation Needs: Unmet Transportation Needs (06/23/2023)   PRAPARE - Transportation    Lack of Transportation (Medical): Yes    Lack of Transportation (Non-Medical): Yes  Physical Activity: Insufficiently Active (10/25/2021)  Exercise Vital Sign    Days of Exercise per Week: 4 days    Minutes of Exercise per Session: 30 min  Stress: Stress Concern Present (10/25/2021)   [employer] of Occupational Health - Occupational Stress Questionnaire    Feeling of Stress : To some extent  Social Connections: Moderately Isolated (06/23/2023)   Social Connection and Isolation Panel [NHANES]    Frequency of Communication with Friends and Family: More than three times a week    Frequency of Social Gatherings with Friends and Family: More than three times a week    Attends Religious Services: 1 to 4 times per year    Active Member of [employer] or Organizations: No    Attends Banker Meetings: Never    Marital Status: Divorced     Review of Systems: A 12 point ROS discussed and pertinent positives are indicated in the HPI above.  All other systems are negative.  Review of Systems  Constitutional:  Negative for activity change, fatigue and fever.  Respiratory:  Negative for chest tightness, shortness of breath and wheezing.   Cardiovascular:  Negative for chest pain.  Musculoskeletal:  Positive for back pain.  Skin:  Negative for color change.  Psychiatric/Behavioral:  Negative for  behavioral problems and confusion.     Vital Signs: BP (!) 186/98 (BP Location: Right Arm)   Pulse (!) 57   Temp 98 F (36.7 C) (Oral)   Resp 14   Ht 5\' 2"  (1.575 m)   Wt 204 lb 14.4 oz (92.9 kg)   SpO2 93%   BMI 37.48 kg/m   Advance Care Plan: The advanced care place/surrogate decision maker was discussed at the time of visit and the patient did not wish to discuss or was not able to name a surrogate decision maker or provide an advance care plan.  Physical Exam Constitutional:      General: She is not in acute distress.    Appearance: Normal appearance.  HENT:     Mouth/Throat:     Mouth: Mucous membranes are moist.  Cardiovascular:     Rate and Rhythm: Normal rate and regular rhythm.     Heart sounds: No murmur heard. Pulmonary:     Effort: Pulmonary effort is normal.     Breath sounds: Normal breath sounds. No wheezing.  Musculoskeletal:        General: Normal range of motion.  Skin:    General: Skin is warm and dry.  Neurological:     Mental Status: She is alert and oriented to person, place, and time.  Psychiatric:        Mood and Affect: Mood normal.        Behavior: Behavior normal.        Thought Content: Thought content normal.        Judgment: Judgment normal.     Imaging: MR ABDOMEN W WO CONTRAST Result Date: 06/26/2023 CLINICAL DATA:  Lung cancer. Multiple liver lesions on staging abdomen/pelvis CT. EXAM: MRI ABDOMEN WITHOUT AND WITH CONTRAST TECHNIQUE: Multiplanar multisequence MR imaging of the abdomen was performed both before and after the administration of intravenous contrast. CONTRAST:  9mL GADAVIST GADOBUTROL 1 MMOL/ML IV SOLN COMPARISON:  Abdomen and pelvis CT 06/23/2023 FINDINGS: Lower chest: Small left lower lobe pulmonary nodule better characterized on recent CT imaging. Hepatobiliary: Multiple small liver lesions are seen in both hepatic lobes. Index lesion lateral segment left liver measures 14 mm on axial T2 image 17 of series 4. Index lesion  in the medial right liver measures 8 mm on image 15/4. Lesions range in size from approximately 3-4 mm up to the dominant lateral segment left liver lesion at 14 mm. Lesions demonstrate restricted perfusion. Postcontrast imaging is markedly motion degraded which limits assessment. Dominant lesions in the left liver demonstrate subtle irregular rim enhancement (see left hepatic dome lesion on 33/17 and lateral segment left liver lesion on image 36/12). Gallbladder is nondistended with tiny gallstone evident. No gallbladder wall thickening or pericholecystic fluid. No intrahepatic or extrahepatic biliary dilation. Pancreas: No focal mass lesion. No dilatation of the main duct. No intraparenchymal cyst. No peripancreatic edema. Spleen:  No splenomegaly. No suspicious focal mass lesion. Adrenals/Urinary Tract: No right adrenal nodule or mass. 15 mm left adrenal nodule shows loss of signal intensity on out of phase T1 imaging consistent with adrenal adenoma. Small cortical cysts are noted in both kidneys. 2.9 x 2.8 cm heterogeneously enhancing lesion is identified in the lower pole right kidney, not well evaluated on postcontrast imaging due to substantial motion degradation. Imaging features concerning for renal cell carcinoma. A second similar 13 mm lesion is identified in the interpolar right kidney (axial 72/17) which may have ill-defined peripheral enhancement, raising concern for neoplasm. 10 mm interpolar left renal lesion on 68/17 is indeterminate as enhancement cannot be excluded on the 3 minute delayed imaging. Stomach/Bowel: Tiny hiatal hernia. Stomach otherwise unremarkable. Duodenum does not cross the midline as expected consistent with intestinal malrotation. No small bowel or colonic dilatation within the visualized abdomen. Vascular/Lymphatic: No abdominal aortic aneurysm. No abdominal lymphadenopathy. Other:  No intraperitoneal free fluid. Musculoskeletal: No focal suspicious marrow enhancement within  the visualized bony anatomy. IMPRESSION: 1. Multiple small liver lesions in both hepatic lobes, ranging in size from approximately 3-4 mm up to the dominant lateral segment left liver lesion at 14 mm. Although postcontrast imaging is markedly motion degraded, imaging features are concerning for metastatic disease. 2. 2.9 x 2.8 cm heterogeneously enhancing lesion in the lower pole right kidney, not well evaluated on postcontrast imaging due to substantial motion degradation. Imaging features concerning for renal cell carcinoma. A second similar 13 mm lesion is identified in the interpolar right kidney and a 10 mm interpolar left renal lesion are indeterminate as enhancement cannot be excluded on the 3 minute delayed imaging. Close follow-up recommended. 3. 15 mm left adrenal adenoma. 4. Cholelithiasis. 5. Tiny hiatal hernia. 6. Intestinal malrotation. Electronically Signed   By: Kennith Center M.D.   On: 06/26/2023 05:22   MR BRAIN W WO CONTRAST Result Date: 06/23/2023 CLINICAL DATA:  Metastatic disease evaluation.  Lung cancer. EXAM: MRI HEAD WITHOUT AND WITH CONTRAST TECHNIQUE: Multiplanar, multiecho pulse sequences of the brain and surrounding structures were obtained without and with intravenous contrast. CONTRAST:  10mL GADAVIST GADOBUTROL 1 MMOL/ML IV SOLN COMPARISON:  01/06/2009 FINDINGS: Brain: Diffusion imaging does not show any acute or subacute infarction or other cause of restricted diffusion. Brain shows atrophy with extensive chronic small-vessel ischemic changes affecting the pons and cerebral hemispheric white matter. No large vessel territory stroke. No evidence of mass lesion, hemorrhage, hydrocephalus or extra-axial collection. After contrast administration, no abnormal brain or leptomeningeal enhancement occurs. Vascular: Major vessels at the base of the brain show flow. Skull and upper cervical spine: Negative Sinuses/Orbits: Clear/normal Other: None IMPRESSION: No evidence of metastatic  disease. Atrophy with extensive chronic small-vessel ischemic changes of the pons and cerebral hemispheric white matter. Electronically Signed   By: Paulina Fusi M.D.   On: 06/23/2023  14:33   CT ABDOMEN PELVIS W CONTRAST Result Date: 06/23/2023 CLINICAL DATA:  Staging lung cancer.  * Tracking Code: BO * EXAM: CT ABDOMEN AND PELVIS WITH CONTRAST TECHNIQUE: Multidetector CT imaging of the abdomen and pelvis was performed using the standard protocol following bolus administration of intravenous contrast. RADIATION DOSE REDUCTION: This exam was performed according to the departmental dose-optimization program which includes automated exposure control, adjustment of the mA and/or kV according to patient size and/or use of iterative reconstruction technique. CONTRAST:  OMNIPAQUE IOHEXOL 300 MG/ML  SOLN COMPARISON:  Abdomen pelvis CT 10/05/2020. CT angiogram chest 06/22/2023 FINDINGS: Lower chest: Please correlate with CT of the chest from 06/22/2023. Again left lower lobe lung nodule seen on series 6, image 8. Pericardial effusion. Hepatobiliary: Multiple liver masses identified worrisome for metastatic disease. At least 7 lesions are identified. These small 1 of the larger foci seen anteriorly in segment 2 measuring 12 by 10 mm on series 2, image 12. Patent portal vein. Gallbladder is nondilated. Pancreas: Unremarkable. No pancreatic ductal dilatation or surrounding inflammatory changes. Spleen: T1 Adrenals/Urinary Tract: Right adrenal gland is slightly nodular. On series 2, image 25 this measures 13 by 11 mm. There is a left adrenal nodule as well measuring 16 by 10 mm. Indeterminate. There is contrast in the renal collecting systems related to the prior exam. Mild bilateral renal atrophy. There is a lower pole lesion on the right measuring on series 2, image 42 2.1 by 1.6 cm. This has Hounsfield units of 51 on portal venous phase in on delayed forty-three. This has with a lobular margins on coronal imaging and  is suspicious. There are some other lesions in each kidney which are also indeterminate based on appearance today. Not clearly cysts. Ureters have normal course and caliber down to the bladder. Preserved contours of the urinary bladder. Stomach/Bowel: Large bowel has scattered stool and diverticula. There are redundant course of the colon diffusely. The cecum actually resides in the anterior left midabdomen. Normal appendix extends caudal to the cecum. Small bowel is nondilated with several small bowel loops extending to the right side lateral to the course of the ascending colon. Stomach is nondilated. Small hiatal hernia. Vascular/Lymphatic: Aortic atherosclerosis. No enlarged abdominal or pelvic lymph nodes. Reproductive: Prostate is unremarkable. Other: No free air or free fluid. Musculoskeletal: Scattered degenerative changes of the spine and pelvis. Transitional lumbosacral segment. IMPRESSION: Multiple aggressive appearing liver lesions worrisome for metastatic disease. Renal lesions are also seen which are indeterminate. One lesion in particular along the lower pole of the right kidney has a aggressive features and could be primary lesion versus metastatic focus. Bilateral adrenal nodules are noted which are indeterminate. Overall with although these areas MRI may be of some benefit with and without contrast dynamic to further delineate the etiology of the structures. No bowel obstruction. Colonic diverticula. Atypical course of the colon with the cecum in the anterior left midabdomen. Please correlate with previous CT angiogram of the chest. Electronically Signed   By: Karen Kays M.D.   On: 06/23/2023 11:43   ECHOCARDIOGRAM COMPLETE Result Date: 06/23/2023    ECHOCARDIOGRAM REPORT   Patient Name:   MARYKAYE KUNZLER Date of Exam: 06/23/2023 Medical Rec #:  [id number]       Height:       61.0 in Accession #:    [id number]      Weight:       216.9 lb Date of Birth:  June 11, 1945  BSA:          1.955 m  Patient Age:    77 years        BP:           149/71 mmHg Patient Gender: F               HR:           57 bpm. Exam Location:  Inpatient Procedure: 2D Echo, Cardiac Doppler and Color Doppler Indications:    R94.31 Abnormal EKG  History:        Patient has no prior history of Echocardiogram examinations.                 COPD and HLD; Signs/Symptoms:Chest Pain.  Sonographer:    Webb Laws Referring Phys: [id number] ANASTASSIA DOUTOVA IMPRESSIONS  1. Left ventricular ejection fraction, by estimation, is 60 to 65%. The left ventricle has normal function. The left ventricle has no regional wall motion abnormalities. Left ventricular diastolic parameters are consistent with Grade I diastolic dysfunction (impaired relaxation). Elevated left ventricular end-diastolic pressure.  2. Right ventricular systolic function is normal. The right ventricular size is normal.  3. The mitral valve is abnormal. No evidence of mitral valve regurgitation. No evidence of mitral stenosis. Moderate mitral annular calcification.  4. The aortic valve is tricuspid. There is mild calcification of the aortic valve. There is mild thickening of the aortic valve. Aortic valve regurgitation is not visualized. Aortic valve sclerosis is present, with no evidence of aortic valve stenosis.  5. The inferior vena cava is dilated in size with >50% respiratory variability, suggesting right atrial pressure of 8 mmHg. FINDINGS  Left Ventricle: Left ventricular ejection fraction, by estimation, is 60 to 65%. The left ventricle has normal function. The left ventricle has no regional wall motion abnormalities. The left ventricular internal cavity size was normal in size. There is  no left ventricular hypertrophy. Left ventricular diastolic parameters are consistent with Grade I diastolic dysfunction (impaired relaxation). Elevated left ventricular end-diastolic pressure. Right Ventricle: The right ventricular size is normal. No increase in right ventricular wall  thickness. Right ventricular systolic function is normal. Left Atrium: Left atrial size was normal in size. Right Atrium: Right atrial size was normal in size. Pericardium: There is no evidence of pericardial effusion. Mitral Valve: The mitral valve is abnormal. There is mild thickening of the mitral valve leaflet(s). There is mild calcification of the mitral valve leaflet(s). Moderate mitral annular calcification. No evidence of mitral valve regurgitation. No evidence  of mitral valve stenosis. Tricuspid Valve: The tricuspid valve is normal in structure. Tricuspid valve regurgitation is not demonstrated. No evidence of tricuspid stenosis. Aortic Valve: The aortic valve is tricuspid. There is mild calcification of the aortic valve. There is mild thickening of the aortic valve. Aortic valve regurgitation is not visualized. Aortic valve sclerosis is present, with no evidence of aortic valve stenosis. Pulmonic Valve: The pulmonic valve was normal in structure. Pulmonic valve regurgitation is not visualized. No evidence of pulmonic stenosis. Aorta: The aortic root is normal in size and structure. Venous: The inferior vena cava is dilated in size with greater than 50% respiratory variability, suggesting right atrial pressure of 8 mmHg. IAS/Shunts: No atrial level shunt detected by color flow Doppler.  LEFT VENTRICLE PLAX 2D LVIDd:         4.20 cm     Diastology LVIDs:         2.90 cm     LV e' medial:  3.26 cm/s LV PW:         1.10 cm     LV E/e' medial:  18.7 LV IVS:        0.90 cm     LV e' lateral:   4.35 cm/s LVOT diam:     1.90 cm     LV E/e' lateral: 14.0 LV SV:         55 LV SV Index:   28 LVOT Area:     2.84 cm  LV Volumes (MOD) LV vol d, MOD A2C: 35.3 ml LV vol d, MOD A4C: 34.1 ml LV vol s, MOD A2C: 8.3 ml LV vol s, MOD A4C: 10.4 ml LV SV MOD A2C:     27.0 ml LV SV MOD A4C:     34.1 ml LV SV MOD BP:      25.7 ml RIGHT VENTRICLE             IVC RV Basal diam:  3.90 cm     IVC diam: 2.30 cm RV S prime:      10.10 cm/s TAPSE (M-mode): 2.5 cm LEFT ATRIUM             Index        RIGHT ATRIUM           Index LA diam:        2.80 cm 1.43 cm/m   RA Area:     13.90 cm LA Vol (A2C):   18.1 ml 9.26 ml/m   RA Volume:   33.10 ml  16.93 ml/m LA Vol (A4C):   20.2 ml 10.33 ml/m LA Biplane Vol: 20.0 ml 10.23 ml/m  AORTIC VALVE LVOT Vmax:   90.90 cm/s LVOT Vmean:  58.600 cm/s LVOT VTI:    0.195 m  AORTA Ao Root diam: 3.10 cm Ao Asc diam:  3.40 cm MITRAL VALVE               TRICUSPID VALVE MV Area (PHT): 2.69 cm    TR Peak grad:   13.1 mmHg MV Decel Time: 282 msec    TR Vmax:        181.00 cm/s MV E velocity: 60.80 cm/s MV A velocity: 97.30 cm/s  SHUNTS MV E/A ratio:  0.62        Systemic VTI:  0.20 m                            Systemic Diam: 1.90 cm Charlton Haws MD Electronically signed by Charlton Haws MD Signature Date/Time: 06/23/2023/9:51:44 AM    Final    CT Angio Chest Pulmonary Embolism (PE) W or WO Contrast Result Date: 06/22/2023 CLINICAL DATA:  Unresponsive vomiting EXAM: CT ANGIOGRAPHY CHEST WITH CONTRAST TECHNIQUE: Multidetector CT imaging of the chest was performed using the standard protocol during bolus administration of intravenous contrast. Multiplanar CT image reconstructions and MIPs were obtained to evaluate the vascular anatomy. RADIATION DOSE REDUCTION: This exam was performed according to the departmental dose-optimization program which includes automated exposure control, adjustment of the mA and/or kV according to patient size and/or use of iterative reconstruction technique. CONTRAST:  75mL OMNIPAQUE IOHEXOL 350 MG/ML SOLN COMPARISON:  06/22/2023 chest x-ray FINDINGS: Cardiovascular: Satisfactory opacification of the pulmonary arteries to the segmental level. No evidence of pulmonary embolism. Moderate aortic atherosclerosis. No aneurysm. Normal cardiac size. Small pericardial effusion. Coronary vascular calcification. Mediastinum/Nodes: Patent trachea. No suspicious thyroid mass. Enlarged AP window  lymph nodes measuring  up to 17 mm on series 4, image 39. Multiple enlarged left hilar nodes measuring up to 17 mm. Esophagus within normal limits. Small hiatal hernia Lungs/Pleura: No pleural effusion or pneumothorax. Large left apical lung mass measuring approximately 6.2 by 4.6 by 4.5 cm on series 4, image 22 and series 6, image 102. Additional spiculated right upper lobe solid mass measuring 2.4 by 1.7 by 1.6 cm on series 12, image 32 and coronal series 6, image 92. Additional left lower lobe pulmonary nodule measuring 6 mm on series 12, image 60 and 3 mm left upper lobe pulmonary nodule on series 12, image 43. Scattered hazy pulmonary densities could be due to atelectasis or small airways disease. Upper Abdomen: No acute finding Musculoskeletal: No acute or suspicious osseous abnormality. Review of the MIP images confirms the above findings. IMPRESSION: 1. Negative for acute pulmonary embolus. 2. Large left apical lung mass measuring up to 6.2 cm, concerning for lung carcinoma. Additional spiculated solid mass in the right upper lobe measuring 2.4 cm which may represent metastatic lesion or synchronous lung CA. Additional small left-sided predominant pulmonary nodules measuring up to 6 mm in the left lower lobe are indeterminate for metastatic disease. 3. Enlarged left hilar and AP window lymph nodes, suspicious for metastatic disease. 4. Aortic atherosclerosis. Aortic Atherosclerosis (ICD10-I70.0). Electronically Signed   By: Jasmine Pang M.D.   On: 06/22/2023 23:48   DG Chest Port 1 View Result Date: 06/22/2023 CLINICAL DATA:  Unresponsive with vomiting. EXAM: PORTABLE CHEST 1 VIEW COMPARISON:  None Available. FINDINGS: The heart size and mediastinal contours are within normal limits. Low lung volumes are noted. Mild atelectatic changes are seen within the bilateral lung bases. A small patchy area of scarring, atelectasis and/or infiltrate is seen within the periphery of the upper right lung. This is  increased in severity when compared to the prior study. Opacification of the medial aspect of the left apex is also noted. No pleural effusion or pneumothorax is identified. Multilevel degenerative changes are seen throughout the thoracic spine. IMPRESSION: 1. Low lung volumes with mild bibasilar atelectasis. 2. Small patchy area of scarring, atelectasis and/or infiltrate within the periphery of the upper right lung. 3. Opacification of the medial left apex which may represent an additional area of airspace disease. CT correlation is recommended to further exclude the presence of an underlying pulmonary or mediastinal mass. Electronically Signed   By: Aram Candela M.D.   On: 06/22/2023 20:08    Labs:  CBC: Recent Labs    06/22/23 1900 06/23/23 0443 06/24/23 0430  WBC 5.4 5.8 7.5  HGB 12.0 11.9* 11.2*  HCT 39.2 38.1 35.3*  PLT 181 170 194    COAGS: Recent Labs    06/22/23 2343  INR 1.0    BMP: Recent Labs    06/22/23 1900 06/23/23 0443 06/24/23 0430  NA 141 141 137  K 3.6 3.8 3.7  CL 107 108 105  CO2 26 24 23   GLUCOSE 90 106* 120*  BUN 16 13 16   CALCIUM 8.7* 8.7* 8.6*  CREATININE 1.09* 0.94 0.81  GFRNONAA 53* >60 >60    LIVER FUNCTION TESTS: Recent Labs    06/22/23 1900 06/23/23 0443  BILITOT 0.8 0.4  AST 14* 12*  ALT 9 8  ALKPHOS 58 61  PROT 6.7 6.8  ALBUMIN 3.2* 3.4*    TUMOR MARKERS: No results for input(s): "AFPTM", "CEA", "CA199", "CHROMGRNA" in the last 8760 hours.  Assessment and Plan: Per Dr. Benjie Karvonen progress note on 1/20: Patient  was brought in on 1/16 by EMS after she was found to be unresponsive by family member. She had taken 8 to 10 tablets of Valium because she could not sleep. She denied suicidal intent. She was hospitalized for further management.   She underwent a CT angiogram which was negative for PE but showed a large left apical lung mass measuring up to 6.2 cm.  Additional spiculated solid mass in the right upper lobe measuring  2.4 cm. She is a smoker.  Used to smoke cigarettes and now she vapes.  CT of the abdomen pelvis raises concern for metastatic process in the liver and elsewhere.  Pulmonology suggested biopsying one of the liver lesions.  IR wants to better characterize these lesions before attempting biopsy so MRI of the abdomen has been ordered.  MRI of the abdomen was done yesterday which showed multiple small liver lesions raising concern for metastatic disease.  Findings discussed with IR who will evaluate patient for biopsy.  Patient is scheduled for liver lesion biopsy in IR tomorrow.  Risks and benefits of liver lesion biopsy was discussed with the patient and/or patient's family including, but not limited to bleeding, infection, damage to adjacent structures or low yield requiring additional tests.  All of the questions were answered and there is agreement to proceed.  Consent signed and in chart.   Thank you for allowing our service to participate in MAYSA ESKILDSEN 's care.  Electronically Signed: Sable Feil, PA-C   06/26/2023, 10:43 AM      I spent a total of 40 Minutes    in face to face in clinical consultation, greater than 50% of which was counseling/coordinating care for liver lesion biopsy.

## 2023-06-26 NOTE — Progress Notes (Signed)
[facility] PROGRESS NOTE   Yolanda Reeves XNA:[id number] DOB: 10-18-1945 DOA: 06/22/2023  PCP: Pearline Cables, MD  Brief History: 78 y.o. female with medical history significant of chronic pain, hyperlipidemia hypothyroidism tobacco abuse osteopenia, and anxiety depression, kidney stone who was brought in by EMS after she was found to be unresponsive by family member.  She was apparently vomiting as well.  She had taken 8 to 10 tablets of Valium because she could not sleep.  She denied suicidal intent.  Workup in the emergency department raised concern for aspiration pneumonia and also revealed masses in the lung.  She was hospitalized for further management.  Consultants: Pulmonology.  Interventional radiology.  Palliative care  Procedures: Echocardiogram    Subjective/Interval History: Patient sitting on the side of the bed.  Denies any shortness of breath abdominal pain nausea vomiting.  Seems to be agreeable to undergoing biopsy.    Assessment/Plan:  Aspiration pneumonia with concern for acute bronchitis/history of COPD Likely as a result of the unintentional overdose.   Seen by speech therapy.  No needs identified. Antibiotics were changed to oral.  Steroids were changed to oral.  Respiratory status is stable. Not on oxygen currently.  Unintentional benzodiazepine overdose She took 8 to 10 tablets of Valium in order to go to sleep.  Denied any suicidal ideation.  Holding benzodiazepines for now.  She is now fully awake and alert. Not certain how she acquired the Valium.  No sedative or narcotic prescription data is noted on the prescriber database.  She has not been prescribed any of these medications recently, at least locally. Psychiatry was consulted by pulmonology.  Seen by psychiatry who made medication recommendations which included continuing lamotrigine and initiating mirtazapine at bedtime and buspirone 3 times a day.  EKG was repeated and continues to show  QT prolongation.  Will not start mirtazapine at this time.   Psychiatry also recommend inpatient psychiatric admission.  Will have psychiatry reassess once medical workup has been completed to see if she still would need inpatient psychiatric admission.  She denies any suicidal ideation at this time.  Lung mass/liver lesions concerning for metastatic process She underwent a CT angiogram which was negative for PE but showed a large left apical lung mass measuring up to 6.2 cm.  Additional spiculated solid mass in the right upper lobe measuring 2.4 cm.  Pulmonary nodules on the left side were also noted.  Enlarged left hilar and AP window lymph nodes were also noted.  She is a smoker.  Used to smoke cigarettes and now she vapes. CT of the abdomen pelvis raises concern for metastatic process in the liver and elsewhere.  Pulmonology suggested biopsying one of the liver lesions.  IR wants to better characterize these lesions before attempting biopsy so MRI of the abdomen has been ordered.  MRI of the abdomen was done yesterday which showed multiple small liver lesions raising concern for metastatic disease. Findings discussed with IR who will evaluate patient for biopsy.  Right kidney lesion 2.9 x 2.8 cm lesion noted in the right kidney raising concern for renal cell carcinoma.  Another lesion measuring 13 mm also noted in the interpolar right kidney and a 10 mm lesion in the left kidney was also noted.  This may also need further testing and/or urological input.  This can be pursued in the outpatient setting.  Incidental cholelithiasis Asymptomatic.  Left adrenal adenoma Will need outpatient monitoring.    Hypothyroidism Supposed to be on 125 mcg  of Synthroid on a daily basis. Her TSH was noted to be 66.5.  Free T4 noted to be 0.34.  Compliance is questionable.  Dose of levothyroxine was increased.   Thyroid function test will need to be rechecked in a few weeks.  Elevated blood pressure  readings Not noted to be on any antihypertensives currently.  Does not appear that she was any antihypertensives prior to admission either.  Will initiate amlodipine.  Hyperlipidemia Continue statin.  QT prolongation Monitor on telemetry.  Avoid QT prolonging medications.  Atypical chest pain Likely due to lung mass.  No ischemic changes on EKG.   Echocardiogram shows normal LVEF with grade 1 diastolic dysfunction.  No wall motion abnormalities.  No significant valvular disease noted. No further cardiac workup at this time.  Symptoms could be due to her lung mass.  Normocytic anemia There was dilutional drop in hemoglobin but stable for the most part.  Class III obesity Estimated body mass index is 37.48 kg/m as calculated from the following:   Height as of this encounter: 5\' 2"  (1.575 m).   Weight as of this encounter: 92.9 kg.  Goals of care Patient has been asking questions about the next steps if all of this turns out to be cancer.  She is not sure about wanting to undergo any treatments.  May benefit from a palliative care consultation for goals of care.  DVT Prophylaxis: SCDs only for now in case of procedure needs to be performed Code Status: Full code Family Communication: Discussed with the patient Disposition Plan: Start mobilizing.  Psychiatry recommends inpatient psychiatric admission when medically clear.     Medications: Scheduled:  amoxicillin-clavulanate  1 tablet Oral Q12H   busPIRone  5 mg Oral TID   guaiFENesin  600 mg Oral BID   lamoTRIgine  150 mg Oral Daily   levothyroxine  150 mcg Oral QAC breakfast   lidocaine  1 patch Transdermal Q24H   nicotine  14 mg Transdermal Daily   pravastatin  40 mg Oral q1800   predniSONE  40 mg Oral Q breakfast   Continuous:   QQV:[id number] **OR** acetaminophen, albuterol  Antibiotics: Anti-infectives (From admission, onward)    Start     Dose/Rate Route Frequency Ordered Stop   06/25/23 1200   amoxicillin-clavulanate (AUGMENTIN) 875-125 MG per tablet 1 tablet        1 tablet Oral Every 12 hours 06/25/23 0945 06/29/23 0959   06/23/23 1200  Ampicillin-Sulbactam (UNASYN) 3 g in sodium chloride 0.9 % 100 mL IVPB  Status:  Discontinued        3 g 200 mL/hr over 30 Minutes Intravenous Every 6 hours 06/23/23 1045 06/25/23 0945   06/23/23 0500  Ampicillin-Sulbactam (UNASYN) 3 g in sodium chloride 0.9 % 100 mL IVPB  Status:  Discontinued        3 g 200 mL/hr over 30 Minutes Intravenous Every 8 hours 06/22/23 2308 06/23/23 1045   06/22/23 2045  Ampicillin-Sulbactam (UNASYN) 3 g in sodium chloride 0.9 % 100 mL IVPB        3 g 200 mL/hr over 30 Minutes Intravenous  Once 06/22/23 2042 06/22/23 2147       Objective:  Vital Signs  Vitals:   06/24/23 2112 06/25/23 0546 06/25/23 2036 06/26/23 0503  BP: 134/75 (!) 144/96 (!) 146/99 (!) 186/98  Pulse: 66 (!) 59 72 (!) 57  Resp: (!) 21 18 17 14   Temp: 97.9 F (36.6 C) 98 F (36.7 C) 97.9 F (36.6 C) 98  F (36.7 C)  TempSrc: Oral  Oral Oral  SpO2: 96% 95% 93% 93%  Weight:      Height:        Intake/Output Summary (Last 24 hours) at 06/26/2023 0945 Last data filed at 06/26/2023 0900 Gross per 24 hour  Intake 480 ml  Output --  Net 480 ml   Filed Weights   06/22/23 1752 06/23/23 1827  Weight: 98.4 kg 92.9 kg    General appearance: Awake alert.  In no distress Resp: Clear to auscultation bilaterally.  Normal effort Cardio: S1-S2 is normal regular.  No S3-S4.  No rubs murmurs or bruit GI: Abdomen is soft.  Nontender nondistended.  Bowel sounds are present normal.  No masses organomegaly Extremities: No edema.  Full range of motion of lower extremities. Neurologic: Alert and oriented x3.  No focal neurological deficits.    Lab Results:  Data Reviewed: I have personally reviewed following labs and reports of the imaging studies  CBC: Recent Labs  Lab 06/22/23 1900 06/23/23 0443 06/24/23 0430  WBC 5.4 5.8 7.5  NEUTROABS  3.3  --   --   HGB 12.0 11.9* 11.2*  HCT 39.2 38.1 35.3*  MCV 95.6 96.7 94.4  PLT 181 170 194    Basic Metabolic Panel: Recent Labs  Lab 06/22/23 1900 06/23/23 0443 06/24/23 0430  NA 141 141 137  K 3.6 3.8 3.7  CL 107 108 105  CO2 26 24 23   GLUCOSE 90 106* 120*  BUN 16 13 16   CREATININE 1.09* 0.94 0.81  CALCIUM 8.7* 8.7* 8.6*  MG 2.5* 2.3  --   PHOS 3.8 3.6  --     GFR: Estimated Creatinine Clearance: 62.7 mL/min (by C-G formula based on SCr of 0.81 mg/dL).  Liver Function Tests: Recent Labs  Lab 06/22/23 1900 06/23/23 0443  AST 14* 12*  ALT 9 8  ALKPHOS 58 61  BILITOT 0.8 0.4  PROT 6.7 6.8  ALBUMIN 3.2* 3.4*    Coagulation Profile: Recent Labs  Lab 06/22/23 2343  INR 1.0    Cardiac Enzymes: Recent Labs  Lab 06/22/23 1900  CKTOTAL 54    CBG: Recent Labs  Lab 06/22/23 1756  GLUCAP 100*      Recent Results (from the past 240 hours)  Culture, blood (routine x 2) Call MD if unable to obtain prior to antibiotics being given     Status: None (Preliminary result)   Collection Time: 06/22/23 11:00 PM   Specimen: BLOOD RIGHT HAND  Result Value Ref Range Status   Specimen Description   Final    BLOOD RIGHT HAND Performed at [facility], [address], [place], [place] [address]    Special Requests   Final    BOTTLES DRAWN AEROBIC AND ANAEROBIC Blood Culture results may not be optimal due to an inadequate volume of blood received in culture bottles Performed at [facility], [address] [place], [place] [address]    Culture   Final    NO GROWTH 2 DAYS Performed at [facility], [address], [place], [place] [address]    Report Status PENDING  Incomplete  Culture, blood (routine x 2) Call MD if unable to obtain prior to antibiotics being given     Status: None (Preliminary result)   Collection Time: 06/22/23 11:15 PM   Specimen: BLOOD LEFT HAND  Result Value Ref Range Status   Specimen  Description   Final    BLOOD LEFT HAND  Performed at [facility], [address], [place], [place] [address]    Special Requests   Final    BOTTLES DRAWN AEROBIC AND ANAEROBIC Blood Culture results may not be optimal due to an inadequate volume of blood received in culture bottles Performed at [facility], [address], [place], [place] [address]    Culture   Final    NO GROWTH 2 DAYS Performed at [facility], [address] [place], [place] [address]    Report Status PENDING  Incomplete  Expectorated Sputum Assessment w Gram Stain, Rflx to Resp Cult     Status: None   Collection Time: 06/23/23  8:05 AM   Specimen: Expectorated Sputum  Result Value Ref Range Status   Specimen Description EXPECTORATED SPUTUM  Final   Special Requests NONE  Final   Sputum evaluation   Final    THIS SPECIMEN IS ACCEPTABLE FOR SPUTUM CULTURE Performed at [facility], [address], [place], [place] [address]    Report Status 06/24/2023 FINAL  Final  Culture, Respiratory w Gram Stain     Status: None   Collection Time: 06/23/23  8:05 AM  Result Value Ref Range Status   Specimen Description   Final    EXPECTORATED SPUTUM Performed at [facility], [address], [place], [place] [address]    Special Requests   Final    NONE Reflexed from [id number] Performed at [facility], [address], [place], [place] [address]    Gram Stain   Final    NO WBC SEEN RARE GRAM POSITIVE COCCI IN CHAINS RARE GRAM POSITIVE RODS    Culture   Final    ABUNDANT Normal respiratory flora-no Staph aureus or Pseudomonas seen Performed at [facility], [address], [place], [place] [address]    Report Status 06/25/2023 FINAL  Final      Radiology Studies: MR ABDOMEN W WO CONTRAST Result Date: 06/26/2023 CLINICAL DATA:  Lung cancer. Multiple liver lesions on staging abdomen/pelvis CT. EXAM: MRI ABDOMEN  WITHOUT AND WITH CONTRAST TECHNIQUE: Multiplanar multisequence MR imaging of the abdomen was performed both before and after the administration of intravenous contrast. CONTRAST:  9mL GADAVIST GADOBUTROL 1 MMOL/ML IV SOLN COMPARISON:  Abdomen and pelvis CT 06/23/2023 FINDINGS: Lower chest: Small left lower lobe pulmonary nodule better characterized on recent CT imaging. Hepatobiliary: Multiple small liver lesions are seen in both hepatic lobes. Index lesion lateral segment left liver measures 14 mm on axial T2 image 17 of series 4. Index lesion in the medial right liver measures 8 mm on image 15/4. Lesions range in size from approximately 3-4 mm up to the dominant lateral segment left liver lesion at 14 mm. Lesions demonstrate restricted perfusion. Postcontrast imaging is markedly motion degraded which limits assessment. Dominant lesions in the left liver demonstrate subtle irregular rim enhancement (see left hepatic dome lesion on 33/17 and lateral segment left liver lesion on image 36/12). Gallbladder is nondistended with tiny gallstone evident. No gallbladder wall thickening or pericholecystic fluid. No intrahepatic or extrahepatic biliary dilation. Pancreas: No focal mass lesion. No dilatation of the main duct. No intraparenchymal cyst. No peripancreatic edema. Spleen:  No splenomegaly. No suspicious focal mass lesion. Adrenals/Urinary Tract: No right adrenal nodule or mass. 15 mm left adrenal nodule shows loss of signal intensity on out of phase T1 imaging consistent with adrenal adenoma. Small cortical cysts are noted in both kidneys. 2.9 x 2.8  cm heterogeneously enhancing lesion is identified in the lower pole right kidney, not well evaluated on postcontrast imaging due to substantial motion degradation. Imaging features concerning for renal cell carcinoma. A second similar 13 mm lesion is identified in the interpolar right kidney (axial 72/17) which may have ill-defined peripheral enhancement, raising  concern for neoplasm. 10 mm interpolar left renal lesion on 68/17 is indeterminate as enhancement cannot be excluded on the 3 minute delayed imaging. Stomach/Bowel: Tiny hiatal hernia. Stomach otherwise unremarkable. Duodenum does not cross the midline as expected consistent with intestinal malrotation. No small bowel or colonic dilatation within the visualized abdomen. Vascular/Lymphatic: No abdominal aortic aneurysm. No abdominal lymphadenopathy. Other:  No intraperitoneal free fluid. Musculoskeletal: No focal suspicious marrow enhancement within the visualized bony anatomy. IMPRESSION: 1. Multiple small liver lesions in both hepatic lobes, ranging in size from approximately 3-4 mm up to the dominant lateral segment left liver lesion at 14 mm. Although postcontrast imaging is markedly motion degraded, imaging features are concerning for metastatic disease. 2. 2.9 x 2.8 cm heterogeneously enhancing lesion in the lower pole right kidney, not well evaluated on postcontrast imaging due to substantial motion degradation. Imaging features concerning for renal cell carcinoma. A second similar 13 mm lesion is identified in the interpolar right kidney and a 10 mm interpolar left renal lesion are indeterminate as enhancement cannot be excluded on the 3 minute delayed imaging. Close follow-up recommended. 3. 15 mm left adrenal adenoma. 4. Cholelithiasis. 5. Tiny hiatal hernia. 6. Intestinal malrotation. Electronically Signed   By: Kennith Center M.D.   On: 06/26/2023 05:22       LOS: 1 day   Osvaldo Shipper  [facility] Pager on [email or web address]  06/26/2023, 9:45 AM

## 2023-06-26 NOTE — Progress Notes (Signed)
   06/26/23 1430  What Happened  Was fall witnessed? No  Was patient injured? No  Patient found on floor;in bathroom  Found by No one-pt stated they fell (sister and sister in law in room and heard patient fall)  Stated prior activity ambulating-unassisted  Provider Notification  Provider Name/Title Dr Rito Ehrlich  Date Provider Notified 06/26/23  Time Provider Notified 1447  Method of Notification Page  Notification Reason Fall  Date of Provider Response 06/26/23  Time of Provider Response 1451  Follow Up  Family notified Yes - comment  Time family notified 1430  Simple treatment Other (comment) (None)  Adult Fall Risk Assessment  Risk Factor Category (scoring not indicated) Fall has occurred during this admission (document High fall risk);High fall risk per protocol (document High fall risk)  Patient Fall Risk Level High fall risk  Adult Fall Risk Interventions  Required Bundle Interventions *See Row Information* High fall risk - low, moderate, and high requirements implemented  Additional Interventions Family Supervision;Use of appropriate toileting equipment (bedpan, BSC, etc.)  Fall intervention(s) refused/Patient educated regarding refusal Bed alarm;Nonskid socks;Open door if unsupervised;Supervision while toileting/edge of bed sitting  PCA/Epidural/Spinal Assessment  Respiratory Pattern Regular;Unlabored  Neurological  Neuro (WDL) WDL  Level of Consciousness Alert  Orientation Level Oriented X4  Musculoskeletal  Musculoskeletal (WDL) X  Assistive Device Stedy  Generalized Weakness Yes  Weight Bearing Restrictions Per Provider Order No  Integumentary  Integumentary (WDL) X  Skin Integrity Other (Comment);Abrasion (left elbow scrapped, no bleeding)  Abrasion Location Elbow  Abrasion Location Orientation Left   Patient up with sister to BR. Patient standing at sink washing hands when legs gave out and patient fell landing on tailbone. Family called out for help. RN and  mobility specialist able to stand patient up to chair. Steady used to get patient back to bed. Family and RN reviewed calling and not falling and making sure help at bedside when getting up. MD paged.

## 2023-06-26 NOTE — Progress Notes (Signed)
PT Cancellation Note  Patient Details Name: LENABELLE ROSTEN MRN: [id number] DOB: [birth year]   Cancelled Treatment:    Reason Eval/Treat Not Completed: Other (comment). Pt reports recently meeting with Palliative medicine team, reports overwhelmed and wanting to sit and talk with sister and sister-in-law. Noted biopsy scheduled for 1/21. Will continue to follow for acute PT as appropriate.     Domenick Bookbinder PT, DPT 06/26/23, 2:13 PM

## 2023-06-26 NOTE — Consult Note (Signed)
Palliative Medicine Inpatient Consult Note  Consulting Provider:  Osvaldo Shipper, MD   Reason for consult:   Palliative Care Consult Services Palliative Medicine Consult  Reason for Consult? GOC.  Diagnosed lung mass with possible metastases in the liver.  Possible renal cell cancer as well.  Patient not sure about treatment going forward.  Would be helpful to have goals of care conversation with her.   06/26/2023  HPI:  Per intake H&P -->  78 y.o. female with medical history significant of chronic pain, hyperlipidemia hypothyroidism tobacco abuse osteopenia, and anxiety depression, Yolanda Yolanda who was brought in by EMS after she was found to be unresponsive by family member. She was apparently vomiting as well. She had taken 8 to 10 tablets of Valium because she could not sleep. She denied suicidal intent. Workup in the emergency department raised concern for aspiration pneumonia and also revealed masses in the lung. She was hospitalized for further management. The Palliative medicine team has been asked to get involved for goals of care in the setting of possible metastatic disease.   Clinical Assessment/Goals of Care:  *Please note that this is a verbal dictation therefore any spelling or grammatical errors are due to the "Dragon Medical One" system interpretation.  I have reviewed medical records including EPIC notes, labs and imaging, received report from bedside RN, assessed the patient who is sitting up in the recliner in no acute distress. She is eating her lunch.    I met with Yolanda Yolanda, her sister,  Yolanda Yolanda and sister in law to further discuss diagnosis prognosis, GOC, EOL wishes, disposition and options.   I introduced Palliative Medicine as specialized medical care for people living with serious illness. It focuses on providing relief from the symptoms and stress of a serious illness. The goal is to improve quality of life for both the patient and the family.  Medical History  Review and Understanding:  A review of Yolanda Yolanda's past medical history significant for HLD, hypothyroidism, osteopenia, anxiety and depression, & chronic pain.  Social History:  Yolanda Yolanda is from [place], [place]. She is not married. She has one daughter and two grandchildren. She formerly worked as Biochemist, clinical for a call center. She is a woman of strong [employer] faith.  Functional and Nutritional State:  Preceding hospitalization Yolanda Yolanda shares that she has full independent of all bADL's and iADL's. She does have a good appetite.   Advance Directives:  A detailed discussion was had today regarding advanced directives.  Yolanda Yolanda is able to make decisions for herself. She shares a strong family support who would join together is needed.  Code Status:  Concepts specific to code status, artifical feeding and hydration, continued IV antibiotics and rehospitalization was had.  The difference between a aggressive medical intervention path  and a palliative comfort care path for this patient at this time was had.   Patient at this time is full code - this will be discussed further as the context of the below conversation was overwhelming for the patient.  Discussion:  Discussions related to Yolanda Yolanda's present health were held. She shares her understanding of her reason for hospitalization. She is aware that she inadvertently took to many medications to try to sleep. She does not desire self harm.  We reviewed that Yolanda Yolanda has been hospitalized since Thursday of last week. Imaging studies have identified that she has multiple liver lesions, Yolanda lesions, and lung lesions concerning for "cancer". She shares that she understands she has "three different cancers".  We discussed the concerns associated with patients imaging studies. We reviewed the plan for a biopsy tomorrow. We discussed through getting the biopsy additional decisions regarding treatment or no treatment  could be made. As it stands right now, Yolanda Yolanda is not certain if she would want chemotherapy or radiation therapy if they were offered.  We discussed if treatment was not desired the consideration of hospice care. I described hospice as a service for patients who have a life expectancy of 6 months or less. The goal of hospice is the preservation of dignity and quality at the end phases of life. Under hospice care, the focus changes from curative to symptom relief.   Yolanda Yolanda shared that she "was not expecting" this conversation. I apologized given the gravity of the conversation being held. I shared the importance of considering what if important to Yolanda Yolanda. She took time to digest what was being shared. She expressed knowledge that she may be looking at a terminal prognosis.  Allowing time and space for Yolanda Yolanda to express her feelings. I offered emotional support through reflective listening.   Plan at this time will be to obtain biopsy and review results with Oncology thereafter.  Discussed the importance of continued conversation with family and their  medical providers regarding overall plan of care and treatment options, ensuring decisions are within the context of the patients values and GOCs.  Decision Maker: Patient can presently make her own decisions.  SUMMARY OF RECOMMENDATIONS   Full code at this time --> Plan to have further discussions related to this in the oncoming day(s)  Plan for biopsy tomorrow  Patient and her family would want to better understand results and options from the Oncology team once biopsy is resulted  Patient is not sure of if she would want treatment(s) therefore the topic of hospice was broached  The PMT will continue to follow along and offer support  Code Status/Advance Care Planning: FULL CODE  Palliative Prophylaxis:  Aspiration, Bowel Regimen, Delirium Protocol, Frequent Pain Assessment, Oral Care, Palliative Wound Care, and Turn  Reposition  Additional Recommendations (Limitations, Scope, Preferences): Continue present care  Psycho-social/Spiritual:  Desire for further Chaplaincy support: Patient is presbyterian and calling upon her own community   Prognosis: Unclear at this time though appears patient has multiple lesions throughout various organs (lungs, liver, Yolanda) and if it is determined she does not want treatment then hospice is very reasonable.  Discharge Planning: To be determined.   Vitals:   06/26/23 0503 06/26/23 1200  BP: (!) 186/98 (!) 145/79  Pulse: (!) 57 66  Resp: 14 16  Temp: 98 F (36.7 C) 97.6 F (36.4 C)  SpO2: 93% 96%    Intake/Output Summary (Last 24 hours) at 06/26/2023 1255 Last data filed at 06/26/2023 0900 Gross per 24 hour  Intake 480 ml  Output --  Net 480 ml   Last Weight  Most recent update: 06/23/2023  6:28 PM    Weight  92.9 kg (204 lb 14.4 oz)            Gen:  Elderly Caucasian F in NAD HEENT: moist mucous membranes CV: Regular rate and rhythm  PULM: On RA, breathing is even and nonlabored ABD: soft/nontender  EXT: No edema  Neuro: Alert and oriented x3   PPS: 50%   This conversation/these recommendations were discussed with patient primary care team, Dr. Rito Ehrlich  Billing based on MDM: High  Problems Addressed: One acute or chronic illness or injury that poses a threat to life  or bodily function  Amount and/or Complexity of Data: Category 3:Discussion of management or test interpretation with external physician/other qualified health care professional/appropriate source (not separately reported)  Risks: Decision regarding hospitalization or escalation of hospital care and Decision not to resuscitate or to de-escalate care because of poor prognosis ______________________________________________________ Lamarr Lulas [facility] Palliative Medicine Team Team Cell Phone: [phone] Please utilize secure chat with additional questions, if there  is no response within 30 minutes please call the above phone number  Palliative Medicine Team providers are available by phone from 7am to 7pm daily and can be reached through the team cell phone.  Should this patient require assistance outside of these hours, please call the patient's attending physician.

## 2023-06-26 NOTE — Consult Note (Signed)
Brief Psychiatry Consult Note  The patient was last seen by the psychiatry service on 1/19. Today they have had a long discussion with palliative care team with biopsy planned for tomorrow. They also had a fall per documentation. When I went to see her today, she was sound asleep - opted not to wake up. Current rec is for inpt psychiatry, however pt hs been denying her OD was a suicide attempt and has been open to other treatment modalities while medically admitted including aforementioned biopsy - unclear if she will meet criteria for inpt psych by the time she is medically stable.   - see prior notes - qt noted 519 today in setting of sinus brady - no charge today   Yolanda Reeves A [name]

## 2023-06-27 ENCOUNTER — Inpatient Hospital Stay (HOSPITAL_COMMUNITY): Payer: Medicare Other

## 2023-06-27 ENCOUNTER — Other Ambulatory Visit: Payer: Self-pay

## 2023-06-27 DIAGNOSIS — Z515 Encounter for palliative care: Secondary | ICD-10-CM | POA: Diagnosis not present

## 2023-06-27 DIAGNOSIS — R911 Solitary pulmonary nodule: Secondary | ICD-10-CM | POA: Diagnosis not present

## 2023-06-27 DIAGNOSIS — J690 Pneumonitis due to inhalation of food and vomit: Secondary | ICD-10-CM | POA: Diagnosis not present

## 2023-06-27 DIAGNOSIS — R918 Other nonspecific abnormal finding of lung field: Secondary | ICD-10-CM | POA: Diagnosis not present

## 2023-06-27 DIAGNOSIS — T424X1A Poisoning by benzodiazepines, accidental (unintentional), initial encounter: Secondary | ICD-10-CM | POA: Diagnosis not present

## 2023-06-27 DIAGNOSIS — Z7189 Other specified counseling: Secondary | ICD-10-CM | POA: Diagnosis not present

## 2023-06-27 LAB — MAGNESIUM: Magnesium: 2.6 mg/dL — ABNORMAL HIGH (ref 1.7–2.4)

## 2023-06-27 LAB — COMPREHENSIVE METABOLIC PANEL
ALT: 12 U/L (ref 0–44)
AST: 16 U/L (ref 15–41)
Albumin: 3.5 g/dL (ref 3.5–5.0)
Alkaline Phosphatase: 52 U/L (ref 38–126)
Anion gap: 10 (ref 5–15)
BUN: 32 mg/dL — ABNORMAL HIGH (ref 8–23)
CO2: 23 mmol/L (ref 22–32)
Calcium: 8.9 mg/dL (ref 8.9–10.3)
Chloride: 102 mmol/L (ref 98–111)
Creatinine, Ser: 1.29 mg/dL — ABNORMAL HIGH (ref 0.44–1.00)
GFR, Estimated: 43 mL/min — ABNORMAL LOW (ref >60)
Glucose, Bld: 86 mg/dL (ref 70–99)
Potassium: 4.1 mmol/L (ref 3.5–5.1)
Sodium: 135 mmol/L (ref 135–145)
Total Bilirubin: 0.5 mg/dL (ref 0.0–1.2)
Total Protein: 6.9 g/dL (ref 6.5–8.1)

## 2023-06-27 LAB — CBC
HCT: 36 % (ref 36.0–46.0)
Hemoglobin: 11.7 g/dL — ABNORMAL LOW (ref 12.0–15.0)
MCH: 30.4 pg (ref 26.0–34.0)
MCHC: 32.5 g/dL (ref 30.0–36.0)
MCV: 93.5 fL (ref 80.0–100.0)
Platelets: 200 10*3/uL (ref 150–400)
RBC: 3.85 MIL/uL — ABNORMAL LOW (ref 3.87–5.11)
RDW: 15.3 % (ref 11.5–15.5)
WBC: 9.4 10*3/uL (ref 4.0–10.5)
nRBC: 0 % (ref 0.0–0.2)

## 2023-06-27 LAB — VITAMIN B1: Vitamin B1 (Thiamine): 82.9 nmol/L (ref 66.5–200.0)

## 2023-06-27 MED ORDER — LIDOCAINE HCL 1 % IJ SOLN
INTRAMUSCULAR | Status: AC
Start: 2023-06-27 — End: ?
  Filled 2023-06-27: qty 20

## 2023-06-27 MED ORDER — FENTANYL CITRATE (PF) 100 MCG/2ML IJ SOLN
INTRAMUSCULAR | Status: AC
  Filled 2023-06-27: qty 2

## 2023-06-27 MED ORDER — OXYCODONE HCL 5 MG PO TABS
5.0000 mg | ORAL_TABLET | ORAL | Status: DC | PRN
  Administered 2023-06-27 – 2023-06-28 (×3): 5 mg via ORAL
  Filled 2023-06-27 (×2): qty 1

## 2023-06-27 MED ORDER — MIDAZOLAM HCL 2 MG/2ML IJ SOLN
INTRAMUSCULAR | Status: AC
  Filled 2023-06-27: qty 2

## 2023-06-27 MED ORDER — LIDOCAINE 5 % EX PTCH
1.0000 | MEDICATED_PATCH | Freq: Once | CUTANEOUS | Status: AC
  Administered 2023-06-27: 1 via TRANSDERMAL
  Filled 2023-06-27: qty 1

## 2023-06-27 MED ORDER — SODIUM CHLORIDE 0.45 % IV SOLN: INTRAVENOUS | Status: DC

## 2023-06-27 MED ORDER — MIDAZOLAM HCL 2 MG/2ML IJ SOLN
INTRAMUSCULAR | Status: AC | PRN
  Administered 2023-06-27: 1 mg via INTRAVENOUS

## 2023-06-27 MED ORDER — FENTANYL CITRATE (PF) 100 MCG/2ML IJ SOLN
INTRAMUSCULAR | Status: AC | PRN
Start: 2023-06-27 — End: 2023-06-27
  Administered 2023-06-27: 50 ug via INTRAVENOUS

## 2023-06-27 MED ORDER — PREDNISONE 20 MG PO TABS
20.0000 mg | ORAL_TABLET | Freq: Every day | ORAL | Status: AC
  Administered 2023-06-28 – 2023-06-30 (×3): 20 mg via ORAL
  Filled 2023-06-27 (×3): qty 1

## 2023-06-27 NOTE — Progress Notes (Signed)
OT Cancellation Note  Patient Details Name: Yolanda Reeves MRN: [id number] DOB: March 28, 1946   Cancelled Treatment:    Reason Eval/Treat Not Completed: Medical issues which prohibited therapy. Pt on bedrest x 2 hrs s/p biopsy today. OT will follow up next available time as appropriate  Galen Manila 06/27/2023, 2:36 PM

## 2023-06-27 NOTE — Progress Notes (Signed)
Palliative Medicine Inpatient Follow Up Note HPI: 78 y.o. female with medical history significant of chronic pain, hyperlipidemia hypothyroidism tobacco abuse osteopenia, and anxiety depression, [name] who was brought in by EMS after she was found to be unresponsive by family member. She was apparently vomiting as well. She had taken 8 to 10 tablets of Valium because she could not sleep. She denied suicidal intent. Workup in the emergency department raised concern for aspiration pneumonia and also revealed masses in the lung. She was hospitalized for further management. The Palliative medicine team has been asked to get involved for goals of care in the setting of possible metastatic disease.   Today's Discussion 06/27/2023  *Please note that this is a verbal dictation therefore any spelling or grammatical errors are due to the "Dragon Medical One" system interpretation.  Chart reviewed inclusive of vital signs, progress notes, laboratory results, and diagnostic images.   I met with Yolanda Reeves at bedside in the company of her brother, Yolanda Reeves and sister, Yolanda Reeves. We discussed our conversation from yesterday. She shares that she was caught off guard. Discussed her advanced directives which she shares she does have. Patients family plan to look for them and bring a copy in.   Reviewed code status in detail. Kierrah is clear that she would not desire chest compressions, shocks, intubation, or any other artifical measures to extend her life. Her family was in agreement with this also.   Overnight, Yolanda Reeves had a fall in the restroom though per her sister she was not significantly hurt. We discussed the concerns of her trying to go home alone. Patients sister shares that her living situation is not cleanly nor safe for her. We reviewed her delirium and the worry associated with this in the hospital and thereafter.   The topic of a skilled nursing facility for physical strengthening was brought up.    Created space and opportunity for patient to explore thoughts feelings and fears regarding current medical situation. We reviewed that it is difficult to know what decision will be best in the setting of her disease burden at this time. I shared the hope that the biopsy would provide her the clarity needed to make additional decisions in the near future.   Questions and concerns addressed/Palliative Support Provided.   Objective Assessment: Vital Signs Vitals:   06/27/23 0315 06/27/23 1056  BP: 137/80   Pulse: (!) 51   Resp: 16   Temp: (!) 97.3 F (36.3 C)   SpO2: 96% 91%    Intake/Output Summary (Last 24 hours) at 06/27/2023 1241 Last data filed at 06/27/2023 1040 Gross per 24 hour  Intake 0 ml  Output 33 ml  Net -33 ml   Last Weight  Most recent update: 06/23/2023  6:28 PM    Weight  92.9 kg (204 lb 14.4 oz)            Gen:  Elderly Caucasian F in NAD HEENT: moist mucous membranes CV: Regular rate and rhythm  PULM: On RA, breathing is even and nonlabored ABD: soft/nontender  EXT: No edema  Neuro: Alert and oriented x3   SUMMARY OF RECOMMENDATIONS   DNAR/DNI code status  Patients family plan to bring in a copy of her advanced directives   Plan for biopsy this afternoon   Patient and her family would want to better understand results and options from the Oncology team once biopsy is resulted  Strict delirium precautions  PT/OT to determine patients functional needs   The PMT will  continue to follow along and offer support  Billing based on MDM: High ______________________________________________________________________________________ Lamarr Lulas [facility] Palliative Medicine Team Team Cell Phone: [phone] Please utilize secure chat with additional questions, if there is no response within 30 minutes please call the above phone number  Palliative Medicine Team providers are available by phone from 7am to 7pm daily and can be reached through  the team cell phone.  Should this patient require assistance outside of these hours, please call the patient's attending physician.

## 2023-06-27 NOTE — Consult Note (Signed)
[facility] Consult Follow-Up  Patient Name: .Yolanda Reeves  MRN: [id number]  DOB: 12/24/1945  Consult Order details: Requested per pt's brother/depressed and anxious/accidental overdose/[place]  Mode of Visit: In person    Psychiatry Consult Evaluation  Service Date: June 27, 2023 LOS:  LOS: 2 days  Chief Complaint "I feel depressed because I'm hard of hearing, going blind, and unable to drive or go to the grocery store. I don't want to kill myself but I don't care if I die."   Primary Psychiatric Diagnoses  Major depressive disorder 2.  Generalized anxiety disorder 3.  Depression due to another medical condition (hypothyroidism).   Assessment  ILISA SHUEY is a 78 y.o. female admitted: Medicallyfor 06/22/2023  5:46 PM for . She carries the psychiatric diagnoses of Major depression and Anxiety and has a past medical history of chronic pain, hyperlipidemia, hypothyroidism, tobacco abuse, and osteopenia.   Her current presentation of depressed mood, lack of motivation, low energy level, hopelessness, helplessness, suicidal ideation and recent overdose is most consistent with Major depressive disorder. A diagnoses of Depression due to another medical condition can also be entertained due to the history of poorly treated  hypothyroidism (vs noncompliance w synthroid).  In a vacuum, She meets criteria for inpatient admission based on being severely depressed, ongoing suicidal ideation, being elderly, lives alone - however this admission has revealed a new cancer diagnosis and it is unclear if she will be returning to living alone. She has been persistently confused/delirious since admission. Current outpatient psychotropic medications include Zoloft 150 mg daily, Lamotrigine 150 mg daily and Valium 5 mg daily for anxiety. Historically she has had poor response to these medications. patient has been compliant with these medications prior to admission per report. Adding medications  has been challenging in the setting of qtc prolongation and bradycardia.  On initial examination, patient was calm and cooperative, she is alert and oriented to time, place, person and situation. She is now less oriented with poor attention. She no longer remembers the overdose that led to her admission. Patient's statements that she doesn't care if she lives or dies are congruent with long-standing values (father died of lung cancer despite aggressive treatment).Please see plan below for detailed recommendations.   Diagnoses:  Active Hospital problems: Principal Problem:   Benzodiazepine overdose, accidental or unintentional, initial encounter Active Problems:   HYPOTHYROIDISM, POST-RADIOACTIVE IODINE   Chest pain, atypical   Aspiration pneumonia (HCC)   Prolonged QT interval   Lung mass   COPD with acute exacerbation (HCC)   Severe episode of recurrent major depressive disorder, without psychotic features (HCC)    Plan   ## Psychiatric Medication Recommendations:  -Please wean patient off Valium due to risk of sedation, recurrent pneumonia, risk of fall in elderly, cognitive impairment, respiratory suppression, and worsening of depression as a CNS depressant.  -Continue Lamotrigine 150 mg daily.  -Consider Buspirone 5 mg three times daily for anxiety and up titrate as needed.  -Patient will benefit from inpatient psychiatric admission once she is medically cleared due to recent overdose.  -Consider TOC/Social worker consult to facilitate inpatient psychiatric transfer once medically stable.  -Synthroid increased by primary; this should help to treat depression.   ## Medical Decision Making Capacity: Not specifically addressed in this encounter  -- most recent EKG on 1/19 had qtc of 519 in setting of sinus brady. Labs earlier this admission includes: HB-11.2, TSH-66.58, T4-0.34  ## Disposition:-- Pt persistently delirious. Unclear if inpt psychiatry is best place  once medically clear  - will reassess closer to dc.   ## Behavioral / Environmental: - No specific recommendations at this time.     ## Safety and Observation Level:  - Based on my clinical evaluation, I estimate the patient to be at moderate risk of self harm in the current setting. - At this time, we recommend  1:1 Observation. This decision is based on my review of the chart including patient's history and current presentation, interview of the patient, mental status examination, and consideration of suicide risk including evaluating suicidal ideation, plan, intent, suicidal or self-harm behaviors, risk factors, and protective factors. This judgment is based on our ability to directly address suicide risk, implement suicide prevention strategies, and develop a safety plan while the patient is in the clinical setting. Please contact our team if there is a concern that risk level has changed.  CSSR Risk Category:C-SSRS RISK CATEGORY: No Risk  Suicide Risk Assessment: Patient has following modifiable risk factors for suicide: active suicidal ideation, under treated depression , and social isolation, which we are addressing by synthroid increase, potential referral to SNF.  Patient has following non-modifiable or demographic risk factors for suicide: separation or divorce Patient has the following protective factors against suicide: Supportive family and Pets in the home  Thank you for this consult request. Recommendations have been communicated to the primary team.  We will follow up at this time.   Darus Hershman A Mackay Hanauer       History of Present Illness  Relevant Aspects of Hospital Hospital Course:  Admitted on 06/22/2023 after she was found unconscious after a non-suicidal OD  on Valium. Patient is 78 year old female with medical history significant of chronic pain, hyperlipidemia, hypothyroidism, tobacco abuse, osteopenia, anxiety, depression, kidney stone who was brought in by EMS after she was found to be  unresponsive by family member. She was apparently vomiting as well. She had taken 8 to 10 tablets of Valium 5 mg pills because she could not sleep. She denied suicidal intent. Workup in the emergency department raised concern for aspiration pneumonia and also revealed masses in the lung.She is being worked up for now.   Patient Report:  Pt seen in mid- to late- afternoon. Had to awaken from sleep. Knows it is 2025 but thinks it is March. She is aware that she is in the hospital because she is "sick" - knows she has cancer. States she has gotten CT scans for this; does not remember biopsy this AM. States she had a "good morning" but doesn't remember much about it. Does remember recent falls. Looking forward to her sister visiting. Denied SI, HI, AH/VH; pt ultimately terminated interview to use the restroom. Overall seemed much more confused than prior psych evals on 1/18 and 1/19 in which she was able to have long detailed conversations with MD Jannifer Franklin and NP Lord.    Psych ROS:  Depression: depressed mood, lack of motivation, low energy level, fatigue, hopelessness, helplessness, poor sleep, suicide ideations.  Anxiety: excessive anxiety, irritability, apprehensive, and nervousness.  Mania (lifetime and current): denies  Psychosis: (lifetime and current): occasional visual hallucinations but denies auditory hallucinations and delusions.   Collateral information:  Called sister - she was unaware that inpt psych had been recommended. She is mostly worried about the confusion and the cancer at this point. Pt did make an offhand comment to brother about wanting a bullet but has otherwise denied suicidality at all times.   Review of Systems  Psychiatric/Behavioral:  Positive  for depression. The patient is nervous/anxious.   All other systems reviewed and are negative.    Psychiatric and Social History  Psychiatric History:  Information collected from the patient   Prev Dx/Sx: Major depression and  Anxiety Current Psych Provider: None Home Meds (current): Zoloft 150 mg daily, Lamotrigine 150 mg daily and Valium 5 mg daily for anxiety.  Previous Med Trials: None  Therapy: denies   Prior Psych Hospitalization: denies   Prior Self Harm: denies Prior Violence: denies   Family Psych History: denies  Family Hx suicide: denies   Social History:  Educational Hx: high school graduate Occupational Hx: unemployed, retired.  Legal Hx: denies  Living Situation: lives alone in her apartment  Spiritual Hx: unknown  Access to weapons/lethal means: denies    Substance History Alcohol: denies   Type of alcohol former smoker, but currently vape Nicotine.  Last Drink unknown  Number of drinks per day N/A History of alcohol withdrawal seizures denies  History of DT's denies  Tobacco: former smoker  Illicit drugs: denies  Prescription drug abuse: denies  Rehab hx: denies   Exam Findings  Physical Exam: General appearance: Awake alert.  In no distress Resp: Coarse breath sounds with few crackles at the bases.  Occasional scattered wheezing.  No rhonchi. Cardio: S1-S2 is normal regular.  No S3-S4.  No rubs murmurs or bruit GI: Abdomen is soft.  Nontender nondistended.  Bowel sounds are present normal.  No masses organomegaly Extremities: No edema.  Some physical deconditioning is noted Neurologic: Alert and oriented x3.  No focal neurological deficits.   Vital Signs:  Temp:  [97.3 F (36.3 C)-98 F (36.7 C)] 97.5 F (36.4 C) (01/21 1309) Pulse Rate:  [51-65] 59 (01/21 1309) Resp:  [16-20] 16 (01/21 1309) BP: (121-150)/(68-88) 125/73 (01/21 1309) SpO2:  [88 %-97 %] 97 % (01/21 1309) Blood pressure 125/73, pulse (!) 59, temperature (!) 97.5 F (36.4 C), temperature source Oral, resp. rate 16, height 5\' 2"  (1.575 m), weight 92.9 kg, SpO2 97%. Body mass index is 37.48 kg/m.  Physical Exam Vitals and nursing note reviewed.  Constitutional:      Appearance: Normal appearance.   HENT:     Head: Normocephalic.     Nose: Nose normal.  Pulmonary:     Effort: Pulmonary effort is normal.  Musculoskeletal:     Cervical back: Normal range of motion.  Neurological:     General: No focal deficit present.     Mental Status: She is alert and oriented to person, place, and time.     Mental Status Exam: General Appearance: Casual and Well Groomed  Orientation:  Other:  Partial  Memory:  Recent;   Poor  Concentration:  Concentration: Good  Recall:  Fair  Attention  Poor  Eye Contact:  Fair  Speech:  Normal Rate  Language:  Good  Volume:  Decreased  Mood: "I'm confused and in pain"  Affect:  Congruent  Thought Process:  Linear  Thought Content:  Logical  Suicidal Thoughts:  No  Homicidal Thoughts:  No  Judgement:  Poor  Insight:  Lacking  Psychomotor Activity:  Decreased  Akathisia:  No  Fund of Knowledge:  Good      Assets:  Communication Skills Social Support  Cognition:  WNL  ADL's:  Impaired  AIMS (if indicated):        Other History   These have been pulled in through the EMR, reviewed, and updated if appropriate.  Family History:  The patient's  family history includes Cancer in her brother; Colon cancer in her paternal grandmother; Prostate cancer in her brother; Skin cancer in her sister; Thyroid cancer in her brother.  Medical History: Past Medical History:  Diagnosis Date   Best dystrophy 2012   Chest pain, atypical    Heart murmur    HYPERLIPIDEMIA    HYPOTHYROIDISM, POST-RADIOACTIVE IODINE    OSTEOPENIA    Sleep apnea    TOBACCO USE    UNSPECIFIED SUBJECTIVE VISUAL DISTURBANCE    VITAMIN D DEFICIENCY    WART, RIGHT HAND     Surgical History: Past Surgical History:  Procedure Laterality Date   ABDOMINAL HYSTERECTOMY     CARPAL TUNNEL RELEASE     EXTRACORPOREAL SHOCK WAVE LITHOTRIPSY Right 10/15/2020   Procedure: EXTRACORPOREAL SHOCK WAVE LITHOTRIPSY (ESWL);  Surgeon: Alfredo Martinez, MD;  Location: [facility];  Service: Urology;  Laterality: Right;   EXTRACORPOREAL SHOCK WAVE LITHOTRIPSY Right 11/19/2020   Procedure: EXTRACORPOREAL SHOCK WAVE LITHOTRIPSY (ESWL);  Surgeon: Jerilee Field, MD;  Location: [facility];  Service: Urology;  Laterality: Right;   EYE SURGERY     LAPAROSCOPIC OOPHERECTOMY     LITHOTRIPSY     TONSILLECTOMY       Medications:   Current Facility-Administered Medications:    0.45 % sodium chloride infusion, , Intravenous, Continuous, Osvaldo Shipper, MD, Last Rate: 75 mL/hr at 06/27/23 1311, New Bag at 06/27/23 1311   acetaminophen (TYLENOL) tablet 650 mg, 650 mg, Oral, Q6H PRN, 650 mg at 06/27/23 0122 **OR** acetaminophen (TYLENOL) suppository 650 mg, 650 mg, Rectal, Q6H PRN, Doutova, Anastassia, MD   albuterol (PROVENTIL) (2.5 MG/3ML) 0.083% nebulizer solution 2.5 mg, 2.5 mg, Nebulization, Q2H PRN, Doutova, Anastassia, MD, 2.5 mg at 06/27/23 1056   amLODipine (NORVASC) tablet 5 mg, 5 mg, Oral, Daily, Osvaldo Shipper, MD, 5 mg at 06/27/23 1127   amoxicillin-clavulanate (AUGMENTIN) 875-125 MG per tablet 1 tablet, 1 tablet, Oral, Q12H, Osvaldo Shipper, MD, 1 tablet at 06/27/23 1128   busPIRone (BUSPAR) tablet 5 mg, 5 mg, Oral, TID, Osvaldo Shipper, MD, 5 mg at 06/27/23 1610   guaiFENesin (MUCINEX) 12 hr tablet 600 mg, 600 mg, Oral, BID, Doutova, Anastassia, MD, 600 mg at 06/27/23 9604   lamoTRIgine (LAMICTAL) tablet 150 mg, 150 mg, Oral, Daily, Osvaldo Shipper, MD, 150 mg at 06/27/23 5409   levothyroxine (SYNTHROID) tablet 150 mcg, 150 mcg, Oral, QAC breakfast, Doutova, Anastassia, MD, 150 mcg at 06/27/23 0450   lidocaine (LIDODERM) 5 % 1 patch, 1 patch, Transdermal, Q24H, Luiz Iron, NP, 1 patch at 06/26/23 2205   nicotine (NICODERM CQ - dosed in mg/24 hours) patch 14 mg, 14 mg, Transdermal, Daily, Osvaldo Shipper, MD, 14 mg at 06/27/23 8119   oxyCODONE (Oxy IR/ROXICODONE) immediate release tablet 5 mg, 5 mg, Oral, Q6H PRN, Osvaldo Shipper, MD, 5  mg at 06/27/23 1127   polyethylene glycol (MIRALAX / GLYCOLAX) packet 17 g, 17 g, Oral, Daily PRN, Osvaldo Shipper, MD, 17 g at 06/26/23 2205   pravastatin (PRAVACHOL) tablet 40 mg, 40 mg, Oral, q1800, Doutova, Anastassia, MD, 40 mg at 06/26/23 1805   [START ON 06/28/2023] predniSONE (DELTASONE) tablet 20 mg, 20 mg, Oral, Q breakfast, Osvaldo Shipper, MD   senna-docusate (Senokot-S) tablet 2 tablet, 2 tablet, Oral, BID, Osvaldo Shipper, MD, 2 tablet at 06/27/23 1128  Allergies: No Known Allergies  Claris Che A Jean Alejos

## 2023-06-27 NOTE — Procedures (Signed)
Vascular and Interventional Radiology Procedure Note  Patient: Yolanda Reeves DOB: [birth year] Medical Record Number: [id number] Note Date/Time: 06/27/23 12:26 PM   Performing Physician: Roanna Banning, MD Assistant(s): None  Diagnosis: Lung mass w liver lesions, Q mets   Procedure: LIVER MASS BIOPSY  Anesthesia: Conscious Sedation Complications: None Estimated Blood Loss: Minimal Specimens: Sent for Pathology  Findings:  Successful Ultrasound-guided biopsy of L hepatic lobe mass. A total of 3 samples were obtained. Hemostasis of the tract was achieved using Manual Pressure.  Plan: Bed rest for 2 hours.  See detailed procedure note with images in PACS. The patient tolerated the procedure well without incident or complication and was returned to Recovery in stable condition.    Roanna Banning, MD [facility]   Pager. [phone] Clinic. [phone]

## 2023-06-27 NOTE — Progress Notes (Addendum)
[facility] PROGRESS NOTE   ROBENA MALONEY WUJ:[id number] DOB: 09/24/1945 DOA: 06/22/2023  PCP: Pearline Cables, MD  Brief History: 78 y.o. female with medical history significant of chronic pain, hyperlipidemia hypothyroidism tobacco abuse osteopenia, and anxiety depression, kidney stone who was brought in by EMS after she was found to be unresponsive by family member.  She was apparently vomiting as well.  She had taken 8 to 10 tablets of Valium because she could not sleep.  She denied suicidal intent.  Workup in the emergency department raised concern for aspiration pneumonia and also revealed masses in the lung.  She was hospitalized for further management.  Consultants: Pulmonology.  Interventional radiology.  Palliative care  Procedures: Echocardiogram    Subjective/Interval History: Patient denies any further falls.  No dizziness or lightheadedness.  No chest pain.  Some pain in the left knee but has been able to ambulate without difficulty.    Assessment/Plan:  Aspiration pneumonia with concern for acute bronchitis/history of COPD Likely as a result of the unintentional overdose.   Seen by speech therapy.  No needs identified. Antibiotics were changed to oral.  Steroids were changed to oral.  Respiratory status is stable. Not on oxygen currently. Will give her a quick steroid taper.  Give her antibiotics for a total of 5 days.  Unintentional benzodiazepine overdose She took 8 to 10 tablets of Valium in order to go to sleep.  Denied any suicidal ideation.  Holding benzodiazepines for now.  She is now fully awake and alert. Not certain how she acquired the Valium.  No sedative or narcotic prescription data is noted on the prescriber database.  She has not been prescribed any of these medications recently, at least locally. Psychiatry was consulted by pulmonology.  Seen by psychiatry who made medication recommendations which included continuing lamotrigine and  initiating mirtazapine at bedtime and buspirone 3 times a day.  EKG was repeated and continues to show QT prolongation.  Will not start mirtazapine at this time.   Psychiatry also recommended inpatient psychiatric admission.  Will have psychiatry reassess once medical workup has been completed to see if she still would need inpatient psychiatric admission.  She denies any suicidal ideation at this time.  Lung mass/liver lesions concerning for metastatic process She underwent a CT angiogram which was negative for PE but showed a large left apical lung mass measuring up to 6.2 cm.  Additional spiculated solid mass in the right upper lobe measuring 2.4 cm.  Pulmonary nodules on the left side were also noted.  Enlarged left hilar and AP window lymph nodes were also noted.  She is a smoker.  Used to smoke cigarettes and now she vapes. CT of the abdomen pelvis raises concern for metastatic process in the liver and elsewhere.  Pulmonology suggested biopsying one of the liver lesions.  IR wants to better characterize these lesions before attempting biopsy so MRI of the abdomen has been ordered.  MRI of the abdomen was done which showed multiple small liver lesions raising concern for metastatic disease. IR plans to biopsy this morning.  Patient will need to be set up with Oncology in a week or so. Dr. Pamelia Hoit will arrange.  Elevated creatinine Elevated BUN also noted.  Creatinine noted to be 1.29 today.  It was 0.81 on 1/18.  Not noted to be on any nephrotoxic agents.  Will hydrate her and recheck labs tomorrow.  Right kidney lesion 2.9 x 2.8 cm lesion noted in the right kidney raising  concern for renal cell carcinoma.  Another lesion measuring 13 mm also noted in the interpolar right kidney and a 10 mm lesion in the left kidney was also noted.  This may also need further testing and/or urological input.  This can be pursued in the outpatient setting.  Incidental cholelithiasis Asymptomatic.  Left adrenal  adenoma Will need outpatient monitoring.    Hypothyroidism Supposed to be on 125 mcg of Synthroid on a daily basis. Compliance is questionable. Her TSH was noted to be 66.5.  Free T4 noted to be 0.34.   Dose of levothyroxine was increased.   Thyroid function test will need to be rechecked in a few weeks.  Elevated blood pressure readings Not noted to be on any antihypertensives currently.  Does not appear that she was any antihypertensives prior to admission either.  She was added on amlodipine with improvement in blood pressures.  Hyperlipidemia Continue statin.  QT prolongation Avoid QT prolonging medications.  Atypical chest pain Likely due to lung mass.  No ischemic changes on EKG.   Echocardiogram shows normal LVEF with grade 1 diastolic dysfunction.  No wall motion abnormalities.  No significant valvular disease noted. No further cardiac workup at this time.  Symptoms could be due to her lung mass.  Normocytic anemia There was dilutional drop in hemoglobin but stable for the most part.  Class III obesity Estimated body mass index is 37.48 kg/m as calculated from the following:   Height as of this encounter: 5\' 2"  (1.575 m).   Weight as of this encounter: 92.9 kg.  Goals of care Palliative care is following.  DVT Prophylaxis: SCDs only for now Code Status: Full code Family Communication: Discussed with the patient Disposition Plan: Start mobilizing.  Psychiatry recommends inpatient psychiatric admission when medically clear.     Medications: Scheduled:  amLODipine  5 mg Oral Daily   amoxicillin-clavulanate  1 tablet Oral Q12H   busPIRone  5 mg Oral TID   guaiFENesin  600 mg Oral BID   lamoTRIgine  150 mg Oral Daily   levothyroxine  150 mcg Oral QAC breakfast   lidocaine  1 patch Transdermal Q24H   lidocaine  1 patch Transdermal Once   nicotine  14 mg Transdermal Daily   pravastatin  40 mg Oral q1800   predniSONE  40 mg Oral Q breakfast   senna-docusate  2  tablet Oral BID   Continuous:   ZOX:[id number] **OR** acetaminophen, albuterol, oxyCODONE, polyethylene glycol  Antibiotics: Anti-infectives (From admission, onward)    Start     Dose/Rate Route Frequency Ordered Stop   06/25/23 1200  amoxicillin-clavulanate (AUGMENTIN) 875-125 MG per tablet 1 tablet        1 tablet Oral Every 12 hours 06/25/23 0945 06/29/23 0959   06/23/23 1200  Ampicillin-Sulbactam (UNASYN) 3 g in sodium chloride 0.9 % 100 mL IVPB  Status:  Discontinued        3 g 200 mL/hr over 30 Minutes Intravenous Every 6 hours 06/23/23 1045 06/25/23 0945   06/23/23 0500  Ampicillin-Sulbactam (UNASYN) 3 g in sodium chloride 0.9 % 100 mL IVPB  Status:  Discontinued        3 g 200 mL/hr over 30 Minutes Intravenous Every 8 hours 06/22/23 2308 06/23/23 1045   06/22/23 2045  Ampicillin-Sulbactam (UNASYN) 3 g in sodium chloride 0.9 % 100 mL IVPB        3 g 200 mL/hr over 30 Minutes Intravenous  Once 06/22/23 2042 06/22/23 2147  Objective:  Vital Signs  Vitals:   06/26/23 1500 06/26/23 1900 06/26/23 2257 06/27/23 0315  BP: 126/80 (!) 150/80 128/88 137/80  Pulse: (!) 56 65 (!) 55 (!) 51  Resp: 16 20 18 16   Temp: 97.6 F (36.4 C) 98 F (36.7 C) (!) 97.4 F (36.3 C) (!) 97.3 F (36.3 C)  TempSrc: Oral  Oral Oral  SpO2: 95% 93% 96% 96%  Weight:      Height:       No intake or output data in the 24 hours ending 06/27/23 0910  Filed Weights   06/22/23 1752 06/23/23 1827  Weight: 98.4 kg 92.9 kg    General appearance: Awake alert.  In no distress Resp: Clear to auscultation bilaterally.  Normal effort Cardio: S1-S2 is normal regular.  No S3-S4.  No rubs murmurs or bruit GI: Abdomen is soft.  Nontender nondistended.  Bowel sounds are present normal.  No masses organomegaly Extremities: No edema.  No swelling noted over the left knee area. Neurologic: Alert and oriented x3.  No focal neurological deficits.     Lab Results:  Data Reviewed: I have personally  reviewed following labs and reports of the imaging studies  CBC: Recent Labs  Lab 06/22/23 1900 06/23/23 0443 06/24/23 0430 06/27/23 0427  WBC 5.4 5.8 7.5 9.4  NEUTROABS 3.3  --   --   --   HGB 12.0 11.9* 11.2* 11.7*  HCT 39.2 38.1 35.3* 36.0  MCV 95.6 96.7 94.4 93.5  PLT 181 170 194 200    Basic Metabolic Panel: Recent Labs  Lab 06/22/23 1900 06/23/23 0443 06/24/23 0430 06/27/23 0427  NA 141 141 137 135  K 3.6 3.8 3.7 4.1  CL 107 108 105 102  CO2 26 24 23 23   GLUCOSE 90 106* 120* 86  BUN 16 13 16  32*  CREATININE 1.09* 0.94 0.81 1.29*  CALCIUM 8.7* 8.7* 8.6* 8.9  MG 2.5* 2.3  --  2.6*  PHOS 3.8 3.6  --   --     GFR: Estimated Creatinine Clearance: 39.4 mL/min (A) (by C-G formula based on SCr of 1.29 mg/dL (H)).  Liver Function Tests: Recent Labs  Lab 06/22/23 1900 06/23/23 0443 06/27/23 0427  AST 14* 12* 16  ALT 9 8 12   ALKPHOS 58 61 52  BILITOT 0.8 0.4 0.5  PROT 6.7 6.8 6.9  ALBUMIN 3.2* 3.4* 3.5    Coagulation Profile: Recent Labs  Lab 06/22/23 2343  INR 1.0    Cardiac Enzymes: Recent Labs  Lab 06/22/23 1900  CKTOTAL 54    CBG: Recent Labs  Lab 06/22/23 1756  GLUCAP 100*      Recent Results (from the past 240 hours)  Culture, blood (routine x 2) Call MD if unable to obtain prior to antibiotics being given     Status: None (Preliminary result)   Collection Time: 06/22/23 11:00 PM   Specimen: BLOOD RIGHT HAND  Result Value Ref Range Status   Specimen Description   Final    BLOOD RIGHT HAND Performed at [facility], [address], [place], [place] [address]    Special Requests   Final    BOTTLES DRAWN AEROBIC AND ANAEROBIC Blood Culture results may not be optimal due to an inadequate volume of blood received in culture bottles Performed at [facility], [address], [place], [place] [address]    Culture   Final    NO GROWTH 3 DAYS Performed at [facility], [address]  [place], [place] [address]    Report Status PENDING  Incomplete  Culture, blood (routine x 2) Call MD if unable to obtain prior to antibiotics being given     Status: None (Preliminary result)   Collection Time: 06/22/23 11:15 PM   Specimen: BLOOD LEFT HAND  Result Value Ref Range Status   Specimen Description   Final    BLOOD LEFT HAND Performed at [facility], [address], [place], [place] [address]    Special Requests   Final    BOTTLES DRAWN AEROBIC AND ANAEROBIC Blood Culture results may not be optimal due to an inadequate volume of blood received in culture bottles Performed at [facility], [address], [place], [place] [address]    Culture   Final    NO GROWTH 3 DAYS Performed at [facility], [address], [place], [place] [address]    Report Status PENDING  Incomplete  Expectorated Sputum Assessment w Gram Stain, Rflx to Resp Cult     Status: None   Collection Time: 06/23/23  8:05 AM   Specimen: Expectorated Sputum  Result Value Ref Range Status   Specimen Description EXPECTORATED SPUTUM  Final   Special Requests NONE  Final   Sputum evaluation   Final    THIS SPECIMEN IS ACCEPTABLE FOR SPUTUM CULTURE Performed at [facility], [address] [place], [place] [address]    Report Status 06/24/2023 FINAL  Final  Culture, Respiratory w Gram Stain     Status: None   Collection Time: 06/23/23  8:05 AM  Result Value Ref Range Status   Specimen Description   Final    EXPECTORATED SPUTUM Performed at [facility], [address] [place], [place] [address]    Special Requests   Final    NONE Reflexed from [phone] Performed at [facility], [address] [place], [place] [address]    Gram Stain   Final    NO WBC SEEN RARE GRAM POSITIVE COCCI IN CHAINS RARE GRAM POSITIVE RODS    Culture   Final    ABUNDANT Normal respiratory flora-no Staph aureus or Pseudomonas  seen Performed at [facility], [address], [place], [place] [address]    Report Status 06/25/2023 FINAL  Final      Radiology Studies: MR ABDOMEN W WO CONTRAST Result Date: 06/26/2023 CLINICAL DATA:  Lung cancer. Multiple liver lesions on staging abdomen/pelvis CT. EXAM: MRI ABDOMEN WITHOUT AND WITH CONTRAST TECHNIQUE: Multiplanar multisequence MR imaging of the abdomen was performed both before and after the administration of intravenous contrast. CONTRAST:  9mL GADAVIST GADOBUTROL 1 MMOL/ML IV SOLN COMPARISON:  Abdomen and pelvis CT 06/23/2023 FINDINGS: Lower chest: Small left lower lobe pulmonary nodule better characterized on recent CT imaging. Hepatobiliary: Multiple small liver lesions are seen in both hepatic lobes. Index lesion lateral segment left liver measures 14 mm on axial T2 image 17 of series 4. Index lesion in the medial right liver measures 8 mm on image 15/4. Lesions range in size from approximately 3-4 mm up to the dominant lateral segment left liver lesion at 14 mm. Lesions demonstrate restricted perfusion. Postcontrast imaging is markedly motion degraded which limits assessment. Dominant lesions in the left liver demonstrate subtle irregular rim enhancement (see left hepatic dome lesion on 33/17 and lateral segment left liver lesion on image 36/12). Gallbladder is nondistended with tiny gallstone evident. No gallbladder wall thickening or pericholecystic  fluid. No intrahepatic or extrahepatic biliary dilation. Pancreas: No focal mass lesion. No dilatation of the main duct. No intraparenchymal cyst. No peripancreatic edema. Spleen:  No splenomegaly. No suspicious focal mass lesion. Adrenals/Urinary Tract: No right adrenal nodule or mass. 15 mm left adrenal nodule shows loss of signal intensity on out of phase T1 imaging consistent with adrenal adenoma. Small cortical cysts are noted in both kidneys. 2.9 x 2.8 cm heterogeneously enhancing lesion is identified in the lower pole  right kidney, not well evaluated on postcontrast imaging due to substantial motion degradation. Imaging features concerning for renal cell carcinoma. A second similar 13 mm lesion is identified in the interpolar right kidney (axial 72/17) which may have ill-defined peripheral enhancement, raising concern for neoplasm. 10 mm interpolar left renal lesion on 68/17 is indeterminate as enhancement cannot be excluded on the 3 minute delayed imaging. Stomach/Bowel: Tiny hiatal hernia. Stomach otherwise unremarkable. Duodenum does not cross the midline as expected consistent with intestinal malrotation. No small bowel or colonic dilatation within the visualized abdomen. Vascular/Lymphatic: No abdominal aortic aneurysm. No abdominal lymphadenopathy. Other:  No intraperitoneal free fluid. Musculoskeletal: No focal suspicious marrow enhancement within the visualized bony anatomy. IMPRESSION: 1. Multiple small liver lesions in both hepatic lobes, ranging in size from approximately 3-4 mm up to the dominant lateral segment left liver lesion at 14 mm. Although postcontrast imaging is markedly motion degraded, imaging features are concerning for metastatic disease. 2. 2.9 x 2.8 cm heterogeneously enhancing lesion in the lower pole right kidney, not well evaluated on postcontrast imaging due to substantial motion degradation. Imaging features concerning for renal cell carcinoma. A second similar 13 mm lesion is identified in the interpolar right kidney and a 10 mm interpolar left renal lesion are indeterminate as enhancement cannot be excluded on the 3 minute delayed imaging. Close follow-up recommended. 3. 15 mm left adrenal adenoma. 4. Cholelithiasis. 5. Tiny hiatal hernia. 6. Intestinal malrotation. Electronically Signed   By: Kennith Center M.D.   On: 06/26/2023 05:22       LOS: 2 days   Osvaldo Shipper  [facility] Pager on [email or web address]  06/27/2023, 9:10 AM

## 2023-06-27 NOTE — Progress Notes (Signed)
NAME:  Yolanda Reeves, MRN:  [id number], DOB:  1945/08/12, LOS: 2 ADMISSION DATE:  06/22/2023, CONSULTATION DATE:  06/23/23  REFERRING MD:  dr Rito Ehrlich, CHIEF COMPLAINT:  Lung mass   History of Present Illness:  78 year old female with past medical history as below, which is significant for hyperlipidemia, hypothyroidism, chronic pain, deaf, blind, and anxiety. She was in her usual state of health until 1/17 when she was found to be unresponsive by family members and EMS was called. She was also found to have vomited. Upon arrival to the emergency department she was somewhat more lucid and admitted to taking 10 tablets of Valium because she has had significant anxiety recently and was attempting to sleep. She reports taking them over period of hours when she did not appreciate the desired effect of the previous doses. She does not recall being brought to the emergency department. Denied suicidal ideation. Workup in the emergency department was concerning for aspiration pneumonia and chest x-ray demonstrated possible lung mass in the right upper lobe. CT angiogram of the chest was negative for pulmonary embolism, but did describe a large left apical lung mass measuring 6.2 cm in addition to a spiculated solid mass in the right upper lobe measuring 2.4 cm and additional left-sided pulmonary nodules. Enlarged left hilar lymph nodes also noted. IR was consulted for possible biopsy of the left apical lung mass, however, they recommended pulmonary consultation due to poor target for percutaneous biopsy. PCCM's been asked to see.   Pertinent  Medical History   Past Medical History:  Diagnosis Date   Best dystrophy 2012   Chest pain, atypical    Heart murmur    HYPERLIPIDEMIA    HYPOTHYROIDISM, POST-RADIOACTIVE IODINE    OSTEOPENIA    Sleep apnea    TOBACCO USE    UNSPECIFIED SUBJECTIVE VISUAL DISTURBANCE    VITAMIN D DEFICIENCY    WART, RIGHT HAND    Significant Hospital Events: Including  procedures, antibiotic start and stop dates in addition to other pertinent events   CT chest noted MRI abdomen 06/25/2023 noted  Interim History / Subjective:  Complaining of some shortness of breath this morning  Objective   Blood pressure 137/80, pulse (!) 51, temperature (!) 97.3 F (36.3 C), temperature source Oral, resp. rate 16, height 5\' 2"  (1.575 m), weight 92.9 kg, SpO2 96%.        Intake/Output Summary (Last 24 hours) at 06/27/2023 1056 Last data filed at 06/27/2023 0930 Gross per 24 hour  Intake 0 ml  Output --  Net 0 ml   Filed Weights   06/22/23 1752 06/23/23 1827  Weight: 98.4 kg 92.9 kg    Examination: General: Elderly does not appear to be in distress HENT: Normocephalic, atraumatic Lungs: Fair air entry bilaterally, decreased at the bases Cardiovascular: S1-S2 Abdomen: Soft, bowel sounds appreciated Extremities: No clubbing, no edema Neuro: Awake, alert and oriented GU:   Resolved Hospital Problem list     Assessment & Plan:  Patient with large left upper lobe mass abutting the mediastinum Spiculated right upper lobe nodule with smaller mass, other nodules noted She has a 50-pack-year smoking history  MRI with evidence of metastatic disease -IR to biopsy today  If biopsy not diagnostic, will need navigational bronchoscopy for the left upper lobe lesion and EBUS for adenopathy  Unintentional drug overdose with benzodiazepine  She has had of hearing, blind  Continue lines of care  Bronchodilator treatments  Virl Diamond, MD [place] [facility] Pager: See Loretha Stapler

## 2023-06-27 NOTE — Progress Notes (Signed)
       Overnight   NAME: Yolanda Reeves MRN: [id number] DOB : [birth year]    Date of Service   06/27/2023   HPI/Events of Note    Notified by RN for concern over Psych Physician recommendation.  Psychiatry physician diagnosis" benzodiazepine overdose, accidental or unintentional." "Patient will benefit from inpatient psychiatric admission once she is medically cleared due to recent overdose."  "At this time, we recommend one-to-one observation."   Interventions/ Plan   TeleSitter ordered Patient will remain in higher acuity floor Continue all previous orders.      Chinita Greenland BSN MSNA MSN ACNPC-AG Acute Care Nurse Practitioner [facility]

## 2023-06-28 DIAGNOSIS — E018 Other iodine-deficiency related thyroid disorders and allied conditions: Secondary | ICD-10-CM | POA: Diagnosis not present

## 2023-06-28 DIAGNOSIS — T424X1A Poisoning by benzodiazepines, accidental (unintentional), initial encounter: Secondary | ICD-10-CM | POA: Diagnosis not present

## 2023-06-28 DIAGNOSIS — I10 Essential (primary) hypertension: Secondary | ICD-10-CM

## 2023-06-28 DIAGNOSIS — J690 Pneumonitis due to inhalation of food and vomit: Secondary | ICD-10-CM | POA: Diagnosis not present

## 2023-06-28 DIAGNOSIS — D649 Anemia, unspecified: Secondary | ICD-10-CM | POA: Insufficient documentation

## 2023-06-28 DIAGNOSIS — E66813 Obesity, class 3: Secondary | ICD-10-CM

## 2023-06-28 DIAGNOSIS — F332 Major depressive disorder, recurrent severe without psychotic features: Secondary | ICD-10-CM | POA: Diagnosis not present

## 2023-06-28 DIAGNOSIS — R918 Other nonspecific abnormal finding of lung field: Secondary | ICD-10-CM | POA: Diagnosis not present

## 2023-06-28 DIAGNOSIS — Z515 Encounter for palliative care: Secondary | ICD-10-CM | POA: Diagnosis not present

## 2023-06-28 LAB — URINALYSIS, ROUTINE W REFLEX MICROSCOPIC
Bilirubin Urine: NEGATIVE
Glucose, UA: NEGATIVE mg/dL
Hgb urine dipstick: NEGATIVE
Ketones, ur: NEGATIVE mg/dL
Leukocytes,Ua: NEGATIVE
Nitrite: NEGATIVE
Protein, ur: NEGATIVE mg/dL
Specific Gravity, Urine: 1.006 (ref 1.005–1.030)
pH: 5 (ref 5.0–8.0)

## 2023-06-28 LAB — BASIC METABOLIC PANEL
Anion gap: 8 (ref 5–15)
BUN: 30 mg/dL — ABNORMAL HIGH (ref 8–23)
CO2: 24 mmol/L (ref 22–32)
Calcium: 8.5 mg/dL — ABNORMAL LOW (ref 8.9–10.3)
Chloride: 104 mmol/L (ref 98–111)
Creatinine, Ser: 1.19 mg/dL — ABNORMAL HIGH (ref 0.44–1.00)
GFR, Estimated: 47 mL/min — ABNORMAL LOW (ref >60)
Glucose, Bld: 91 mg/dL (ref 70–99)
Potassium: 3.9 mmol/L (ref 3.5–5.1)
Sodium: 136 mmol/L (ref 135–145)

## 2023-06-28 LAB — CULTURE, BLOOD (ROUTINE X 2)
Culture: NO GROWTH
Culture: NO GROWTH

## 2023-06-28 LAB — CBC
HCT: 36.1 % (ref 36.0–46.0)
Hemoglobin: 11.4 g/dL — ABNORMAL LOW (ref 12.0–15.0)
MCH: 30.2 pg (ref 26.0–34.0)
MCHC: 31.6 g/dL (ref 30.0–36.0)
MCV: 95.5 fL (ref 80.0–100.0)
Platelets: 151 10*3/uL (ref 150–400)
RBC: 3.78 MIL/uL — ABNORMAL LOW (ref 3.87–5.11)
RDW: 15.7 % — ABNORMAL HIGH (ref 11.5–15.5)
WBC: 7.1 10*3/uL (ref 4.0–10.5)
nRBC: 0 % (ref 0.0–0.2)

## 2023-06-28 LAB — SURGICAL PATHOLOGY

## 2023-06-28 MED ORDER — ESCITALOPRAM OXALATE 10 MG PO TABS
5.0000 mg | ORAL_TABLET | Freq: Every day | ORAL | Status: DC
  Administered 2023-06-28 – 2023-07-08 (×11): 5 mg via ORAL
  Filled 2023-06-28 (×11): qty 1

## 2023-06-28 MED ORDER — LIDOCAINE 5 % EX PTCH
2.0000 | MEDICATED_PATCH | CUTANEOUS | Status: AC
  Administered 2023-06-28: 2 via TRANSDERMAL
  Filled 2023-06-28: qty 2

## 2023-06-28 MED ORDER — HYDROXYZINE HCL 25 MG PO TABS
25.0000 mg | ORAL_TABLET | Freq: Four times a day (QID) | ORAL | Status: AC | PRN
Start: 2023-06-28 — End: 2023-06-29
  Administered 2023-06-28: 25 mg via ORAL
  Filled 2023-06-28 (×2): qty 1

## 2023-06-28 MED ORDER — KETOROLAC TROMETHAMINE 15 MG/ML IJ SOLN
15.0000 mg | Freq: Once | INTRAMUSCULAR | Status: AC
  Administered 2023-06-28: 15 mg via INTRAVENOUS
  Filled 2023-06-28: qty 1

## 2023-06-28 NOTE — Progress Notes (Signed)
Physical Therapy Treatment Patient Details Name: Yolanda Reeves MRN: [id number] DOB: 05/13/1946 Today's Date: 06/28/2023   History of Present Illness Yolanda Reeves is a 78 yo female admitted with unintentional benzodiazepine overdose. PMH: hyperlipidemia, hypothyroidism, chronic pain, HOH, legally blind, and anxiety.    PT Comments  Attempted PT tx session this am. Sister present during session. RN and sister report pt had been medicated for pain.  Multiple attemps by therapist and pt's sister to get her to participate. Pt not following commands. Pt was resistant to assistance to help her get EOB. Deferred any further attempts. Spoke with MD who requested therapy attempt again later today. RN reported pt had been up and OOB earlier today-even attempting to get OOB without assistance.  Will attempt to check back as schedule allows.    If plan is discharge home, recommend the following:     Can travel by private vehicle        Equipment Recommendations       Recommendations for Other Services       Precautions / Restrictions Precautions Precautions: Fall Precaution Comments: legally blind; very HOH Restrictions Weight Bearing Restrictions Per Provider Order: No     Mobility  Bed Mobility Overal bed mobility: Needs Assistance             General bed mobility comments: Multiple attemps by therapist and pt's sister to get her to participate. Pt not following commands. Resistant to assistance to help her get EOB    Transfers                        Ambulation/Gait                   Stairs             Wheelchair Mobility     Tilt Bed    Modified Rankin (Stroke Patients Only)       Balance                                            Cognition Arousal:  (drowsy) Behavior During Therapy: Flat affect Overall Cognitive Status: Impaired/Different from baseline Area of Impairment: Problem solving, Memory                      Memory: Decreased short-term memory       Problem Solving: Decreased initiation, Difficulty sequencing, Requires verbal cues, Requires tactile cues General Comments: not following commands during session today        Exercises      General Comments        Pertinent Vitals/Pain Pain Assessment Pain Assessment: Faces Faces Pain Scale: Hurts little more Pain Location: "all over" Pain Intervention(s): Premedicated before session    Home Living                          Prior Function            PT Goals (current goals can now be found in the care plan section) Progress towards PT goals: Not progressing toward goals - comment    Frequency    Min 1X/week      PT Plan      Co-evaluation              AM-PAC PT "6 Clicks"  Mobility   Outcome Measure  Help needed turning from your back to your side while in a flat bed without using bedrails?: Total Help needed moving from lying on your back to sitting on the side of a flat bed without using bedrails?: Total Help needed moving to and from a bed to a chair (including a wheelchair)?: Total Help needed standing up from a chair using your arms (e.g., wheelchair or bedside chair)?: Total Help needed to walk in hospital room?: Total Help needed climbing 3-5 steps with a railing? : Total 6 Click Score: 6    End of Session   Activity Tolerance:  (limited by cognition/willingness to participate) Patient left: in bed;with call bell/phone within reach;with bed alarm set;with family/visitor present   PT Visit Diagnosis: Muscle weakness (generalized) (M62.81)     Time: 1021-1030 PT Time Calculation (min) (ACUTE ONLY): 9 min  Charges:    $Therapeutic Activity: 8-22 mins PT General Charges $$ ACUTE PT VISIT: 1 Visit              Faye Ramsay, PT Acute Rehabilitation  Office: [phone]

## 2023-06-28 NOTE — Assessment & Plan Note (Signed)
Hgb stable 

## 2023-06-28 NOTE — Assessment & Plan Note (Addendum)
Completed 7-day course of antibiotics, now resolved

## 2023-06-28 NOTE — Progress Notes (Signed)
Occupational Therapy Treatment Patient Details Name: Yolanda Reeves MRN: [id number] DOB: 04-18-1946 Today's Date: 06/28/2023   History of present illness 78  yo female admitted with unintentional benzodiazepine overdose. Biopsy 1/21. Fall while in hospital. PMH: hyperlipidemia, hypothyroidism, chronic pain, HOH, legally blind, and anxiety.   OT comments  Pt making slow progress with functional goals, however required encouragement from family and therapists, pt agreeable to participate. She was able to sit EOB min A, stand to RW min A/CGA and walk to and from bathroom with Min A and use of a RW. Pt participated in toileting task, grooming/hygiene and UB dressing tasks. Pt with poor safety awareness, poor insight into deficits. Family is very supportive, but pt lives alone. Unfortunately, she doesn't demonstrate the ability to safely manage her care at home alone. Patient will benefit from continued inpatient follow up therapy, <3 hours/day. OT will continue to follow acutely to maximize level of function and safety      If plan is discharge home, recommend the following:  A little help with walking and/or transfers;A lot of help with bathing/dressing/bathroom;Assistance with cooking/housework;Direct supervision/assist for medications management;Assist for transportation;Supervision due to cognitive status;Direct supervision/assist for financial management;Help with stairs or ramp for entrance   Equipment Recommendations  Tub/shower seat (RW)    Recommendations for Other Services      Precautions / Restrictions Precautions Precautions: Fall Precaution Comments: legally blind; very HOH Restrictions Weight Bearing Restrictions Per Provider Order: No       Mobility Bed Mobility Overal bed mobility: Needs Assistance Bed Mobility: Supine to Sit, Sit to Supine     Supine to sit: Min assist Sit to supine: Min assist   General bed mobility comments: Increased time and encouragement  required. Min A to help encourage pt-could likely perform task unassisted if motivated to    Transfers Overall transfer level: Needs assistance Equipment used: Rolling walker (2 wheels) Transfers: Sit to/from Stand Sit to Stand: Min assist, From elevated surface           General transfer comment: Assist to rise, steady, control descent. Cues for safety, hand placement. Increased time.     Balance Overall balance assessment: Needs assistance, History of Falls Sitting-balance support: Feet unsupported, No upper extremity supported       Standing balance support: Reliant on assistive device for balance, During functional activity, Bilateral upper extremity supported Standing balance-Leahy Scale: Poor                             ADL either performed or assessed with clinical judgement   ADL Overall ADL's : Needs assistance/impaired     Grooming: Wash/dry hands;Wash/dry face;Contact guard assist;Standing           Upper Body Dressing : Contact guard assist;Sitting       Toilet Transfer: Minimal assistance;Ambulation;Rolling walker (2 wheels);Cueing for safety;Cueing for sequencing;Regular Toilet;BSC/3in1;Grab bars   Toileting- Clothing Manipulation and Hygiene: Minimal assistance;Sit to/from stand;Cueing for safety       Functional mobility during ADLs: Minimal assistance;Rolling walker (2 wheels);Cueing for safety;Cueing for sequencing      Extremity/Trunk Assessment Upper Extremity Assessment Upper Extremity Assessment: Generalized weakness;Right hand dominant   Lower Extremity Assessment Lower Extremity Assessment: Defer to PT evaluation   Cervical / Trunk Assessment Cervical / Trunk Assessment: Normal    Vision Baseline Vision/History: 2 Legally blind Ability to See in Adequate Light: 2 Moderately impaired Patient Visual Report: No change from baseline  Perception     Praxis      Cognition Arousal: Alert   Overall Cognitive  Status: Impaired/Different from baseline Area of Impairment: Problem solving, Memory, Following commands, Safety/judgement                     Memory: Decreased short-term memory Following Commands: Follows multi-step commands inconsistently Safety/Judgement: Decreased awareness of safety   Problem Solving: Decreased initiation, Difficulty sequencing, Requires verbal cues, Requires tactile cues General Comments: not following commands during session today        Exercises      Shoulder Instructions       General Comments      Pertinent Vitals/ Pain       Pain Assessment Pain Assessment: Faces Faces Pain Scale: Hurts even more Pain Location: buttocks Pain Descriptors / Indicators: Grimacing Pain Intervention(s): Monitored during session, Limited activity within patient's tolerance, Repositioned  Home Living                                          Prior Functioning/Environment              Frequency  Min 1X/week        Progress Toward Goals  OT Goals(current goals can now be found in the care plan section)  Progress towards OT goals: Progressing toward goals     Plan      Co-evaluation      Reason for Co-Treatment: To address functional/ADL transfers;Necessary to address cognition/behavior during functional activity   OT goals addressed during session: ADL's and self-care;Proper use of Adaptive equipment and DME      AM-PAC OT "6 Clicks" Daily Activity     Outcome Measure   Help from another person eating meals?: None Help from another person taking care of personal grooming?: A Little Help from another person toileting, which includes using toliet, bedpan, or urinal?: A Lot Help from another person bathing (including washing, rinsing, drying)?: A Lot Help from another person to put on and taking off regular upper body clothing?: A Little Help from another person to put on and taking off regular lower body clothing?: A  Lot 6 Click Score: 16    End of Session Equipment Utilized During Treatment: Gait belt;Rolling walker (2 wheels);Other (comment) (3 in 1 over toilet)  OT Visit Diagnosis: Unsteadiness on feet (R26.81);Muscle weakness (generalized) (M62.81);History of falling (Z91.81)   Activity Tolerance Patient tolerated treatment well   Patient Left in bed;with call bell/phone within reach;with bed alarm set;with family/visitor present;Other (comment) (MD in with pt)   Nurse Communication          Time: [phone] OT Time Calculation (min): 34 min  Charges: OT General Charges $OT Visit: 1 Visit OT Treatments $Self Care/Home Management : 8-22 mins    Galen Manila 06/28/2023, 4:27 PM

## 2023-06-28 NOTE — Plan of Care (Signed)

## 2023-06-28 NOTE — Assessment & Plan Note (Signed)
TSH 66 on admission, mostly asymptomatic.  Likely nonadherence in setting of dementia. - Continue LT4 - Recheck TSH in 4 weeks

## 2023-06-28 NOTE — Assessment & Plan Note (Signed)
-   Continue Buspar, Lamictal

## 2023-06-28 NOTE — Progress Notes (Signed)
  Progress Note   Patient: Yolanda Reeves:[id number] DOB: 1945-07-13 DOA: 06/22/2023     3 DOS: the patient was seen and examined on 06/28/2023 at 10:43AM      Brief hospital course: 78 y.o. female with medical history significant of chronic pain, hyperlipidemia hypothyroidism tobacco abuse osteopenia, and anxiety depression, kidney stone who was brought in by EMS after she was found to be unresponsive by family member. She was apparently vomiting as well. She had taken 8 to 10 tablets of Valium because she could not sleep. She denied suicidal intent. Workup in the emergency department raised concern for aspiration pneumonia and also revealed masses in the lung. She was hospitalized for further management.       Assessment and Plan: * Aspiration pneumonia (HCC) - Continue Augmentin, day 7 of 7  Benzodiazepine overdose, accidental or unintentional, initial encounter Benzodiazepines were held at admission.   - Consult Psychiatry, appreciate cares    Lung mass Liver mass Underwent CT guided liver biopsy on 1/21 which showed squamous cancer, metastatic from lung.  The patient does not appear to be decisional regarding cancer care.  She has a daughter from whom she is estranged until recently, and so her surrogates would be her sister [name] Kidney and brother Gene. - Consult Oncology and Palliative care   Essential hypertension BP controlled - Continue amlodipine  Normocytic anemia Hgb stable  Class 3 obesity BMI 41  Severe episode of recurrent major depressive disorder, without psychotic features (HCC) - Continue Buspar, Lamictal  Prolonged QT interval - Repeat ECG  Hyperlipidemia - Continue pravastatin  HYPOTHYROIDISM, POST-RADIOACTIVE IODINE TSH 66 on admission, mostly asymptomatic.  Likely nonadherence in setting of dementia. - Continue LT4 - Recheck TSH in 4 weeks   Urinary urgency UA ruled out UTI.  PVR shows only 200cc.   - Trial purewick   Elevated  creatinine Cr stable from yesterday, slightly up from baseline.     Right kidney lesion - Work up pending GOC discussions   Incidental cholelithiasis Asymptomatic.   Left adrenal adenoma - Attention on repeat imaging       Subjective: Patient has no complaints to me, denies concerns, but seems sleepy and inattentive.  Family note urinary urgency, increased globalized weakness today, and more confusion.  No fever, no respiratory symptoms.     Physical Exam: BP (!) 146/76 (BP Location: Right Arm)   Pulse 67   Temp 98 F (36.7 C) (Oral)   Resp 18   Ht 5\' 2"  (1.575 m)   Wt 92.9 kg   SpO2 98%   BMI 37.48 kg/m   Obese adult female, sitting up in bed, sleepy but makes eye contact and responds to questions RRR, no murmurs, no peripheral edema Respiratory rate normal, lungs clear without rales or wheezes Abdomen soft without tenderness to palpation or guarding, no ascites or distention Face symmetric, speech fluent, attention diminished, affect blunted, psychomotor slowing noted, oriented to self, and hospital only, generalized weakness but symmetric in upper and lower extremities bilaterally    Data Reviewed: Discussed with pulmonology, oncology, and palliative care Echocardiogram shows normal EF Abdomen ultrasound shows a 2 cm liver mass Basic metabolic panel shows creatinine stable at 1.2 CBC shows hemoglobin 11.4 Urinalysis without protein, white cells, bacteria    Family Communication: Sister at the bedside    Disposition: Status is: Inpatient         Author: Alberteen Sam, MD 06/28/2023 1:49 PM  For on call review [email or web address].

## 2023-06-28 NOTE — Assessment & Plan Note (Addendum)
Patient took multiple Valium prior to admission and was unresponsive.  This apparently was inadvertent, she wanted to sleep (also suspect some incipient memory loss).  Consulted psychiatry, they evaluated the patient, felt she was stable and have recommended outpatient follow-up.

## 2023-06-28 NOTE — Assessment & Plan Note (Addendum)
Resolved

## 2023-06-28 NOTE — Assessment & Plan Note (Signed)
BMI 41 

## 2023-06-28 NOTE — Plan of Care (Signed)
  Problem: Nutrition: Goal: Adequate nutrition will be maintained Outcome: Progressing   Problem: Coping: Goal: Level of anxiety will decrease Outcome: Progressing   

## 2023-06-28 NOTE — Assessment & Plan Note (Signed)
Liver mass Underwent CT guided liver biopsy on 1/21 which showed squamous cancer, metastatic from lung.  The patient does not appear to be decisional regarding cancer care.  She has a daughter from whom she is estranged until recently, and so her surrogates would be her sister Yolanda Reeves and brother Yolanda Reeves. - Consult Oncology and Palliative care

## 2023-06-28 NOTE — Hospital Course (Addendum)
78 y.o. female with medical history significant of chronic pain, hyperlipidemia hypothyroidism tobacco abuse osteopenia, and anxiety depression, kidney stone who was brought in by EMS after she was found to be unresponsive by family member. She was apparently vomiting as well. She had taken 8 to 10 tablets of Valium because she could not sleep. She denied suicidal intent. Workup in the emergency department raised concern for aspiration pneumonia and also revealed masses in the lung. She was hospitalized for further management.

## 2023-06-28 NOTE — Progress Notes (Addendum)
Daily Progress Note   Patient Name: Yolanda Reeves       Date: 06/28/2023 DOB: 10/09/1945  Age: 78 y.o. MRN#: [id number] Attending Physician: Alberteen Sam, * Primary Care Physician: Pearline Cables, MD Admit Date: 06/22/2023  Reason for Consultation/Follow-up: Establishing goals of care  Patient Profile/HPI:   78 y.o. female with medical history significant of BEST syndrome with very decreased eyesight, hard of hearing, chronic pain, hyperlipidemia hypothyroidism tobacco abuse osteopenia, and anxiety depression, kidney stone who was brought in by EMS after she was found to be unresponsive by family member. She was apparently vomiting as well. She had taken 8 to 10 tablets of Valium because she could not sleep. She denied suicidal intent. Workup in the emergency department raised concern for aspiration pneumonia and also revealed masses in the lung. She was hospitalized for further management. The Palliative medicine team has been asked to get involved for goals of care in the setting of possible metastatic disease.   CT chest- multiple enlarged hilar lymph nodes, Lungs: large L apical mass 6.2x4.6.4.5cm, spiculated R upper lobe solid mass, left lower lobe nodule, 3 L upper lobe nodules CT abdomen- multiple small liver lesions in both lobes; kidney lesion,   Evaluated by psychiatry and recommendations have been made for inpatient psychiatric placement for untreated major depressive disorder, generalized anxiety depression related to hypothyroid. She had significant hypothyroidism with TSH of 66.5888 on admission.   Subjective: Chart reviewed including labs, progress notes, imaging from this and previous encounters.  Evaluated patient. She was awake, irritable, reported feeling very  tired. Resistant to interview and discussion.  Family reports that at baseline she is typically straightforward, but not generally rude or mean.  Sister [name] Kidney and nice Clydie Braun were at bedside.  Clydie Braun notes that prior to admission patient was physically strong, but her mental status has been significantly depressed since she lost her job.  I asked Lennox who prescribed her valium as no prescription can be located in her chart or prescribed database- she stated that Emerson Monte prescribed it for her. I attempted to assess how frequently patient was taking valium to evaluate concern for withdrawal- patient stated she was taking it around 2 times per week. Her niece Clydie Braun stated she was uncertain how reliable Miel's reporting is of her medication use. [name] Kidney and Clydie Braun  noted there were several bottles of valium in patients home as well as several other full bottles of medication.  There is a primary care note from 2022 where patient had requested a new psychiatrist. Dr. Nolen Mu is now practicing in [place].  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 However, they have not had a discussion specifically about what her feelings would be if she were diagnosed with cancer.  Discussed with family- at this time- I am hesitant to discuss goals of care related to cancer treatment with patient until her hypothyroidism, depression, and suicidal ideation has been adequately addressed. However, if after adequately treating these patient does express that she would not want chemotherapy or other treatment for cancer- then hospice would be appropriate if pathology results positive for cancer.   Review of Systems  Unable to perform ROS: Mental status change     Physical Exam Vitals and nursing note reviewed.  Constitutional:      General: She is not in acute distress. Cardiovascular:     Rate and  Rhythm: Normal rate.  Pulmonary:     Effort: Pulmonary effort is normal.  Neurological:     Mental Status: She is disoriented.  Psychiatric:     Comments: irritable             Vital Signs: BP (!) 146/76 (BP Location: Right Arm)   Pulse 67   Temp 98 F (36.7 C) (Oral)   Resp 18   Ht 5\' 2"  (1.575 m)   Wt 92.9 kg   SpO2 98%   BMI 37.48 kg/m  SpO2: SpO2: 98 % O2 Device: O2 Device: Room Air O2 Flow Rate: O2 Flow Rate (L/min): 3 L/min  Intake/output summary:  Intake/Output Summary (Last 24 hours) at 06/28/2023 1241 Last data filed at 06/28/2023 1229 Gross per 24 hour  Intake 550.77 ml  Output 2200 ml  Net -1649.23 ml   LBM: Last BM Date : 06/23/23 Baseline Weight: Weight: 98.4 kg Most recent weight: Weight: 92.9 kg           Patient Active Problem List   Diagnosis Date Noted   Class 3 obesity 06/28/2023   Normocytic anemia 06/28/2023   Severe episode of recurrent major depressive disorder, without psychotic features (HCC) 06/24/2023   Lung mass 06/23/2023   Benzodiazepine overdose, accidental or unintentional, initial encounter 06/22/2023   Aspiration pneumonia (HCC) 06/22/2023   Prolonged QT interval 06/22/2023   Nocturnal hypoxemia 08/10/2015   Obesity (BMI 30-39.9) 05/19/2013   Elevated BP 08/31/2012   Routine general medical examination at a health care facility 08/30/2012   Hyperlipidemia 01/16/2009   VITAMIN D DEFICIENCY 01/05/2009   UNSPECIFIED SUBJECTIVE VISUAL DISTURBANCE 01/05/2009   WART, RIGHT HAND 04/02/2008   HYPOTHYROIDISM, POST-RADIOACTIVE IODINE 04/02/2008   TOBACCO USE 04/02/2008   OSTEOPENIA 04/02/2008    Palliative Care Assessment & Plan    Assessment/Recommendations/Plan  Continue current treatment plan- respiratory status is stabilizing, await evaluation by Oncology Recommendations have been made for inpatient psychiatric placement- PMT will follow for pathology results, oncology recommendations and maximum treatment of depression  and hypothyroid before having further GOC discussion-     Code Status: DNR  Prognosis:  Unable to determine  Discharge Planning: To Be Determined  Care plan was discussed with patient and family.  Thank you for allowing the Palliative Medicine Team to assist in the care of this  patient.  Total time: 60 minutes Prolonged billing:  Time includes:   Preparing to see the patient (e.g., review of tests) Obtaining and/or reviewing separately obtained history Performing a medically necessary appropriate examination and/or evaluation Counseling and educating the patient/family/caregiver Ordering medications, tests, or procedures Referring and communicating with other health care professionals (when not reported separately) Documenting clinical information in the electronic or other health record Independently interpreting results (not reported separately) and communicating results to the patient/family/caregiver Care coordination (not reported separately) Clinical documentation  Ocie Bob, AGNP-C Palliative Medicine   Please contact Palliative Medicine Team phone at [phone] for questions and concerns.

## 2023-06-28 NOTE — Assessment & Plan Note (Signed)
Continue pravastatin 

## 2023-06-28 NOTE — Assessment & Plan Note (Signed)
BP controlled. Continue amlodipine.

## 2023-06-28 NOTE — Consult Note (Signed)
[facility] Consult Follow-Up  Patient Name: .Yolanda Reeves  MRN: [id number]  DOB: 01/31/1946  Consult Order details: Requested per pt's brother/depressed and anxious/accidental overdose/[place]  Mode of Visit: In person    Psychiatry Consult Evaluation  Service Date: June 28, 2023 LOS:  LOS: 3 days  Chief Complaint "I feel depressed because I'm hard of hearing, going blind, and unable to drive or go to the grocery store. I don't want to kill myself but I don't care if I die."   Primary Psychiatric Diagnoses  Major depressive disorder 2.  Generalized anxiety disorder 3.  Depression due to another medical condition (hypothyroidism).   Assessment  ROSHAWNDA Reeves is a 78 y.o. female admitted: Medicallyfor 06/22/2023  5:46 PM for . She carries the psychiatric diagnoses of Major depression and Anxiety and has a past medical history of chronic pain, hyperlipidemia, hypothyroidism, tobacco abuse, and osteopenia.   Her current presentation of depressed mood, lack of motivation, low energy level, hopelessness, helplessness, suicidal ideation and recent overdose is most consistent with Major depressive disorder. A diagnoses of Depression due to another medical condition can also be entertained due to the history of poorly treated  hypothyroidism (vs noncompliance w synthroid).  In a vacuum, She meets criteria for inpatient admission based on being severely depressed, ongoing suicidal ideation, being elderly, lives alone - however this admission has revealed a new cancer diagnosis and it is unclear if she will be returning to living alone. She has been persistently confused/delirious since admission. She does not remember the OD leading to admission; when she did remember it she denied on multiple occasions it was a suicide attempt.  Current outpatient psychotropic medications include Zoloft 150 mg daily, Lamotrigine 150 mg daily and Valium 5 mg daily for anxiety. Historically she has had  poor response to these medications. Adding medications has been challenging in the setting of qtc prolongation and bradycardia.  On initial examination, patient was calm and cooperative, she is alert and oriented to time, place, person and situation. She is now less oriented with poor attention. She no longer remembers the overdose that led to her admission. Patient's statements that she doesn't care if she lives or dies are congruent with long-standing values (father died of lung cancer despite aggressive treatment). Please see plan below for detailed recommendations.   Today 1/22 I am formally rescinding recommendation for inpatient psychiatry.   Diagnoses:  Active Hospital problems: Principal Problem:   Aspiration pneumonia (HCC) Active Problems:   HYPOTHYROIDISM, POST-RADIOACTIVE IODINE   Hyperlipidemia   Benzodiazepine overdose, accidental or unintentional, initial encounter   Prolonged QT interval   Lung mass   Severe episode of recurrent major depressive disorder, without psychotic features (HCC)   Class 3 obesity   Normocytic anemia   Essential hypertension    Plan   ## Psychiatric Medication Recommendations:  -Please wean patient off Valium due to risk of sedation, recurrent pneumonia, risk of fall in elderly, cognitive impairment, respiratory suppression, and worsening of depression as a CNS depressant.  -Continue Lamotrigine 150 mg daily - may dc if qtc still prolonged -- START lexapro 5 mg daily (zoloft ineffective)  -STOP Buspirone 5 mg three times daily for anxiety and up titrate as needed.  (Polypharmacy) 0 -Patient will benefit from inpatient psychiatric admission once she is medically cleared due to recent overdose.  -Consider TOC/Social worker consult to facilitate inpatient psychiatric transfer once medically stable.  -Synthroid increased by primary; this should help to treat depression.   ##  Medical Decision Making Capacity: Not specifically addressed in this  encounter  -- most recent EKG on 1/19 had qtc of 519 in setting of sinus brady - repeat 1/22 nl now that out of bradycardia Labs earlier this admission includes: HB-11.2, TSH-66.58, T4-0.34  ## Disposition:-- Pt persistently delirious, with a new life-limiting cancer diagnosis and decreased functional mobility. Do not feel she would benefit from inpt psychiatry at this time.   ## Behavioral / Environmental: - No specific recommendations at this time.     ## Safety and Observation Level:  - Based on my clinical evaluation, I estimate the patient to be at low-moderate risk of self harm in the current setting. - At this time, we recommend telesitter . This decision is based on my review of the chart including patient's history and current presentation, interview of the patient, mental status examination, and consideration of suicide risk including evaluating suicidal ideation, plan, intent, suicidal or self-harm behaviors, risk factors, and protective factors. This judgment is based on our ability to directly address suicide risk, implement suicide prevention strategies, and develop a safety plan while the patient is in the clinical setting. Please contact our team if there is a concern that risk level has changed.  CSSR Risk Category:C-SSRS RISK CATEGORY: No Risk  Suicide Risk Assessment: Patient has following modifiable risk factors for suicide: active suicidal ideation, under treated depression , and social isolation, which we are addressing by synthroid increase, potential referral to SNF.  Patient has following non-modifiable or demographic risk factors for suicide: separation or divorce Patient has the following protective factors against suicide: Supportive family and Pets in the home  Thank you for this consult request. Recommendations have been communicated to the primary team.  We will follow up at this time.   Bryley Kovacevic A Toni Hoffmeister       History of Present Illness  Relevant Aspects  of Hospital Hospital Course:  Admitted on 06/22/2023 after she was found unconscious after a non-suicidal OD  on Valium. Patient is 78 year old female with medical history significant of chronic pain, hyperlipidemia, hypothyroidism, tobacco abuse, osteopenia, anxiety, depression, kidney stone who was brought in by EMS after she was found to be unresponsive by family member. She was apparently vomiting as well. She had taken 8 to 10 tablets of Valium 5 mg pills because she could not sleep. She denied suicidal intent. Workup in the emergency department raised concern for aspiration pneumonia and also revealed masses in the lung.She is being worked up for now.   Patient Report:  Pt seen in mid- to late- afternoon. Partially oriented with poor attention (skipped multiple numbers counting backward by ones).  She states today is bad (actually made a raspberry blowing sound). She continues to have no recollection of how she got in the hospital; knows she is being treated here for cancer and pneumonia. Does not think her medications for depression were working at all and doubts she was taking them (now several days into lamictal). She denied any suicidal thoughts but endorses fairly significant depressive symptoms. No AH/VH/HI.   Collateral Today 1/22 I met with pt's sister and niece (she has no legal children - adopted out a child many years ago). I explained the original recommendation for inpatient psychiatry, and what a hospitalization would actually look like (short-term, not addressing palliative or oncology needs, etc). They do not feel she would benefit from milieu at the best of times (very proud/would not engage in groups) and, while appropriately concerned for her  mental health regarding several recent statements, are viewing this thorough the lens of a new cancer diagnosis rather than acute suicidality. We discussed changes to medication, pursuing outpt therapy, and ensuring a safe environment - pt's house  with significant clutter ("hoarding") and animal excrements. Will continue to work with family as we know what dc will look like (ie to SNF vs needing HH, help with pills, etc).   Psych ROS:  Depression: depressed mood, lack of motivation, low energy level, fatigue, hopelessness, helplessness, poor sleep, suicide ideations.  Anxiety: excessive anxiety, irritability, apprehensive, and nervousness.  Mania (lifetime and current): denies  Psychosis: (lifetime and current): occasional visual hallucinations but denies auditory hallucinations and delusions.   Collateral information:  Called sister - she was unaware that inpt psych had been recommended. She is mostly worried about the confusion and the cancer at this point. Pt did make an offhand comment to brother about wanting a bullet but has otherwise denied suicidality at all times.   Review of Systems  Psychiatric/Behavioral:  Positive for depression. The patient is nervous/anxious.   All other systems reviewed and are negative.    Psychiatric and Social History  Psychiatric History:  Information collected from the patient   Prev Dx/Sx: Major depression and Anxiety Current Psych Provider: None Home Meds (current): Zoloft 150 mg daily, Lamotrigine 150 mg daily and Valium 5 mg daily for anxiety.  Previous Med Trials: None  Therapy: denies   Prior Psych Hospitalization: denies   Prior Self Harm: denies Prior Violence: denies   Family Psych History: denies  Family Hx suicide: denies   Social History:  Educational Hx: high school graduate Occupational Hx: unemployed, retired.  Legal Hx: denies  Living Situation: lives alone in her apartment  Spiritual Hx: unknown  Access to weapons/lethal means: denies    Substance History Alcohol: denies   Type of alcohol former smoker, but currently vape Nicotine.  Last Drink unknown  Number of drinks per day N/A History of alcohol withdrawal seizures denies  History of DT's denies   Tobacco: former smoker  Illicit drugs: denies  Prescription drug abuse: denies  Rehab hx: denies   Exam Findings  Physical Exam: General appearance: Awake alert.  In no distress Resp: Coarse breath sounds with few crackles at the bases.  Occasional scattered wheezing.  No rhonchi. Cardio: S1-S2 is normal regular.  No S3-S4.  No rubs murmurs or bruit GI: Abdomen is soft.  Nontender nondistended.  Bowel sounds are present normal.  No masses organomegaly Extremities: No edema.  Some physical deconditioning is noted Neurologic: Alert and oriented x3.  No focal neurological deficits.   Vital Signs:  Temp:  [97.8 F (36.6 C)-98 F (36.7 C)] 97.8 F (36.6 C) (01/22 1603) Pulse Rate:  [67-70] 70 (01/22 1603) Resp:  [18-20] 20 (01/22 1603) BP: (122-146)/(67-83) 130/83 (01/22 1603) SpO2:  [92 %-98 %] 94 % (01/22 1603) Blood pressure 130/83, pulse 70, temperature 97.8 F (36.6 C), temperature source Oral, resp. rate 20, height 5\' 2"  (1.575 m), weight 92.9 kg, SpO2 94%. Body mass index is 37.48 kg/m.  Physical Exam Vitals and nursing note reviewed.  Constitutional:      Appearance: Normal appearance.  HENT:     Head: Normocephalic.     Nose: Nose normal.  Pulmonary:     Effort: Pulmonary effort is normal.  Musculoskeletal:     Cervical back: Normal range of motion.  Neurological:     General: No focal deficit present.     Mental Status:  She is alert and oriented to person, place, and time.     Mental Status Exam: General Appearance: Casual and Well Groomed  Orientation:  Other:  Partial  Memory:  Recent;   Poor  Concentration:  Concentration: Fair  Recall:  Fair  Attention  Poor  Eye Contact:  Fair  Speech:  Normal Rate  Language:  Good  Volume:  Decreased  Mood: *blows raspberry*  Affect:  Congruent  Thought Process:  Linear  Thought Content:  Logical  Suicidal Thoughts:  No  Homicidal Thoughts:  No  Judgement:  Poor  Insight:  Lacking  Psychomotor Activity:   Decreased  Akathisia:  No  Fund of Knowledge:  Good      Assets:  Communication Skills Social Support  Cognition:  WNL  ADL's:  Impaired  AIMS (if indicated):        Other History   These have been pulled in through the EMR, reviewed, and updated if appropriate.  Family History:  The patient's family history includes Cancer in her brother; Colon cancer in her paternal grandmother; Prostate cancer in her brother; Skin cancer in her sister; Thyroid cancer in her brother.  Medical History: Past Medical History:  Diagnosis Date   Best dystrophy 2012   Chest pain, atypical    Heart murmur    HYPERLIPIDEMIA    HYPOTHYROIDISM, POST-RADIOACTIVE IODINE    OSTEOPENIA    Sleep apnea    TOBACCO USE    UNSPECIFIED SUBJECTIVE VISUAL DISTURBANCE    VITAMIN D DEFICIENCY    WART, RIGHT HAND     Surgical History: Past Surgical History:  Procedure Laterality Date   ABDOMINAL HYSTERECTOMY     CARPAL TUNNEL RELEASE     EXTRACORPOREAL SHOCK WAVE LITHOTRIPSY Right 10/15/2020   Procedure: EXTRACORPOREAL SHOCK WAVE LITHOTRIPSY (ESWL);  Surgeon: Alfredo Martinez, MD;  Location: [facility];  Service: Urology;  Laterality: Right;   EXTRACORPOREAL SHOCK WAVE LITHOTRIPSY Right 11/19/2020   Procedure: EXTRACORPOREAL SHOCK WAVE LITHOTRIPSY (ESWL);  Surgeon: Jerilee Field, MD;  Location: [facility];  Service: Urology;  Laterality: Right;   EYE SURGERY     LAPAROSCOPIC OOPHERECTOMY     LITHOTRIPSY     TONSILLECTOMY       Medications:   Current Facility-Administered Medications:    acetaminophen (TYLENOL) tablet 650 mg, 650 mg, Oral, Q6H PRN, 650 mg at 06/28/23 0505 **OR** acetaminophen (TYLENOL) suppository 650 mg, 650 mg, Rectal, Q6H PRN, Doutova, Anastassia, MD   albuterol (PROVENTIL) (2.5 MG/3ML) 0.083% nebulizer solution 2.5 mg, 2.5 mg, Nebulization, Q2H PRN, Doutova, Anastassia, MD, 2.5 mg at 06/28/23 0132   amLODipine (NORVASC) tablet 5 mg, 5 mg, Oral,  Daily, Osvaldo Shipper, MD, 5 mg at 06/28/23 0932   amoxicillin-clavulanate (AUGMENTIN) 875-125 MG per tablet 1 tablet, 1 tablet, Oral, Q12H, Osvaldo Shipper, MD, 1 tablet at 06/28/23 0925   escitalopram (LEXAPRO) tablet 5 mg, 5 mg, Oral, Daily, Dempsey Knotek A   guaiFENesin (MUCINEX) 12 hr tablet 600 mg, 600 mg, Oral, BID, Doutova, Anastassia, MD, 600 mg at 06/28/23 5621   lamoTRIgine (LAMICTAL) tablet 150 mg, 150 mg, Oral, Daily, Osvaldo Shipper, MD, 150 mg at 06/28/23 3086   levothyroxine (SYNTHROID) tablet 150 mcg, 150 mcg, Oral, QAC breakfast, Doutova, Anastassia, MD, 150 mcg at 06/28/23 0505   nicotine (NICODERM CQ - dosed in mg/24 hours) patch 14 mg, 14 mg, Transdermal, Daily, Osvaldo Shipper, MD, 14 mg at 06/28/23 0926   polyethylene glycol (MIRALAX / GLYCOLAX) packet 17 g, 17 g,  Oral, Daily PRN, Osvaldo Shipper, MD, 17 g at 06/27/23 2124   pravastatin (PRAVACHOL) tablet 40 mg, 40 mg, Oral, q1800, Doutova, Anastassia, MD, 40 mg at 06/27/23 1749   predniSONE (DELTASONE) tablet 20 mg, 20 mg, Oral, Q breakfast, Osvaldo Shipper, MD, 20 mg at 06/28/23 1610   senna-docusate (Senokot-S) tablet 2 tablet, 2 tablet, Oral, BID, Osvaldo Shipper, MD, 2 tablet at 06/28/23 9604  Allergies: No Known Allergies  Claris Che A Wen [place]

## 2023-06-28 NOTE — Progress Notes (Signed)
Physical Therapy Treatment Patient Details Name: Yolanda Reeves MRN: [id number] DOB: 01-09-1946 Today's Date: 06/28/2023   History of Present Illness 78  yo female admitted with unintentional benzodiazepine overdose. Biopsy 1/21. Fall while in hospital. PMH: hyperlipidemia, hypothyroidism, chronic pain, HOH, legally blind, and anxiety.    PT Comments  With encouragement from family and PT/OT, pt agreeable to participate. She was able to ambulate to and from bathroom with Min A and use of a RW. Pt with poor safety awareness, poor insight. Family is supportive. Pt lives alone. Unfortunately, she doesn't demonstrate the ability to safely manage her care at home alone. Patient will benefit from continued inpatient follow up therapy, <3 hours/day.     If plan is discharge home, recommend the following: A little help with walking and/or transfers;A little help with bathing/dressing/bathroom;Assistance with cooking/housework;Assist for transportation;Help with stairs or ramp for entrance   Can travel by private vehicle        Equipment Recommendations  None recommended by PT    Recommendations for Other Services       Precautions / Restrictions Precautions Precautions: Fall Precaution Comments: legally blind; very HOH Restrictions Weight Bearing Restrictions Per Provider Order: No     Mobility  Bed Mobility Overal bed mobility: Needs Assistance Bed Mobility: Supine to Sit     Supine to sit: Min assist     General bed mobility comments: Increased time and encouragement required. Min A to help encourage pt-could likely perform task unassisted if motivated to    Transfers Overall transfer level: Needs assistance Equipment used: Rolling walker (2 wheels) Transfers: Sit to/from Stand Sit to Stand: Min assist, From elevated surface           General transfer comment: Assist to rise, steady, control descent. Cues for safety, hand placement. Increased time.     Ambulation/Gait Ambulation/Gait assistance: Min assist Gait Distance (Feet): 15 Feet (x2) Assistive device: Rolling walker (2 wheels) Gait Pattern/deviations: Step-through pattern, Decreased stride length       General Gait Details: Assist to stabilize pt and manage RW-possibly due to visual deficits. Cues provided as needed. Pt tolerated distance well. She declined hallway ambulation on today.   Stairs             Wheelchair Mobility     Tilt Bed    Modified Rankin (Stroke Patients Only)       Balance Overall balance assessment: Needs assistance, History of Falls         Standing balance support: Reliant on assistive device for balance, During functional activity, Bilateral upper extremity supported Standing balance-Leahy Scale: Poor                              Cognition Arousal: Alert Behavior During Therapy: Flat affect Overall Cognitive Status: Impaired/Different from baseline Area of Impairment: Problem solving, Memory, Following commands, Safety/judgement                     Memory: Decreased short-term memory   Safety/Judgement: Decreased awareness of safety   Problem Solving: Decreased initiation, Difficulty sequencing, Requires verbal cues, Requires tactile cues General Comments: not following commands during session today        Exercises      General Comments        Pertinent Vitals/Pain Pain Assessment Pain Assessment: Faces Faces Pain Scale: Hurts even more Pain Location: buttocks Pain Descriptors / Indicators: Grimacing Pain Intervention(s): Monitored during session,  Limited activity within patient's tolerance, Repositioned    Home Living                          Prior Function            PT Goals (current goals can now be found in the care plan section) Progress towards PT goals: Progressing toward goals    Frequency    Min 1X/week      PT Plan      Co-evaluation               AM-PAC PT "6 Clicks" Mobility   Outcome Measure  Help needed turning from your back to your side while in a flat bed without using bedrails?: A Little Help needed moving from lying on your back to sitting on the side of a flat bed without using bedrails?: A Little Help needed moving to and from a bed to a chair (including a wheelchair)?: A Little Help needed standing up from a chair using your arms (e.g., wheelchair or bedside chair)?: A Little Help needed to walk in hospital room?: A Little Help needed climbing 3-5 steps with a railing? : A Lot 6 Click Score: 17    End of Session Equipment Utilized During Treatment: Gait belt Activity Tolerance: Patient tolerated treatment well Patient left: in bed;with call bell/phone within reach;with bed alarm set;with family/visitor present   PT Visit Diagnosis: Muscle weakness (generalized) (M62.81);Difficulty in walking, not elsewhere classified (R26.2)     Time: 1445-1520 PT Time Calculation (min) (ACUTE ONLY): 35 min  Charges:    $Gait Training: 8-22 mins $Therapeutic Activity: 8-22 mins PT General Charges $$ ACUTE PT VISIT: 1 Visit                        Faye Ramsay, PT Acute Rehabilitation  Office: [phone]

## 2023-06-28 NOTE — Progress Notes (Signed)
Will follow peripherally  Will follow-up on cytology from biopsy

## 2023-06-29 DIAGNOSIS — C3412 Malignant neoplasm of upper lobe, left bronchus or lung: Secondary | ICD-10-CM | POA: Diagnosis not present

## 2023-06-29 DIAGNOSIS — K769 Liver disease, unspecified: Secondary | ICD-10-CM

## 2023-06-29 DIAGNOSIS — T424X1A Poisoning by benzodiazepines, accidental (unintentional), initial encounter: Secondary | ICD-10-CM | POA: Diagnosis not present

## 2023-06-29 DIAGNOSIS — F332 Major depressive disorder, recurrent severe without psychotic features: Secondary | ICD-10-CM | POA: Diagnosis not present

## 2023-06-29 DIAGNOSIS — Z515 Encounter for palliative care: Secondary | ICD-10-CM

## 2023-06-29 DIAGNOSIS — J690 Pneumonitis due to inhalation of food and vomit: Secondary | ICD-10-CM | POA: Diagnosis not present

## 2023-06-29 DIAGNOSIS — R918 Other nonspecific abnormal finding of lung field: Secondary | ICD-10-CM | POA: Diagnosis not present

## 2023-06-29 DIAGNOSIS — E018 Other iodine-deficiency related thyroid disorders and allied conditions: Secondary | ICD-10-CM | POA: Diagnosis not present

## 2023-06-29 DIAGNOSIS — E66813 Obesity, class 3: Secondary | ICD-10-CM | POA: Diagnosis not present

## 2023-06-29 LAB — COMPREHENSIVE METABOLIC PANEL
ALT: 12 U/L (ref 0–44)
AST: 13 U/L — ABNORMAL LOW (ref 15–41)
Albumin: 3.1 g/dL — ABNORMAL LOW (ref 3.5–5.0)
Alkaline Phosphatase: 46 U/L (ref 38–126)
Anion gap: 9 (ref 5–15)
BUN: 25 mg/dL — ABNORMAL HIGH (ref 8–23)
CO2: 23 mmol/L (ref 22–32)
Calcium: 8.5 mg/dL — ABNORMAL LOW (ref 8.9–10.3)
Chloride: 104 mmol/L (ref 98–111)
Creatinine, Ser: 0.91 mg/dL (ref 0.44–1.00)
GFR, Estimated: >60 mL/min (ref >60)
Glucose, Bld: 83 mg/dL (ref 70–99)
Potassium: 3.8 mmol/L (ref 3.5–5.1)
Sodium: 136 mmol/L (ref 135–145)
Total Bilirubin: 0.7 mg/dL (ref 0.0–1.2)
Total Protein: 6.5 g/dL (ref 6.5–8.1)

## 2023-06-29 LAB — CBC
HCT: 37.5 % (ref 36.0–46.0)
Hemoglobin: 11.8 g/dL — ABNORMAL LOW (ref 12.0–15.0)
MCH: 29.9 pg (ref 26.0–34.0)
MCHC: 31.5 g/dL (ref 30.0–36.0)
MCV: 94.9 fL (ref 80.0–100.0)
Platelets: 154 10*3/uL (ref 150–400)
RBC: 3.95 MIL/uL (ref 3.87–5.11)
RDW: 15.4 % (ref 11.5–15.5)
WBC: 7.1 10*3/uL (ref 4.0–10.5)
nRBC: 0 % (ref 0.0–0.2)

## 2023-06-29 MED ORDER — ACETAMINOPHEN 325 MG PO TABS
650.0000 mg | ORAL_TABLET | Freq: Three times a day (TID) | ORAL | Status: DC
  Administered 2023-06-29 – 2023-07-08 (×27): 650 mg via ORAL
  Filled 2023-06-29 (×27): qty 2

## 2023-06-29 NOTE — Consult Note (Addendum)
[facility] CONSULT NOTE  Patient Care Team: Copland, Gwenlyn Found, MD as PCP - General (Family Medicine) Dohmeier, Porfirio Mylar, MD as Consulting Physician (Neurology) Delton Coombes Les Pou, MD as Consulting Physician (Pulmonary Disease)  CHIEF COMPLAINTS/PURPOSE OF CONSULTATION:  Lung cancer with mets to the liver  REFERRING PHYSICIAN: Dr. Maryfrances Bunnell  HISTORY OF PRESENTING ILLNESS:  Yolanda Reeves 78 y.o. female who was brought into the ED on 06/22/2023 with complaints of unintentional Valium overdose.  Patient had been unresponsive and vomiting prior to arrival per the family.   Workup was done in the ED which included CT angio of the chest and showed a large left apical lung mass concerning for malignancy with lymph nodes suspicious for metastatic disease.  Therefore oncology consult has been requested. Patient is seen today awake and alert sitting up in chair eating very well.  Patient's sister and niece are at the bedside and have provided most of patient's medical history.  She is a very nice lady.  Indeed she endorses nausea for several months and taking Valium for generalized nonspecific pain.  Denies chest pain, bleeding, or other acute symptoms.  Medical history includes kidney stones on 3 separate occasions, a thyroid issue patient states has "toxic nodules" for which she had radiation.  She also endorses a history of right-sided ovarian cancer. Surgical history includes hysterectomy several years ago. Family history significant for father with lung cancer, paternal grandmother with colon cancer, sister with skin cancer, niece with thyroid cancer. Social history includes tobacco use from age 38 to around age 89 pack/day, subsequently started vaping and states she quit 1 week ago.  Patient lives alone with her 2 dogs which she states she tries to walk them twice a day, family unsure if patient is actually ambulating as much as she states. Patient's sister is the decision-maker.    I  have reviewed her chart and materials related to her cancer extensively and collaborated history with the patient. Summary of oncologic history is as follows: Oncology History   No history exists.    ASSESSMENT & PLAN:   1.  Lung mass with mets to liver, lymph nodes, renal, adrenal - Newly diagnosed - CT angio chest done 06/22/2023 shows large left apical lung mass measuring 6.2 cm concerning for lung carcinoma.  Additional solid mass right upper lobe measuring 2.4 which may represent metastatic lesion.  Multiple lymph nodes suspicious for metastatic disease. - CT of abdomen pelvis shows multiple aggressive appearing liver lesions concerning for metastatic disease.  Renal lesions also seen which could be metastatic versus primary lesion.  Bilateral age renal nodules also noted. - Liver biopsy done 06/27/2023 with path confirming metastatic squamous cell carcinoma. - Medical oncology/Dr. Arbutus Ped will follow patient.  Further treatment recommendations will be discussed with patient, family and Dr. Arbutus Ped.  Patient's niece has stated that patient may not want treatment.  Goals of care discussions will be initiated.  2.  Benzodiazepine overdose, accidental - Benzodiazepines are on hold at this time - Monitor closely  3.  Anemia - Mild - Likely multifactorial due to malignancy, medication related - Hemoglobin 11.8 today - Monitor CBC with differential closely  4.  Hypothyroidism  - Status post radioactive iodine  - On Synthroid at home, continue per orders - Monitor thyroid levels closely  5.  Major depressive disorder - Long history of depression - Monitor closely - Psych eval appreciated    Orders Placed This Encounter  Procedures   Expectorated Sputum Assessment w Gram Stain,  Rflx to Resp Cult    Standing Status:   Standing    Number of Occurrences:   1   Culture, blood (routine x 2) Call MD if unable to obtain prior to antibiotics being given    If blood cultures drawn in  Emergency Department - Do not draw and cancel order    Standing Status:   Standing    Number of Occurrences:   2   Culture, Respiratory w Gram Stain    Standing Status:   Standing    Number of Occurrences:   1   DG Chest Port 1 View    Standing Status:   Standing    Number of Occurrences:   1    Reason for Exam (SYMPTOM  OR DIAGNOSIS REQUIRED):   sob   CT Angio Chest Pulmonary Embolism (PE) W or WO Contrast    Standing Status:   Standing    Number of Occurrences:   1    Does the patient have a contrast media/X-ray dye allergy?:   No    If indicated for the ordered procedure, I authorize the administration of contrast media per Radiology protocol:   Yes    Release to patient:   Immediate   CT ABDOMEN PELVIS W CONTRAST    Standing Status:   Standing    Number of Occurrences:   1    Does the patient have a contrast media/X-ray dye allergy?:   No    If indicated for the ordered procedure, I authorize the administration of contrast media per Radiology protocol:   Yes    If indicated for the ordered procedure, I authorize the administration of oral contrast media per Radiology protocol:   Yes   MR BRAIN W WO CONTRAST    Standing Status:   Standing    Number of Occurrences:   1    If indicated for the ordered procedure, I authorize the administration of contrast media per Radiology protocol:   Yes    What is the patient's sedation requirement?:   No Sedation    Does the patient have a pacemaker or implanted devices?:   No   MR ABDOMEN W WO CONTRAST    Standing Status:   Standing    Number of Occurrences:   1    If indicated for the ordered procedure, I authorize the administration of contrast media per Radiology protocol:   Yes    What is the patient's sedation requirement?:   No Sedation    Does the patient have a pacemaker or implanted devices?:   No   US BIOPSY (LIVER)    CC; DR Osvaldo Shipper    Standing Status:   Standing    Number of Occurrences:   1    Lab orders requested (DO  NOT place separate lab orders, these will be automatically ordered during procedure specimen collection)::   Surgical Pathology    Can the patient sign their own consent?:   Yes    Symptom/Reason for Exam:   Liver lesion [[id number]]   US Abdomen Limited RUQ (LIVER/GB)    Evaluation of liver lesion biopsy    Standing Status:   Standing    Number of Occurrences:   1    Symptom/Reason for Exam:   Liver lesion [[id number]]   CBC with Differential/Platelet    Standing Status:   Standing    Number of Occurrences:   1   Comprehensive metabolic panel    Standing Status:   Standing  Number of Occurrences:   1   Ethanol    Standing Status:   Standing    Number of Occurrences:   1   Rapid urine drug screen (hospital performed)    Standing Status:   Standing    Number of Occurrences:   1   Acetaminophen level    Standing Status:   Standing    Number of Occurrences:   1   Salicylate level    Standing Status:   Standing    Number of Occurrences:   1   Procalcitonin    Standing Status:   Standing    Number of Occurrences:   1   Blood gas, venous    Standing Status:   Standing    Number of Occurrences:   1    Release to patient:   Immediate   CK    Standing Status:   Standing    Number of Occurrences:   1   Magnesium    Standing Status:   Standing    Number of Occurrences:   1   Phosphorus    Standing Status:   Standing    Number of Occurrences:   1   Vitamin B1    Standing Status:   Standing    Number of Occurrences:   1   Prealbumin    Standing Status:   Standing    Number of Occurrences:   1   TSH    Standing Status:   Standing    Number of Occurrences:   1   HIV Antibody (routine testing w rflx)    Standing Status:   Standing    Number of Occurrences:   1   Legionella Pneumophila Serogp 1 Ur Ag    Standing Status:   Standing    Number of Occurrences:   1   Strep pneumoniae urinary antigen    Standing Status:   Standing    Number of Occurrences:   1   Magnesium    Standing  Status:   Standing    Number of Occurrences:   1   Phosphorus    Standing Status:   Standing    Number of Occurrences:   1   Comprehensive metabolic panel    Standing Status:   Standing    Number of Occurrences:   1    Release to patient:   Immediate   CBC    Standing Status:   Standing    Number of Occurrences:   1    Release to patient:   Immediate   T4, free    Standing Status:   Standing    Number of Occurrences:   1   T3    Standing Status:   Standing    Number of Occurrences:   1   Protime-INR    Standing Status:   Standing    Number of Occurrences:   1   CBC    Standing Status:   Standing    Number of Occurrences:   1   Basic metabolic panel    Standing Status:   Standing    Number of Occurrences:   1   CBC    Standing Status:   Standing    Number of Occurrences:   1   Comprehensive metabolic panel    Standing Status:   Standing    Number of Occurrences:   1   Magnesium    Standing Status:   Standing    Number of  Occurrences:   1   Urinalysis, Routine w reflex microscopic -Urine, Catheterized    IO    Standing Status:   Standing    Number of Occurrences:   1    Specimen Source:   Urine, Catheterized [36]   CBC    Standing Status:   Standing    Number of Occurrences:   1   Basic metabolic panel    Standing Status:   Standing    Number of Occurrences:   1   CBC    Standing Status:   Standing    Number of Occurrences:   1   Comprehensive metabolic panel    Standing Status:   Standing    Number of Occurrences:   1   Diet regular Room service appropriate? Yes; Fluid consistency: Thin    Standing Status:   Standing    Number of Occurrences:   1    Room service appropriate?:   Yes    Fluid consistency::   Thin   Apply Pneumonia Care Plan    Standing Status:   Standing    Number of Occurrences:   1   SCDs    Standing Status:   Standing    Number of Occurrences:   1    Laterality:   Bilateral   Patient has an active order for admit to inpatient/place in  observation    Standing Status:   Standing    Number of Occurrences:   1   Vital signs    Standing Status:   Standing    Number of Occurrences:   1   Notify physician (specify)    Standing Status:   Standing    Number of Occurrences:   20    Notify Physician:   for pulse less than 55 or greater than 120    Notify Physician:   for respiratory rate less than 12 or greater than 25    Notify Physician:   for temperature greater than 100.5 F    Notify Physician:   for urinary output less than 30 mL/hr for four hours    Notify Physician:   for systolic BP less than 90 or greater than 160, diastolic BP less than 60 or greater than 100   Mobility Protocol: No Restrictions    RN to initiate protocols based on patient's level of care    Standing Status:   Standing    Number of Occurrences:   1   Refer to Sidebar Report Refer to ICU, Med-Surg, Progressive, and Step-Down Mobility Protocol Sidebars    Refer to ICU, Med-Surg, Progressive, and Step-Down Mobility Protocol Sidebars    Standing Status:   Standing    Number of Occurrences:   1   Initiate Adult Central Line Maintenance and Catheter Protocol for patients with central line (CVC, PICC, Port, Hemodialysis, Trialysis)    Standing Status:   Standing    Number of Occurrences:   1   If patient diabetic or glucose greater than 140 notify physician for Sliding Scale Insulin Orders    Standing Status:   Standing    Number of Occurrences:   20   Oral care per nursing protocol    Standing Status:   Standing    Number of Occurrences:   1   Initiate Oral Care Protocol    Standing Status:   Standing    Number of Occurrences:   1   Initiate Carrier Fluid Protocol    Standing Status:   Standing  Number of Occurrences:   1   Assess    RN will perform and document a focused respiratory assessment: respiratory rate, auscultating of lungs, and observation of breathing patterns.    Standing Status:   Standing    Number of Occurrences:   1   Check  Pulse Oximetry while ambulating    Standing Status:   Standing    Number of Occurrences:   1   Place order for blood cultures x 2 (from different sites) for Temp > 101F    Standing Status:   Standing    Number of Occurrences:   1   RN may order General Admission PRN Orders utilizing "General Admission PRN medications" (through manage orders) for the following patient needs: allergy symptoms (Claritin), cold sores (Carmex), cough (Robitussin DM), eye irritation (Liquifilm Tears), hemorrhoids (Tucks), indigestion (Maalox), minor skin irritation (Hydrocortisone Cream), muscle pain Romeo Apple Gay), nose irritation (saline nasal spray) and sore throat (Chloraseptic spray).    Standing Status:   Standing    Number of Occurrences:   [phone]   Discontinue cardiac monitoring    Standing Status:   Standing    Number of Occurrences:   1   Face-to-face encounter (required for Medicare/Medicaid patients)    I Osvaldo Shipper certify that this patient is under my care and that I, or a nurse practitioner or physician's assistant working with me, had a face-to-face encounter that meets the physician face-to-face encounter requirements with th is patient on 06/26/2023. The encounter with the patient was in whole, or in part for the following medical condition(s) which is the primary reason for home health care (List medical condition): physical deconditioning    Standing Status:   Standing    Number of Occurrences:   1    The encounter with the patient was in whole, or in part, for the following medical condition, which is the primary reason for home health care:   physical deconditioning    I certify that, based on my findings, the following services are medically necessary home health services:   Physical therapy    Reason for Medically Necessary Home Health Services:   Therapy- Therapeutic Exercises to Increase Strength and Endurance    My clinical findings support the need for the above services:   Unsafe ambulation  due to balance issues    Further, I certify that my clinical findings support that this patient is homebound due to::   Unsafe ambulation due to balance issues   Home Health    Standing Status:   Standing    Number of Occurrences:   1    To provide the following care/treatments:   OT    To provide the following care/treatments:   PT   Patient has an active order for code status: continue current code status order    Standing Status:   Standing    Number of Occurrences:   1   Vital signs every 15 minutes x 4, every 30 minutes x 2, then per unit routine    Standing Status:   Standing    Number of Occurrences:   1   Monitor biopsy site every 15 minutes x 4, every 30 minutes x 2, then per unit routine    Standing Status:   Standing    Number of Occurrences:   1   Contact Interventional Radiologist PA or IR on call at [phone]    A. Increasing shortness of breath. B. Increasing pain at the biopsy site. C.  Tachycardia (pulse greater than 100 for two (2) consecutive readings). D. Hypotension (systolic blood pressure less than 100 mmHg for two (2) consecutive readings, or E. Other obvious patient distress.    Standing Status:   Standing    Number of Occurrences:   1   Safety Observation    Standing Status:   Standing    Number of Occurrences:   1    Patient behavior assessment:   High fall risk    Patient behavior assessment:   Confused    Patient behavior assessment:   Suspicious behaviors    Comments:   Psych recommendation    Select physical/physiological needs that have been assessed and addressed:   Electrolyte/fluid imbalance    Select physical/physiological needs that have been assessed and addressed:   Environmental- too hot/cold    Select physical/physiological needs that have been assessed and addressed:   Oxygenation    Select physical/physiological needs that have been assessed and addressed:   Pain    Select physical/physiological needs that have been assessed and  addressed:   Missing eye glasses/hearing aid    Select physical/physiological needs that have been assessed and addressed:   Medication reaction    Select physical/physiological needs that have been assessed and addressed:   Hunger/thirst    Select physical/physiological needs that have been assessed and addressed:   Bowel/Bladder    Select physical/physiological needs that have been assessed and addressed:   Drug/Alcohol withdrawal    Select interventions that have been implemented:   Bed Alarm Activated    Select interventions that have been implemented:   Increase rounding/toileting    Select interventions that have been implemented:   Patient closer to station    Select interventions that have been implemented:   Redirect patient    Select interventions that have been implemented:   Safety Rounder    Staff nurse/charge nurse to reassess safety requirement every 12 hours:   2 = Medium (increased aggression, frequent reminders, redirectable)    Has mobile tele-monitoring been trialed:   Not yet trialed    Type of observation:   Tele-monitoring   Safety Observation    Standing Status:   Standing    Number of Occurrences:   1    Patient behavior assessment:   High fall risk    Patient behavior assessment:   Confused    Patient behavior assessment:   Suspicious behaviors    Comments:   Psych recommendation    Select physical/physiological needs that have been assessed and addressed:   Electrolyte/fluid imbalance    Select physical/physiological needs that have been assessed and addressed:   Environmental- too hot/cold    Select physical/physiological needs that have been assessed and addressed:   Oxygenation    Select physical/physiological needs that have been assessed and addressed:   Pain    Select physical/physiological needs that have been assessed and addressed:   Missing eye glasses/hearing aid    Select physical/physiological needs that have been assessed and addressed:   Medication  reaction    Select physical/physiological needs that have been assessed and addressed:   Hunger/thirst    Select physical/physiological needs that have been assessed and addressed:   Bowel/Bladder    Select physical/physiological needs that have been assessed and addressed:   Drug/Alcohol withdrawal    Select interventions that have been implemented:   Bed Alarm Activated    Select interventions that have been implemented:   Increase rounding/toileting    Select interventions that have  been implemented:   Patient closer to station    Select interventions that have been implemented:   Redirect patient    Select interventions that have been implemented:   Safety Rounder    Staff nurse/charge nurse to reassess safety requirement every 12 hours:   2 = Medium (increased aggression, frequent reminders, redirectable)    Has mobile tele-monitoring been trialed:   Not yet trialed    Type of observation:   Tele-monitoring   Safety Observation    Standing Status:   Standing    Number of Occurrences:   1    Patient behavior assessment:   High fall risk    Patient behavior assessment:   Confused    Patient behavior assessment:   Suspicious behaviors    Comments:   Psych recommendation    Select physical/physiological needs that have been assessed and addressed:   Electrolyte/fluid imbalance    Select physical/physiological needs that have been assessed and addressed:   Environmental- too hot/cold    Select physical/physiological needs that have been assessed and addressed:   Oxygenation    Select physical/physiological needs that have been assessed and addressed:   Pain    Select physical/physiological needs that have been assessed and addressed:   Missing eye glasses/hearing aid    Select physical/physiological needs that have been assessed and addressed:   Medication reaction    Select physical/physiological needs that have been assessed and addressed:   Hunger/thirst    Select physical/physiological  needs that have been assessed and addressed:   Bowel/Bladder    Select physical/physiological needs that have been assessed and addressed:   Drug/Alcohol withdrawal    Select interventions that have been implemented:   Bed Alarm Activated    Select interventions that have been implemented:   Increase rounding/toileting    Select interventions that have been implemented:   Patient closer to station    Select interventions that have been implemented:   Redirect patient    Select interventions that have been implemented:   Safety Rounder    Staff nurse/charge nurse to reassess safety requirement every 12 hours:   2 = Medium (increased aggression, frequent reminders, redirectable)    Has mobile tele-monitoring been trialed:   Not yet trialed    Type of observation:   Tele-monitoring   Do not attempt resuscitation (DNR)- Limited -Do Not Intubate (DNI)    Standing Status:   Standing    Number of Occurrences:   1    If pulseless and not breathing:   No CPR or chest compressions.    In Pre-Arrest Conditions (Patient Is Breathing and Has A Pulse):   Do not intubate. Provide all appropriate non-invasive medical interventions. Avoid ICU transfer unless indicated or required.    Consent::   Discussion documented in EHR or advanced directives reviewed   Consult to hospitalist    Standing Status:   Standing    Number of Occurrences:   1    Place call to::   [facility]    Reason for Consult:   Admit   Consult to psychiatry Location: [facility]; Reason for Consult? Requested per patient's brother. She has been depressed and anxious over recent weeks, mentioning that she doesn't want to live anymore. Admitted after accidental ...    Standing Status:   Standing    Number of Occurrences:   1    Location:   [facility] [[id number]]    Reason for Consult?:   Requested  per patient's brother. She has been depressed and anxious over recent weeks, mentioning that she doesn't  want to live anymore. Admitted after accidental overdose.   Consult to palliative care    Standing Status:   Standing    Number of Occurrences:   1    Palliative Care Consult Services:   Palliative Medicine Consult    Reason for Consult?:   GOC.  Diagnosed lung mass with possible metastases in the liver.  Possible renal cell cancer as well.  Patient not sure about treatment going forward.  Would be helpful to have goals of care conversation with her.   Consult to Transition of Care Team    Standing Status:   Standing    Number of Occurrences:   1    Reason for Consult::   Home Health / DME Needs    Reason for Consult::   SNF placement   May transport without cardiac monitor    Standing Status:   Standing    Number of Occurrences:   1   OT eval and treat    Standing Status:   Standing    Number of Occurrences:   1    Reason for OT?:   debility   PT eval and treat    Standing Status:   Standing    Number of Occurrences:   1    Reason for PT?:   debility   Pulse oximetry check with vital signs    Standing Status:   Standing    Number of Occurrences:   1   Oxygen therapy Mode or (Route): Nasal cannula; Liters Per Minute: 2; Keep O2 saturation between: greater than 92 %    Standing Status:   Standing    Number of Occurrences:   1    Mode or (Route):   Nasal cannula    Liters Per Minute:   2    Keep O2 saturation between:   greater than 92 %   Incentive spirometry    Standing Status:   Standing    Number of Occurrences:   1   SLP eval and treat Reason for evaluation: .Swallowing evaluation (BSE, MBS and/or diet order as indicated)    Standing Status:   Standing    Number of Occurrences:   1    Reason for evaluation:   .Swallowing evaluation (BSE, MBS and/or diet order as indicated)   CBG monitoring, ED    Standing Status:   Standing    Number of Occurrences:   1   EKG 12-Lead    Standing Status:   Standing    Number of Occurrences:   1   EKG 12-Lead    Standing Status:    Standing    Number of Occurrences:   1    Notes:   check qtc   EKG 12-Lead    Standing Status:   Standing    Number of Occurrences:   1   ECHOCARDIOGRAM COMPLETE    Standing Status:   Standing    Number of Occurrences:   1    Perflutren DEFINITY (image enhancing agent) should be administered unless hypersensitivity or allergy exist:   Administer Perflutren    Reason for exam-Echo:   Abnormal ECG  R94.31    Release to patient:   Immediate   Place in observation (patient's expected length of stay will be less than 2 midnights)    Standing Status:   Standing    Number of Occurrences:   1  Hospital Area:   [facility] [[id number]]    Level of Care:   Progressive [102]    Admit to Progressive based on following criteria:   CARDIOVASCULAR & THORACIC of moderate stability with acute coronary syndrome symptoms/low risk myocardial infarction/hypertensive urgency/arrhythmias/heart failure potentially compromising stability and stable post cardiovascular intervention patients.    Admit to Progressive based on following criteria:   MULTISYSTEM THREATS such as stable sepsis, metabolic/electrolyte imbalance with or without encephalopathy that is responding to early treatment.    Admit to Progressive based on following criteria:   ACUTE MENTAL DISORDER-RELATED Drug/Alcohol Ingestion/Overdose/Withdrawal, Suicidal Ideation/attempt requiring safety sitter and < Q2h monitoring/assessments, moderate to severe agitation that is managed with medication/sitter, CIWA-Ar score < 20.    May place patient in observation at [facility] or [facility] if equivalent level of care is available::   No    Covid Evaluation:   Asymptomatic - no recent exposure (last 10 days) testing not required    Diagnosis:   Benzodiazepine overdose, accidental or unintentional, initial encounter [[id number]]    Admitting Physician:   Therisa Doyne [[id number]]    Attending Physician:   Therisa Doyne [[id number]]   Admit to  Inpatient (patient's expected length of stay will be greater than 2 midnights or inpatient only procedure)    Standing Status:   Standing    Number of Occurrences:   1    Hospital Area:   [facility] [[id number]]    Level of Care:   Med-Surg [16]    Covid Evaluation:   Asymptomatic - no recent exposure (last 10 days) testing not required    Diagnosis:   Lung mass [[id number]]    Admitting Physician:   Osvaldo Shipper [[id number]]    Attending Physician:   Osvaldo Shipper [[id number]]    Certification::   I certify this patient will need inpatient services for at least 2 midnights   Change Level of Care    Standing Status:   Standing    Number of Occurrences:   1    Hospital Area:   [facility] [[id number]]    Level of Care:   Med-Surg [16]     MEDICAL HISTORY:  Past Medical History:  Diagnosis Date   Best dystrophy 2012   Chest pain, atypical    Heart murmur    HYPERLIPIDEMIA    HYPOTHYROIDISM, POST-RADIOACTIVE IODINE    OSTEOPENIA    Sleep apnea    TOBACCO USE    UNSPECIFIED SUBJECTIVE VISUAL DISTURBANCE    VITAMIN D DEFICIENCY    WART, RIGHT HAND     SURGICAL HISTORY: Past Surgical History:  Procedure Laterality Date   ABDOMINAL HYSTERECTOMY     CARPAL TUNNEL RELEASE     EXTRACORPOREAL SHOCK WAVE LITHOTRIPSY Right 10/15/2020   Procedure: EXTRACORPOREAL SHOCK WAVE LITHOTRIPSY (ESWL);  Surgeon: Alfredo Martinez, MD;  Location: [facility];  Service: Urology;  Laterality: Right;   EXTRACORPOREAL SHOCK WAVE LITHOTRIPSY Right 11/19/2020   Procedure: EXTRACORPOREAL SHOCK WAVE LITHOTRIPSY (ESWL);  Surgeon: Jerilee Field, MD;  Location: [facility];  Service: Urology;  Laterality: Right;   EYE SURGERY     LAPAROSCOPIC OOPHERECTOMY     LITHOTRIPSY     TONSILLECTOMY      SOCIAL HISTORY: Social History   Socioeconomic History   Marital status: Divorced    Spouse name: Not on file   Number of children: Not on file   Years  of education: 14   Highest education  level: Not on file  Occupational History   Not on file  Tobacco Use   Smoking status: Former    Current packs/day: 0.00    Average packs/day: 1 pack/day for 50.0 years (50.0 ttl pk-yrs)    Types: Cigarettes    Start date: 02/04/1961    Quit date: 02/05/2011    Years since quitting: 12.4    Passive exposure: Past   Smokeless tobacco: Never  Substance and Sexual Activity   Alcohol use: Yes    Alcohol/week: 0.0 standard drinks of alcohol    Comment: rarely   Drug use: No   Sexual activity: Not Currently  Other Topics Concern   Not on file  Social History Narrative   Drinks 3-5 cups of caffeine daily.   Social Drivers of Corporate investment banker Strain: Low Risk  (10/25/2021)   Overall Financial Resource Strain (CARDIA)    Difficulty of Paying Living Expenses: Not very hard  Food Insecurity: No Food Insecurity (06/23/2023)   Hunger Vital Sign    Worried About Running Out of Food in the Last Year: Never true    Ran Out of Food in the Last Year: Never true  Transportation Needs: Unmet Transportation Needs (06/23/2023)   PRAPARE - Administrator, Civil Service (Medical): Yes    Lack of Transportation (Non-Medical): Yes  Physical Activity: Insufficiently Active (10/25/2021)   Exercise Vital Sign    Days of Exercise per Week: 4 days    Minutes of Exercise per Session: 30 min  Stress: Stress Concern Present (10/25/2021)   [employer] of Occupational Health - Occupational Stress Questionnaire    Feeling of Stress : To some extent  Social Connections: Moderately Isolated (06/23/2023)   Social Connection and Isolation Panel [NHANES]    Frequency of Communication with Friends and Family: More than three times a week    Frequency of Social Gatherings with Friends and Family: More than three times a week    Attends Religious Services: 1 to 4 times per year    Active Member of [employer] or Organizations: No    Attends Banker  Meetings: Never    Marital Status: Divorced  Catering manager Violence: Not At Risk (06/23/2023)   Humiliation, Afraid, Rape, and Kick questionnaire    Fear of Current or Ex-Partner: No    Emotionally Abused: No    Physically Abused: No    Sexually Abused: No    FAMILY HISTORY: Family History  Problem Relation Age of Onset   Cancer Brother        thyroid   Prostate cancer Brother    Skin cancer Sister    Colon cancer Paternal Grandmother    Thyroid cancer Brother     REVIEW OF SYSTEMS:   Constitutional: +Generalized pain, denies fevers, chills or abnormal night sweats Eyes: Denies blurriness of vision, double vision or watery eyes Ears, nose, mouth, throat, and face: Denies mucositis or sore throat Respiratory: Denies cough, dyspnea or wheezes Cardiovascular: Denies palpitation, chest discomfort or lower extremity swelling Gastrointestinal: Denies nausea, heartburn or change in bowel habits Skin: Denies abnormal skin rashes Lymphatics: Denies new lymphadenopathy or easy bruising Neurological: Denies numbness, tingling or new weaknesses Behavioral/Psych: Mood is stable, no new changes  All other systems were reviewed with the patient and are negative.  PHYSICAL EXAMINATION: ECOG PERFORMANCE STATUS: 3 - Symptomatic, >50% confined to bed  Vitals:   06/29/23 0830 06/29/23 0848  BP: 134/82 139/74  Pulse: (!) 59   Resp:  18   Temp: (!) 97.5 F (36.4 C)   SpO2: 97%    Filed Weights   06/22/23 1752 06/23/23 1827  Weight: 216 lb 14.9 oz (98.4 kg) 204 lb 14.4 oz (92.9 kg)    GENERAL: alert, no distress and comfortable +fatigue SKIN: Sun pale skin color, texture, turgor are normal, no rashes or significant lesions EYES: normal, conjunctiva are pink and non-injected, sclera clear OROPHARYNX: no exudate, no erythema and lips, buccal mucosa, and tongue normal  NECK: supple, thyroid normal size, non-tender, without nodularity LYMPH: no palpable lymphadenopathy in the cervical,  axillary or inguinal LUNGS: clear to auscultation and percussion with normal breathing effort HEART: regular rate & rhythm and no murmurs and no lower extremity edema ABDOMEN: abdomen soft, non-tender and normal bowel sounds MUSCULOSKELETAL: no cyanosis of digits and no clubbing  PSYCH: alert & oriented x 3 with fluent speech NEURO: no focal motor/sensory deficits   ALLERGIES:  has no known allergies.  MEDICATIONS:  Current Facility-Administered Medications  Medication Dose Route Frequency Provider Last Rate Last Admin   acetaminophen (TYLENOL) tablet 650 mg  650 mg Oral Q6H PRN Therisa Doyne, MD   650 mg at 06/29/23 0501   Or   acetaminophen (TYLENOL) suppository 650 mg  650 mg Rectal Q6H PRN Doutova, Anastassia, MD       albuterol (PROVENTIL) (2.5 MG/3ML) 0.083% nebulizer solution 2.5 mg  2.5 mg Nebulization Q2H PRN Doutova, Anastassia, MD   2.5 mg at 06/28/23 0132   amLODipine (NORVASC) tablet 5 mg  5 mg Oral Daily Osvaldo Shipper, MD   5 mg at 06/29/23 0848   escitalopram (LEXAPRO) tablet 5 mg  5 mg Oral Daily Cinderella, Margaret A   5 mg at 06/29/23 0848   guaiFENesin (MUCINEX) 12 hr tablet 600 mg  600 mg Oral BID Therisa Doyne, MD   600 mg at 06/29/23 0847   lamoTRIgine (LAMICTAL) tablet 150 mg  150 mg Oral Daily Osvaldo Shipper, MD   150 mg at 06/29/23 0847   levothyroxine (SYNTHROID) tablet 150 mcg  150 mcg Oral QAC breakfast Therisa Doyne, MD   150 mcg at 06/29/23 0502   lidocaine (LIDODERM) 5 % 2 patch  2 patch Transdermal Q24H Anthoney Harada, NP   2 patch at 06/28/23 2154   nicotine (NICODERM CQ - dosed in mg/24 hours) patch 14 mg  14 mg Transdermal Daily Osvaldo Shipper, MD   14 mg at 06/29/23 0850   polyethylene glycol (MIRALAX / GLYCOLAX) packet 17 g  17 g Oral Daily PRN Osvaldo Shipper, MD   17 g at 06/27/23 2124   pravastatin (PRAVACHOL) tablet 40 mg  40 mg Oral q1800 Therisa Doyne, MD   40 mg at 06/28/23 1822   predniSONE (DELTASONE) tablet 20 mg   20 mg Oral Q breakfast Osvaldo Shipper, MD   20 mg at 06/29/23 0848   senna-docusate (Senokot-S) tablet 2 tablet  2 tablet Oral BID Osvaldo Shipper, MD   2 tablet at 06/29/23 0847     LABORATORY DATA:  I have reviewed the data as listed Lab Results  Component Value Date   WBC 7.1 06/29/2023   HGB 11.8 (L) 06/29/2023   HCT 37.5 06/29/2023   MCV 94.9 06/29/2023   PLT 154 06/29/2023   Recent Labs    06/23/23 0443 06/24/23 0430 06/27/23 0427 06/28/23 0424 06/29/23 0357  NA 141   < > 135 136 136  K 3.8   < > 4.1 3.9 3.8  CL 108   < >  102 104 104  CO2 24   < > 23 24 23   GLUCOSE 106*   < > 86 91 83  BUN 13   < > 32* 30* 25*  CREATININE 0.94   < > 1.29* 1.19* 0.91  CALCIUM 8.7*   < > 8.9 8.5* 8.5*  GFRNONAA >60   < > 43* 47* >60  PROT 6.8  --  6.9  --  6.5  ALBUMIN 3.4*  --  3.5  --  3.1*  AST 12*  --  16  --  13*  ALT 8  --  12  --  12  ALKPHOS 61  --  52  --  46  BILITOT 0.4  --  0.5  --  0.7   < > = values in this interval not displayed.    RADIOGRAPHIC STUDIES: I have personally reviewed the radiological images as listed and agreed with the findings in the report. US BIOPSY (LIVER) Result Date: 06/27/2023 INDICATION: [id number] Liver lesion [id number] EXAM: ULTRASOUND GUIDED LIVER MASS BIOPSY COMPARISON:  Ultrasound RIGHT upper quadrant, earlier same day. CT AP, 06/23/2023. MR abdomen, 06/25/2023. MEDICATIONS: None ANESTHESIA/SEDATION: Moderate (conscious) sedation was employed during this procedure. A total of Versed 1 mg and Fentanyl 50 mcg was administered intravenously. Moderate Sedation Time: 10 minutes. The patient's level of consciousness and vital signs were monitored continuously by radiology nursing throughout the procedure under my direct supervision. COMPLICATIONS: None immediate. PROCEDURE: Informed written consent was obtained from insufficient wrap after a discussion of the risks, benefits and alternatives to treatment. The patient understands and consents the procedure.  A timeout was performed prior to the initiation of the procedure. Ultrasound scanning was performed of the right upper abdominal quadrant demonstrates small rounded LEFT hepatic lobe mass The LEFT hepatic lobe mass was selected for biopsy and the procedure was planned. The right upper abdominal quadrant was prepped and draped in the usual sterile fashion. The overlying soft tissues were anesthetized with 1% lidocaine with epinephrine. A 17 gauge, 6.8 cm co-axial needle was advanced into a peripheral aspect of the lesion. This was followed by 4 core biopsies with an 18 gauge core device under direct ultrasound guidance. Manual compression was applied for superficial hemostasis. Post procedural scanning was negative for definitive area of hemorrhage or additional complication. A dressing was placed. The patient tolerated the procedure well without immediate post procedural complication. IMPRESSION: Successful ultrasound guided core needle biopsy of the LEFT hepatic lobe mass. Roanna Banning, MD [facility] Electronically Signed   By: Roanna Banning M.D.   On: 06/27/2023 15:57   US Abdomen Limited RUQ (LIVER/GB) Result Date: 06/27/2023 CLINICAL DATA:  [id number] Liver lesion [id number] EXAM: ULTRASOUND ABDOMEN LIMITED RIGHT UPPER QUADRANT COMPARISON:  CT AP, 06/23/2023.  MR abdomen, 06/25/2023. FINDINGS: Suboptimal evaluation, with motion degradation and poor acoustic penetration secondary to patient habitus. Gallbladder: No gallstones or wall thickening visualized. No sonographic Murphy sign noted by sonographer. Common bile duct: Diameter: 0.4 cm Liver: Increased hepatic parenchymal echogenicity. LEFT hepatic lobe heterogeneously-echogenic mass with internal vascularity, measuring approximately 1.7 x 1.5 x 1.3 cm see key image. Portal vein is patent on color Doppler imaging with normal direction of blood flow towards the liver. Other: No perihepatic fluid. IMPRESSION:  1. Prominent 1.7 cm LEFT hepatic lobe mass. Findings correlate with lesion seen on comparison CT/MR, and suspicious for metastatic disease. 2. Echogenic liver. Findings most commonly seen in hepatic steatosis, though may also represent hepatitis and/or fibrosis. Cletis Athens  Mugweru, MD [facility] Radiology Electronically Signed   By: Roanna Banning M.D.   On: 06/27/2023 11:24   MR ABDOMEN W WO CONTRAST Result Date: 06/26/2023 CLINICAL DATA:  Lung cancer. Multiple liver lesions on staging abdomen/pelvis CT. EXAM: MRI ABDOMEN WITHOUT AND WITH CONTRAST TECHNIQUE: Multiplanar multisequence MR imaging of the abdomen was performed both before and after the administration of intravenous contrast. CONTRAST:  9mL GADAVIST GADOBUTROL 1 MMOL/ML IV SOLN COMPARISON:  Abdomen and pelvis CT 06/23/2023 FINDINGS: Lower chest: Small left lower lobe pulmonary nodule better characterized on recent CT imaging. Hepatobiliary: Multiple small liver lesions are seen in both hepatic lobes. Index lesion lateral segment left liver measures 14 mm on axial T2 image 17 of series 4. Index lesion in the medial right liver measures 8 mm on image 15/4. Lesions range in size from approximately 3-4 mm up to the dominant lateral segment left liver lesion at 14 mm. Lesions demonstrate restricted perfusion. Postcontrast imaging is markedly motion degraded which limits assessment. Dominant lesions in the left liver demonstrate subtle irregular rim enhancement (see left hepatic dome lesion on 33/17 and lateral segment left liver lesion on image 36/12). Gallbladder is nondistended with tiny gallstone evident. No gallbladder wall thickening or pericholecystic fluid. No intrahepatic or extrahepatic biliary dilation. Pancreas: No focal mass lesion. No dilatation of the main duct. No intraparenchymal cyst. No peripancreatic edema. Spleen:  No splenomegaly. No suspicious focal mass lesion. Adrenals/Urinary Tract: No  right adrenal nodule or mass. 15 mm left adrenal nodule shows loss of signal intensity on out of phase T1 imaging consistent with adrenal adenoma. Small cortical cysts are noted in both kidneys. 2.9 x 2.8 cm heterogeneously enhancing lesion is identified in the lower pole right kidney, not well evaluated on postcontrast imaging due to substantial motion degradation. Imaging features concerning for renal cell carcinoma. A second similar 13 mm lesion is identified in the interpolar right kidney (axial 72/17) which may have ill-defined peripheral enhancement, raising concern for neoplasm. 10 mm interpolar left renal lesion on 68/17 is indeterminate as enhancement cannot be excluded on the 3 minute delayed imaging. Stomach/Bowel: Tiny hiatal hernia. Stomach otherwise unremarkable. Duodenum does not cross the midline as expected consistent with intestinal malrotation. No small bowel or colonic dilatation within the visualized abdomen. Vascular/Lymphatic: No abdominal aortic aneurysm. No abdominal lymphadenopathy. Other:  No intraperitoneal free fluid. Musculoskeletal: No focal suspicious marrow enhancement within the visualized bony anatomy. IMPRESSION: 1. Multiple small liver lesions in both hepatic lobes, ranging in size from approximately 3-4 mm up to the dominant lateral segment left liver lesion at 14 mm. Although postcontrast imaging is markedly motion degraded, imaging features are concerning for metastatic disease. 2. 2.9 x 2.8 cm heterogeneously enhancing lesion in the lower pole right kidney, not well evaluated on postcontrast imaging due to substantial motion degradation. Imaging features concerning for renal cell carcinoma. A second similar 13 mm lesion is identified in the interpolar right kidney and a 10 mm interpolar left renal lesion are indeterminate as enhancement cannot be excluded on the 3 minute delayed imaging. Close follow-up recommended. 3. 15 mm left adrenal adenoma. 4. Cholelithiasis. 5. Tiny  hiatal hernia. 6. Intestinal malrotation. Electronically Signed   By: Kennith Center M.D.   On: 06/26/2023 05:22   MR BRAIN W WO CONTRAST Result Date: 06/23/2023 CLINICAL DATA:  Metastatic disease evaluation.  Lung cancer. EXAM: MRI HEAD WITHOUT AND WITH CONTRAST TECHNIQUE: Multiplanar, multiecho pulse sequences of the brain and surrounding structures were obtained  without and with intravenous contrast. CONTRAST:  10mL GADAVIST GADOBUTROL 1 MMOL/ML IV SOLN COMPARISON:  01/06/2009 FINDINGS: Brain: Diffusion imaging does not show any acute or subacute infarction or other cause of restricted diffusion. Brain shows atrophy with extensive chronic small-vessel ischemic changes affecting the pons and cerebral hemispheric white matter. No large vessel territory stroke. No evidence of mass lesion, hemorrhage, hydrocephalus or extra-axial collection. After contrast administration, no abnormal brain or leptomeningeal enhancement occurs. Vascular: Major vessels at the base of the brain show flow. Skull and upper cervical spine: Negative Sinuses/Orbits: Clear/normal Other: None IMPRESSION: No evidence of metastatic disease. Atrophy with extensive chronic small-vessel ischemic changes of the pons and cerebral hemispheric white matter. Electronically Signed   By: Paulina Fusi M.D.   On: 06/23/2023 14:33   CT ABDOMEN PELVIS W CONTRAST Result Date: 06/23/2023 CLINICAL DATA:  Staging lung cancer.  * Tracking Code: BO * EXAM: CT ABDOMEN AND PELVIS WITH CONTRAST TECHNIQUE: Multidetector CT imaging of the abdomen and pelvis was performed using the standard protocol following bolus administration of intravenous contrast. RADIATION DOSE REDUCTION: This exam was performed according to the departmental dose-optimization program which includes automated exposure control, adjustment of the mA and/or kV according to patient size and/or use of iterative reconstruction technique. CONTRAST:  OMNIPAQUE IOHEXOL 300 MG/ML  SOLN  COMPARISON:  Abdomen pelvis CT 10/05/2020. CT angiogram chest 06/22/2023 FINDINGS: Lower chest: Please correlate with CT of the chest from 06/22/2023. Again left lower lobe lung nodule seen on series 6, image 8. Pericardial effusion. Hepatobiliary: Multiple liver masses identified worrisome for metastatic disease. At least 7 lesions are identified. These small 1 of the larger foci seen anteriorly in segment 2 measuring 12 by 10 mm on series 2, image 12. Patent portal vein. Gallbladder is nondilated. Pancreas: Unremarkable. No pancreatic ductal dilatation or surrounding inflammatory changes. Spleen: T1 Adrenals/Urinary Tract: Right adrenal gland is slightly nodular. On series 2, image 25 this measures 13 by 11 mm. There is a left adrenal nodule as well measuring 16 by 10 mm. Indeterminate. There is contrast in the renal collecting systems related to the prior exam. Mild bilateral renal atrophy. There is a lower pole lesion on the right measuring on series 2, image 42 2.1 by 1.6 cm. This has Hounsfield units of 51 on portal venous phase in on delayed forty-three. This has with a lobular margins on coronal imaging and is suspicious. There are some other lesions in each kidney which are also indeterminate based on appearance today. Not clearly cysts. Ureters have normal course and caliber down to the bladder. Preserved contours of the urinary bladder. Stomach/Bowel: Large bowel has scattered stool and diverticula. There are redundant course of the colon diffusely. The cecum actually resides in the anterior left midabdomen. Normal appendix extends caudal to the cecum. Small bowel is nondilated with several small bowel loops extending to the right side lateral to the course of the ascending colon. Stomach is nondilated. Small hiatal hernia. Vascular/Lymphatic: Aortic atherosclerosis. No enlarged abdominal or pelvic lymph nodes. Reproductive: Prostate is unremarkable. Other: No free air or free fluid. Musculoskeletal:  Scattered degenerative changes of the spine and pelvis. Transitional lumbosacral segment. IMPRESSION: Multiple aggressive appearing liver lesions worrisome for metastatic disease. Renal lesions are also seen which are indeterminate. One lesion in particular along the lower pole of the right kidney has a aggressive features and could be primary lesion versus metastatic focus. Bilateral adrenal nodules are noted which are indeterminate. Overall with although these areas MRI may be  of some benefit with and without contrast dynamic to further delineate the etiology of the structures. No bowel obstruction. Colonic diverticula. Atypical course of the colon with the cecum in the anterior left midabdomen. Please correlate with previous CT angiogram of the chest. Electronically Signed   By: Karen Kays M.D.   On: 06/23/2023 11:43   ECHOCARDIOGRAM COMPLETE Result Date: 06/23/2023    ECHOCARDIOGRAM REPORT   Patient Name:   Yolanda Reeves Date of Exam: 06/23/2023 Medical Rec #:  [id number]       Height:       61.0 in Accession #:    [id number]      Weight:       216.9 lb Date of Birth:  03-Mar-1946       BSA:          1.955 m Patient Age:    77 years        BP:           149/71 mmHg Patient Gender: F               HR:           57 bpm. Exam Location:  Inpatient Procedure: 2D Echo, Cardiac Doppler and Color Doppler Indications:    R94.31 Abnormal EKG  History:        Patient has no prior history of Echocardiogram examinations.                 COPD and HLD; Signs/Symptoms:Chest Pain.  Sonographer:    Webb Laws Referring Phys: [id number] ANASTASSIA DOUTOVA IMPRESSIONS  1. Left ventricular ejection fraction, by estimation, is 60 to 65%. The left ventricle has normal function. The left ventricle has no regional wall motion abnormalities. Left ventricular diastolic parameters are consistent with Grade I diastolic dysfunction (impaired relaxation). Elevated left ventricular end-diastolic pressure.  2. Right ventricular systolic  function is normal. The right ventricular size is normal.  3. The mitral valve is abnormal. No evidence of mitral valve regurgitation. No evidence of mitral stenosis. Moderate mitral annular calcification.  4. The aortic valve is tricuspid. There is mild calcification of the aortic valve. There is mild thickening of the aortic valve. Aortic valve regurgitation is not visualized. Aortic valve sclerosis is present, with no evidence of aortic valve stenosis.  5. The inferior vena cava is dilated in size with >50% respiratory variability, suggesting right atrial pressure of 8 mmHg. FINDINGS  Left Ventricle: Left ventricular ejection fraction, by estimation, is 60 to 65%. The left ventricle has normal function. The left ventricle has no regional wall motion abnormalities. The left ventricular internal cavity size was normal in size. There is  no left ventricular hypertrophy. Left ventricular diastolic parameters are consistent with Grade I diastolic dysfunction (impaired relaxation). Elevated left ventricular end-diastolic pressure. Right Ventricle: The right ventricular size is normal. No increase in right ventricular wall thickness. Right ventricular systolic function is normal. Left Atrium: Left atrial size was normal in size. Right Atrium: Right atrial size was normal in size. Pericardium: There is no evidence of pericardial effusion. Mitral Valve: The mitral valve is abnormal. There is mild thickening of the mitral valve leaflet(s). There is mild calcification of the mitral valve leaflet(s). Moderate mitral annular calcification. No evidence of mitral valve regurgitation. No evidence  of mitral valve stenosis. Tricuspid Valve: The tricuspid valve is normal in structure. Tricuspid valve regurgitation is not demonstrated. No evidence of tricuspid stenosis. Aortic Valve: The aortic valve is tricuspid. There is mild calcification  of the aortic valve. There is mild thickening of the aortic valve. Aortic valve  regurgitation is not visualized. Aortic valve sclerosis is present, with no evidence of aortic valve stenosis. Pulmonic Valve: The pulmonic valve was normal in structure. Pulmonic valve regurgitation is not visualized. No evidence of pulmonic stenosis. Aorta: The aortic root is normal in size and structure. Venous: The inferior vena cava is dilated in size with greater than 50% respiratory variability, suggesting right atrial pressure of 8 mmHg. IAS/Shunts: No atrial level shunt detected by color flow Doppler.  LEFT VENTRICLE PLAX 2D LVIDd:         4.20 cm     Diastology LVIDs:         2.90 cm     LV e' medial:    3.26 cm/s LV PW:         1.10 cm     LV E/e' medial:  18.7 LV IVS:        0.90 cm     LV e' lateral:   4.35 cm/s LVOT diam:     1.90 cm     LV E/e' lateral: 14.0 LV SV:         55 LV SV Index:   28 LVOT Area:     2.84 cm  LV Volumes (MOD) LV vol d, MOD A2C: 35.3 ml LV vol d, MOD A4C: 34.1 ml LV vol s, MOD A2C: 8.3 ml LV vol s, MOD A4C: 10.4 ml LV SV MOD A2C:     27.0 ml LV SV MOD A4C:     34.1 ml LV SV MOD BP:      25.7 ml RIGHT VENTRICLE             IVC RV Basal diam:  3.90 cm     IVC diam: 2.30 cm RV S prime:     10.10 cm/s TAPSE (M-mode): 2.5 cm LEFT ATRIUM             Index        RIGHT ATRIUM           Index LA diam:        2.80 cm 1.43 cm/m   RA Area:     13.90 cm LA Vol (A2C):   18.1 ml 9.26 ml/m   RA Volume:   33.10 ml  16.93 ml/m LA Vol (A4C):   20.2 ml 10.33 ml/m LA Biplane Vol: 20.0 ml 10.23 ml/m  AORTIC VALVE LVOT Vmax:   90.90 cm/s LVOT Vmean:  58.600 cm/s LVOT VTI:    0.195 m  AORTA Ao Root diam: 3.10 cm Ao Asc diam:  3.40 cm MITRAL VALVE               TRICUSPID VALVE MV Area (PHT): 2.69 cm    TR Peak grad:   13.1 mmHg MV Decel Time: 282 msec    TR Vmax:        181.00 cm/s MV E velocity: 60.80 cm/s MV A velocity: 97.30 cm/s  SHUNTS MV E/A ratio:  0.62        Systemic VTI:  0.20 m                            Systemic Diam: 1.90 cm Charlton Haws MD Electronically signed by Charlton Haws MD  Signature Date/Time: 06/23/2023/9:51:44 AM    Final    CT Angio Chest Pulmonary Embolism (PE) W or WO Contrast Result Date: 06/22/2023  CLINICAL DATA:  Unresponsive vomiting EXAM: CT ANGIOGRAPHY CHEST WITH CONTRAST TECHNIQUE: Multidetector CT imaging of the chest was performed using the standard protocol during bolus administration of intravenous contrast. Multiplanar CT image reconstructions and MIPs were obtained to evaluate the vascular anatomy. RADIATION DOSE REDUCTION: This exam was performed according to the departmental dose-optimization program which includes automated exposure control, adjustment of the mA and/or kV according to patient size and/or use of iterative reconstruction technique. CONTRAST:  75mL OMNIPAQUE IOHEXOL 350 MG/ML SOLN COMPARISON:  06/22/2023 chest x-ray FINDINGS: Cardiovascular: Satisfactory opacification of the pulmonary arteries to the segmental level. No evidence of pulmonary embolism. Moderate aortic atherosclerosis. No aneurysm. Normal cardiac size. Small pericardial effusion. Coronary vascular calcification. Mediastinum/Nodes: Patent trachea. No suspicious thyroid mass. Enlarged AP window lymph nodes measuring up to 17 mm on series 4, image 39. Multiple enlarged left hilar nodes measuring up to 17 mm. Esophagus within normal limits. Small hiatal hernia Lungs/Pleura: No pleural effusion or pneumothorax. Large left apical lung mass measuring approximately 6.2 by 4.6 by 4.5 cm on series 4, image 22 and series 6, image 102. Additional spiculated right upper lobe solid mass measuring 2.4 by 1.7 by 1.6 cm on series 12, image 32 and coronal series 6, image 92. Additional left lower lobe pulmonary nodule measuring 6 mm on series 12, image 60 and 3 mm left upper lobe pulmonary nodule on series 12, image 43. Scattered hazy pulmonary densities could be due to atelectasis or small airways disease. Upper Abdomen: No acute finding Musculoskeletal: No acute or suspicious osseous abnormality.  Review of the MIP images confirms the above findings. IMPRESSION: 1. Negative for acute pulmonary embolus. 2. Large left apical lung mass measuring up to 6.2 cm, concerning for lung carcinoma. Additional spiculated solid mass in the right upper lobe measuring 2.4 cm which may represent metastatic lesion or synchronous lung CA. Additional small left-sided predominant pulmonary nodules measuring up to 6 mm in the left lower lobe are indeterminate for metastatic disease. 3. Enlarged left hilar and AP window lymph nodes, suspicious for metastatic disease. 4. Aortic atherosclerosis. Aortic Atherosclerosis (ICD10-I70.0). Electronically Signed   By: Jasmine Pang M.D.   On: 06/22/2023 23:48   DG Chest Port 1 View Result Date: 06/22/2023 CLINICAL DATA:  Unresponsive with vomiting. EXAM: PORTABLE CHEST 1 VIEW COMPARISON:  None Available. FINDINGS: The heart size and mediastinal contours are within normal limits. Low lung volumes are noted. Mild atelectatic changes are seen within the bilateral lung bases. A small patchy area of scarring, atelectasis and/or infiltrate is seen within the periphery of the upper right lung. This is increased in severity when compared to the prior study. Opacification of the medial aspect of the left apex is also noted. No pleural effusion or pneumothorax is identified. Multilevel degenerative changes are seen throughout the thoracic spine. IMPRESSION: 1. Low lung volumes with mild bibasilar atelectasis. 2. Small patchy area of scarring, atelectasis and/or infiltrate within the periphery of the upper right lung. 3. Opacification of the medial left apex which may represent an additional area of airspace disease. CT correlation is recommended to further exclude the presence of an underlying pulmonary or mediastinal mass. Electronically Signed   By: Aram Candela M.D.   On: 06/22/2023 20:08     The total time spent in the appointment was 55 minutes encounter with patients including review  of chart and various tests results, discussions about plan of care and coordination of care plan   All questions were answered. The patient knows to  call the clinic with any problems, questions or concerns. No barriers to learning was detected.  Dawson Bills, NP 1/23/20259:39 AM   ADDENDUM: Hematology/oncology Attending:  I had a face-to-face encounter with the patient today.  I reviewed her records, lab, scan and recommended her care plan.  I agree with the above note.  This is a very pleasant 78 years old white female who was recently diagnosed with metastatic non-small cell lung cancer, squamous cell carcinoma presented with large left upper lobe lung mass in addition to left hilar and mediastinal lymphadenopathy as well as right upper lobe lung mass and metastatic disease to the liver.  Her presentation started when the patient presented to the emergency department with unintentional Valium overdose and she was unresponsive with nausea and vomiting prior to the arrival to the emergency department.  During her evaluation she had CT angiogram of the chest that showed the large left apical lung mass with the suspicious left hilar and mediastinal lymphadenopathy.  During her evaluation she had CT of the abdomen pelvis followed by MRI that showed evidence of multiple liver metastasis.  MRI of the brain was negative for metastatic disease to the brain. The patient underwent ultrasound-guided core biopsy of one of the metastatic liver lesion and the final pathology was consistent with metastatic squamous cell carcinoma likely of lung primary. I had a lengthy discussion with the patient today about her current condition and treatment options.  I explained to the patient that she has stage IV non-small cell lung cancer which is incurable condition and all the treatment will be of palliative nature.  I gave the patient the option of palliative care and hospice with expected survival of less than 3 months  versus consideration of palliative chemoimmunotherapy with expected survival in the range of 18 months. I also explained to the patient that chemotherapy carries significant adverse effects including alopecia, myelosuppression, nausea and vomiting, peripheral neuropathy as well as liver or renal dysfunction and some of these complication can also lead to death. The patient mentions that she is sad about her prognosis but she also deaf and blind and she may not be interested in any aggressive therapy. I recommended for the patient to discuss her treatment options with her daughter and sister and if she is interested in systemic therapy will be happy to arrange for her a follow-up visit at the [facility] for additional evaluation and more detailed discussion of the treatment options but if she decided to consider palliative care and hospice then she will be referred by the primary team to this service. Thank you for allow me to participate in the care of Yolanda Reeves.  Please call if you have any questions. Disclaimer: This note was dictated with voice recognition software. Similar sounding words can inadvertently be transcribed and may be missed upon review. Lajuana Matte, MD

## 2023-06-29 NOTE — Consult Note (Addendum)
Yolanda Reeves  Patient Name: .Yolanda Reeves  MRN: [id number]  DOB: 29-Nov-1945  Consult Order details: Requested per pt's brother/depressed and anxious/accidental overdose/[place]  Mode of Visit: In person    Psychiatry Consult Evaluation  Service Date: June 29, 2023 LOS:  LOS: 4 days  Chief Complaint "I feel depressed because I'm hard of hearing, going blind, and unable to drive or go to the grocery store. I don't want to kill myself but I don't care if I die."   Primary Psychiatric Diagnoses  Major depressive disorder 2.  Generalized anxiety disorder 3.  Depression due to another medical condition (hypothyroidism).   Assessment  Yolanda Reeves is a 78 y.o. female admitted: Medicallyfor 06/22/2023  5:46 PM for . She carries the psychiatric diagnoses of Major depression and Anxiety and has a past medical history of chronic pain, hyperlipidemia, hypothyroidism, tobacco abuse, and osteopenia.   Her current presentation of depressed mood, lack of motivation, low energy level, hopelessness, helplessness, suicidal ideation and recent overdose is most consistent with Major depressive disorder. A diagnoses of Depression due to another medical condition can also be entertained due to the history of poorly treated  hypothyroidism (vs noncompliance w synthroid).  In a vacuum, She meets criteria for inpatient admission based on being severely depressed, ongoing suicidal ideation, being elderly, lives alone - however this admission has revealed a new cancer diagnosis and it is unclear if she will be returning to living alone. She has been persistently confused/delirious since admission. She does not remember the OD leading to admission; when she did remember it she denied on multiple occasions it was a suicide attempt.  Current outpatient psychotropic medications include Zoloft 150 mg daily, Lamotrigine 150 mg daily and Valium 5 mg daily for anxiety. Historically she has had  poor response to these medications. Adding medications has been challenging in the setting of qtc prolongation and bradycardia.  On initial examination, patient was calm and cooperative, she is alert and oriented to time, place, person and situation. She is now less oriented with poor attention. She no longer remembers the overdose that led to her admission. Patient's statements that she doesn't care if she lives or dies are congruent with long-standing values (father died of lung cancer despite aggressive treatment). Please see plan below for detailed recommendations.   Today 1/22 I am formally rescinding recommendation for inpatient psychiatry.   1/23: Patient seen and assessed with family (niece and sister ) present. Patient continues to present with some mild confusion, but very starky tone at times and engages well. She does present with some cognitive impairment, however suspect this maybe residual delirium as family reports she was not like this before. Patient seems to be very forgetful, difficulty retaining information, and understanding concepts. She Is overheard telling someone on the phone she is going into Hospice and that her hospice nurse was here. She was reminded that the oncology team has not been to discuss treatment. She continues to be adamant that the consumption of multiple valium was not intentional, and she does not want to take her life.  She identifies multiple protective factors and reasons as to why she would not hurt herself. She continues to deny suicidality, suicidal ideations, and self harm at this time. She does not display any acute psychosis, delusions, or paranoia at this time.    Diagnoses:  Active Hospital problems: Principal Problem:   Aspiration pneumonia (HCC) Active Problems:   HYPOTHYROIDISM, POST-RADIOACTIVE IODINE   Hyperlipidemia  Benzodiazepine overdose, accidental or unintentional, initial encounter   Prolonged QT interval   Lung mass   Severe episode  of recurrent major depressive disorder, without psychotic features (HCC)   Class 3 obesity   Normocytic anemia   Essential hypertension    Plan   ## Psychiatric Medication Recommendations:  -Please wean patient off Valium due to risk of sedation, recurrent pneumonia, risk of fall in elderly, cognitive impairment, respiratory suppression, and worsening of depression as a CNS depressant.  -Continue Lamotrigine 150 mg daily - may dc if qtc still prolonged -- C lexapro 5 mg daily (zoloft ineffective) -Patient does not meet criteria for inpatient psychiatric hospitalization -Consider TOC/Social worker consult to facilitate discharge planning. -Synthroid increased by primary; this should help to treat depression.   ## Medical Decision Making Capacity: Not specifically addressed in this encounter  -- most recent EKG on 1/19 had qtc of 519 in setting of sinus brady - repeat 1/22 nl now that out of bradycardia Labs earlier this admission includes: HB-11.2, TSH-66.58, T4-0.34  ## Disposition:-- Pt persistently delirious, with a new life-limiting cancer diagnosis and decreased functional mobility. Do not feel she would benefit from inpt psychiatry at this time.   -Family is setting up for rehab at maximum of 90 days, and then plan to transition to ALF home as patient can no longer take care of self.   ## Behavioral / Environmental: - No specific recommendations at this time.   -May continue safety sitter for fall risk.    ## Safety and Observation Level:  - Based on my clinical evaluation, I estimate the patient to be at low risk of self harm in the current setting. - At this time, we recommend routine obs . This decision is based on my review of the chart including patient's history and current presentation, interview of the patient, mental status examination, and consideration of suicide risk including evaluating suicidal ideation, plan, intent, suicidal or self-harm behaviors, risk factors,  and protective factors. This judgment is based on our ability to directly address suicide risk, implement suicide prevention strategies, and develop a safety plan while the patient is in the clinical setting. Please contact our team if there is a concern that risk level has changed.  CSSR Risk Category:C-SSRS RISK CATEGORY: No Risk  Suicide Risk Assessment: Patient has following modifiable risk factors for suicide: active suicidal ideation, under treated depression , and social isolation, which we are addressing by synthroid increase, potential referral to SNF.  Patient has following non-modifiable or demographic risk factors for suicide: separation or divorce Patient has the following protective factors against suicide: Supportive family and Pets in the home  Thank you for this consult request. Recommendations have been communicated to the primary team.  We will sign off at this time.   Maryagnes Amos, FNP       History of Present Illness  Relevant Aspects of [facility] Course:  Admitted on 06/22/2023 after she was found unconscious after a non-suicidal OD  on Valium. Patient is 78 year old female with medical history significant of chronic pain, hyperlipidemia, hypothyroidism, tobacco abuse, osteopenia, anxiety, depression, kidney stone who was brought in by EMS after she was found to be unresponsive by family member. She was apparently vomiting as well. She had taken 8 to 10 tablets of Valium 5 mg pills because she could not sleep. She denied suicidal intent. Workup in the emergency department raised concern for aspiration pneumonia and also revealed masses in the lung.She is being  worked up for now.   Patient Report:   The patient was seen in the late afternoon and was partially oriented, with difficulty maintaining attention, as evidenced by her inability to count backward by ones without skipping multiple numbers. She described today as a particularly difficult day, even making  a raspberry blowing sound. She remains unable to recall how she was admitted to the hospital but is aware that she is being treated for cancer and pneumonia. The patient expresses doubt that her depression medications have been effective, and she questions whether she had been consistently taking them, despite being several days into her Lamictal regimen. While she denies any suicidal thoughts, she reports significant depressive symptoms. She also denies any auditory or visual hallucinations, as well as homicidal ideation.  The patient clearly states, "I love myself, my daughter, my niece, my sister, and my brothers. I would never do that," firmly asserting that she would not take her own life. Although still confused, her family reports gradual improvement. She denies any side effects from the Lexapro. A detailed conversation was held with the patient and her family regarding future placement. The patient's niece plans to clean her home, with personal protective equipment provided, as the environment appears cluttered and unsanitary, with animals present. The patient acknowledges that her depression has hindered her ability to maintain her home due to both physical and self-care limitations. While the patient minimizes the severity of her depression, her niece believes she is suffering from severe emotional distress.  Given the current situation, no additional interventions can be offered at this time beyond continuing the current regimen of Lexapro and as-needed medication for depression management. The patient's depression may be affecting her cognitive processing, and it may be helpful for family members to be present during discussions moving forward to ensure clarity and support.   Collateral  Collateral information was obtained from the patient's niece, who expressed concern about the patient's future and what it might look like moving forward. The niece plans to clean the patient's home, and  personal protective equipment has been provided due to the unsanitary and cluttered living conditions, with animals present. The patient acknowledges that her depression has contributed to her inability to maintain her home, citing both physical limitations and difficulties with self-care. While the patient minimizes the severity of her depression, the niece feels that the patient is experiencing significant emotional distress. The family is focused on improving living conditions and providing support to the patient as they move forward.  Psych ROS:  Depression: depressed mood, lack of motivation, low energy level, fatigue, hopelessness, helplessness, poor sleep, suicide ideations.  Anxiety: excessive anxiety, irritability, apprehensive, and nervousness.  Mania (lifetime and current): denies  Psychosis: (lifetime and current): occasional visual hallucinations but denies auditory hallucinations and delusions.   Collateral information:  Eunice Blase -sister and niece were both present. Niece reports Gene is hard to reach as he has a chaotic schedule.   Review of Systems  Psychiatric/Behavioral:  Positive for depression. The patient is nervous/anxious.   All other systems reviewed and are negative.    Psychiatric and Social History  Psychiatric History:  Information collected from the patient   Prev Dx/Sx: Major depression and Anxiety Current Psych Provider: None Home Meds (current): Zoloft 150 mg daily, Lamotrigine 150 mg daily and Valium 5 mg daily for anxiety.  Previous Med Trials: None  Therapy: denies   Prior Psych Hospitalization: denies   Prior Self Harm: denies Prior Violence: denies   Family Psych History: denies  Family Hx suicide: denies   Social History:  Educational Hx: high school graduate Occupational Hx: unemployed, retired.  Legal Hx: denies  Living Situation: lives alone in her apartment  Spiritual Hx: unknown  Access to weapons/lethal means: denies    Substance  History Alcohol: denies   Type of alcohol former smoker, but currently vape Nicotine.  Last Drink unknown  Number of drinks per day N/A History of alcohol withdrawal seizures denies  History of DT's denies  Tobacco: former smoker  Illicit drugs: denies  Prescription drug abuse: denies  Rehab hx: denies   Exam Findings  Physical Exam: General appearance: Awake alert.  In no distress Resp: Coarse breath sounds with few crackles at the bases.  Occasional scattered wheezing.  No rhonchi. Cardio: S1-S2 is normal regular.  No S3-S4.  No rubs murmurs or bruit GI: Abdomen is soft.  Nontender nondistended.  Bowel sounds are present normal.  No masses organomegaly Extremities: No edema.  Some physical deconditioning is noted Neurologic: Alert and oriented x3.  No focal neurological deficits.   Vital Signs:  Temp:  [97.4 F (36.3 C)-97.9 F (36.6 C)] 97.9 F (36.6 C) (01/23 1458) Pulse Rate:  [59-74] 72 (01/23 1458) Resp:  [16-18] 16 (01/23 1458) BP: (112-139)/(68-98) 126/68 (01/23 1458) SpO2:  [93 %-97 %] 93 % (01/23 1458) Blood pressure 126/68, pulse 72, temperature 97.9 F (36.6 C), temperature source Oral, resp. rate 16, height 5\' 2"  (1.575 m), weight 92.9 kg, SpO2 93%. Body mass index is 37.48 kg/m.  Physical Exam Vitals and nursing note reviewed.  Constitutional:      Appearance: Normal appearance. She is obese.  HENT:     Head: Normocephalic.     Nose: Nose normal.  Pulmonary:     Effort: Pulmonary effort is normal.  Musculoskeletal:     Cervical back: Normal range of motion.  Neurological:     General: No focal deficit present.     Mental Status: She is alert and oriented to person, place, and time. Mental status is at baseline.  Psychiatric:        Mood and Affect: Mood normal.        Behavior: Behavior normal.        Thought Content: Thought content normal.        Judgment: Judgment normal.     Mental Status Exam: General Appearance: Casual and Well Groomed   Orientation:  Other:  Partial  Memory:  Recent;   Poor  Concentration:  Concentration: Fair  Recall:  Fair  Attention  Poor  Eye Contact:  Fair  Speech:  Normal Rate  Language:  Good  Volume:  Normal  Mood: I'm ok  Affect:  Congruent  Thought Process:  Linear  Thought Content:  Logical  Suicidal Thoughts:  No  Homicidal Thoughts:  No  Judgement:  Poor  Insight:  Lacking  Psychomotor Activity:  Decreased  Akathisia:  No  Fund of Knowledge:  Good      Assets:  Communication Skills Social Support  Cognition:  WNL  ADL's:  Impaired  AIMS (if indicated):        Other History   These have been pulled in through the EMR, reviewed, and updated if appropriate.  Family History:  The patient's family history includes Cancer in her brother; Colon cancer in her paternal grandmother; Prostate cancer in her brother; Skin cancer in her sister; Thyroid cancer in her brother.  Medical History: Past Medical History:  Diagnosis Date   Best dystrophy 2012  Chest pain, atypical    Heart murmur    HYPERLIPIDEMIA    HYPOTHYROIDISM, POST-RADIOACTIVE IODINE    OSTEOPENIA    Sleep apnea    TOBACCO USE    UNSPECIFIED SUBJECTIVE VISUAL DISTURBANCE    VITAMIN D DEFICIENCY    WART, RIGHT HAND     Surgical History: Past Surgical History:  Procedure Laterality Date   ABDOMINAL HYSTERECTOMY     CARPAL TUNNEL RELEASE     EXTRACORPOREAL SHOCK WAVE LITHOTRIPSY Right 10/15/2020   Procedure: EXTRACORPOREAL SHOCK WAVE LITHOTRIPSY (ESWL);  Surgeon: Alfredo Martinez, MD;  Location: [facility];  Service: Urology;  Laterality: Right;   EXTRACORPOREAL SHOCK WAVE LITHOTRIPSY Right 11/19/2020   Procedure: EXTRACORPOREAL SHOCK WAVE LITHOTRIPSY (ESWL);  Surgeon: Jerilee Field, MD;  Location: [facility]  Service: Urology;  Laterality: Right;   EYE SURGERY     LAPAROSCOPIC OOPHERECTOMY     LITHOTRIPSY     TONSILLECTOMY       Medications:   Current  Facility-Administered Medications:    acetaminophen (TYLENOL) tablet 650 mg, 650 mg, Oral, Q6H PRN, 650 mg at 06/29/23 1259 **OR** acetaminophen (TYLENOL) suppository 650 mg, 650 mg, Rectal, Q6H PRN, Doutova, Anastassia, MD   acetaminophen (TYLENOL) tablet 650 mg, 650 mg, Oral, TID, Mahan, Kasie J, NP   albuterol (PROVENTIL) (2.5 MG/3ML) 0.083% nebulizer solution 2.5 mg, 2.5 mg, Nebulization, Q2H PRN, Doutova, Anastassia, MD, 2.5 mg at 06/28/23 0132   amLODipine (NORVASC) tablet 5 mg, 5 mg, Oral, Daily, Osvaldo Shipper, MD, 5 mg at 06/29/23 0848   escitalopram (LEXAPRO) tablet 5 mg, 5 mg, Oral, Daily, Cinderella, Margaret A, 5 mg at 06/29/23 0848   guaiFENesin (MUCINEX) 12 hr tablet 600 mg, 600 mg, Oral, BID, Doutova, Anastassia, MD, 600 mg at 06/29/23 0847   lamoTRIgine (LAMICTAL) tablet 150 mg, 150 mg, Oral, Daily, Osvaldo Shipper, MD, 150 mg at 06/29/23 0847   levothyroxine (SYNTHROID) tablet 150 mcg, 150 mcg, Oral, QAC breakfast, Doutova, Anastassia, MD, 150 mcg at 06/29/23 0502   nicotine (NICODERM CQ - dosed in mg/24 hours) patch 14 mg, 14 mg, Transdermal, Daily, Osvaldo Shipper, MD, 14 mg at 06/29/23 0850   polyethylene glycol (MIRALAX / GLYCOLAX) packet 17 g, 17 g, Oral, Daily PRN, Osvaldo Shipper, MD, 17 g at 06/27/23 2124   pravastatin (PRAVACHOL) tablet 40 mg, 40 mg, Oral, q1800, Doutova, Anastassia, MD, 40 mg at 06/28/23 1822   predniSONE (DELTASONE) tablet 20 mg, 20 mg, Oral, Q breakfast, Osvaldo Shipper, MD, 20 mg at 06/29/23 0848   senna-docusate (Senokot-S) tablet 2 tablet, 2 tablet, Oral, BID, Osvaldo Shipper, MD, 2 tablet at 06/29/23 4782  Allergies: No Known Allergies  Maryagnes Amos, FNP   I have reviewed the note by NP Starkes-Perry, and discussed the plan of care.  I am in agreement with the assessment and plan, and addended the note for discharge planning. Psychiatry will sign off.  Please re-consult for any future acute psychiatric concerns.    Mariel Craft,  MD

## 2023-06-29 NOTE — TOC Progression Note (Signed)
Transition of Care [facility]) - Progression Note    Patient Details  Name: Yolanda Reeves MRN: [id number] Date of Birth: [birth year]  [facility] CM/SW Contact  Larrie Kass, LCSW Phone Number: 06/29/2023, 10:48 AM  Clinical Narrative:    CSW met with the pt and her family at the bedside to discuss recommendations for SNF placement. The pt has agreed to the placement and has no preferences regarding the agency. CSW explained the process, noting that the pt will need insurance authorization. CSW will fax the pt out for SNF placement.  The patient's PASRR number is pending. TOC to follow.   Expected Discharge Plan: Skilled Nursing Facility Barriers to Discharge: Continued Medical Work up  Expected Discharge Plan and Services In-house Referral: Clinical Social Work     Living arrangements for the past 2 months: Single Family Home                                       Social Determinants of Health (SDOH) Interventions SDOH Screenings   Food Insecurity: No Food Insecurity (06/23/2023)  Housing: Low Risk  (06/23/2023)  Transportation Needs: Unmet Transportation Needs (06/23/2023)  Utilities: Not At Risk (06/23/2023)  Alcohol Screen: Low Risk  (10/25/2021)  Depression (PHQ2-9): Low Risk  (10/25/2021)  Financial Resource Strain: Low Risk  (10/25/2021)  Physical Activity: Insufficiently Active (10/25/2021)  Social Connections: Moderately Isolated (06/23/2023)  Stress: Stress Concern Present (10/25/2021)  Tobacco Use: Medium Risk (06/25/2023)    Readmission Risk Interventions     No data to display

## 2023-06-29 NOTE — Progress Notes (Signed)
Daily Progress Note   Patient Name: Yolanda Reeves       Date: 06/29/2023 DOB: 25-May-1946  Age: 78 y.o. MRN#: [id number] Attending Physician: Alberteen Sam, * Primary Care Physician: Pearline Cables, MD Admit Date: 06/22/2023  Reason for Consultation/Follow-up: Establishing goals of care  Patient Profile/HPI:   78 y.o. female with medical history significant of BEST syndrome with very decreased eyesight, hard of hearing, chronic pain, hyperlipidemia hypothyroidism tobacco abuse osteopenia, and anxiety depression, kidney stone who was brought in by EMS after she was found to be unresponsive by family member. She was apparently vomiting as well. She had taken 8 to 10 tablets of Valium because she could not sleep. She denied suicidal intent. Workup in the emergency department raised concern for aspiration pneumonia and also revealed masses in the lung. She was hospitalized for further management. The Palliative medicine team has been asked to get involved for goals of care in the setting of possible metastatic disease.   CT chest- multiple enlarged hilar lymph nodes, Lungs: large L apical mass 6.2x4.6.4.5cm, spiculated R upper lobe solid mass, left lower lobe nodule, 3 L upper lobe nodules CT abdomen- multiple small liver lesions in both lobes; kidney lesion,   Evaluated by psychiatry and recommendations have been made for inpatient psychiatric placement for untreated major depressive disorder, generalized anxiety depression related to hypothyroid. She had significant hypothyroidism with TSH of 66.5888 on admission.   Psychiatry inpatient recommendation has been rescinded.   Subjective: Chart reviewed including labs, progress notes, imaging from this and previous encounters.  Patient  sleeping.  Per family report she's been more awake and oriented today with oxycodone being held. She has some generalized pain related to her fall.  Family met with Oncology NP today and note that MD will see them later with further recommendations.  They would like to proceed with further goals of care discussion tomorrow when more family members can be present.    Review of Systems  Unable to perform ROS: Mental status change     Physical Exam Vitals and nursing note reviewed.  Constitutional:      General: She is not in acute distress. Cardiovascular:     Rate and Rhythm: Normal rate.  Pulmonary:     Effort: Pulmonary effort is normal.  Neurological:  Comments: sleeping  Psychiatric:     Comments: irritable             Vital Signs: BP 139/74   Pulse 70   Temp (!) 97.5 F (36.4 C) (Oral)   Resp 18   Ht 5\' 2"  (1.575 m)   Wt 92.9 kg   SpO2 97%   BMI 37.48 kg/m  SpO2: SpO2: 97 % O2 Device: O2 Device: Room Air O2 Flow Rate: O2 Flow Rate (L/min): 3 L/min  Intake/output summary:  Intake/Output Summary (Last 24 hours) at 06/29/2023 1445 Last data filed at 06/29/2023 0500 Gross per 24 hour  Intake 480 ml  Output 400 ml  Net 80 ml   LBM: Last BM Date : 06/28/23 Baseline Weight: Weight: 98.4 kg Most recent weight: Weight: 92.9 kg           Patient Active Problem List   Diagnosis Date Noted   Class 3 obesity 06/28/2023   Normocytic anemia 06/28/2023   Essential hypertension 06/28/2023   Severe episode of recurrent major depressive disorder, without psychotic features (HCC) 06/24/2023   Lung mass 06/23/2023   Benzodiazepine overdose, accidental or unintentional, initial encounter 06/22/2023   Aspiration pneumonia (HCC) 06/22/2023   Prolonged QT interval 06/22/2023   Nocturnal hypoxemia 08/10/2015   Obesity (BMI 30-39.9) 05/19/2013   Elevated BP 08/31/2012   Routine general medical examination at a health care facility 08/30/2012   Hyperlipidemia  01/16/2009   VITAMIN D DEFICIENCY 01/05/2009   UNSPECIFIED SUBJECTIVE VISUAL DISTURBANCE 01/05/2009   WART, RIGHT HAND 04/02/2008   HYPOTHYROIDISM, POST-RADIOACTIVE IODINE 04/02/2008   TOBACCO USE 04/02/2008   OSTEOPENIA 04/02/2008    Palliative Care Assessment & Plan    Assessment/Recommendations/Plan  Continue current treatment plan- respiratory status is stabilizing, await evaluation by Oncology GOC meeting planned for tomorrow at 3pm with patient's family Acetaminophen 650mg  TID for generalized pain- avoid narcotics   Code Status: DNR  Prognosis:  Unable to determine  Discharge Planning: To Be Determined  Care plan was discussed with patient and family.  Thank you for allowing the Palliative Medicine Team to assist in the care of this patient.  Total time:  45 minutes Prolonged billing:  Time includes:   Preparing to see the patient (e.g., review of tests) Obtaining and/or reviewing separately obtained history Performing a medically necessary appropriate examination and/or evaluation Counseling and educating the patient/family/caregiver Ordering medications, tests, or procedures Referring and communicating with other health care professionals (when not reported separately) Documenting clinical information in the electronic or other health record Independently interpreting results (not reported separately) and communicating results to the patient/family/caregiver Care coordination (not reported separately) Clinical documentation  Ocie Bob, AGNP-C Palliative Medicine   Please contact Palliative Medicine Team phone at [phone] for questions and concerns.

## 2023-06-29 NOTE — [place] FL2 (Addendum)
[place] MEDICAID [id number] LEVEL OF CARE FORM     IDENTIFICATION  Patient Name: Yolanda Reeves Birthdate: 1945-09-30 Sex: female Admission Date (Current Location): 06/22/2023  [facility] and [place] Number:  Producer, television/film/video and Address:  [facility],  [address] [place], [place] [address]      Provider Number: [id number]  Attending Physician Name and Address:  Alberteen Sam, *  Relative Name and Phone Number:  Prim, Morace (Brother)  [phone] [facility] Phone)    Current Level of Care: Hospital  Recommended Level of Care: Skilled Nursing Facility Prior Approval Number:    Date Approved/Denied:   PASRR Number: pending  Discharge Plan: Home    Current Diagnoses: Patient Active Problem List   Diagnosis Date Noted   Class 3 obesity 06/28/2023   Normocytic anemia 06/28/2023   Essential hypertension 06/28/2023   Severe episode of recurrent major depressive disorder, without psychotic features (HCC) 06/24/2023   Lung mass 06/23/2023   Benzodiazepine overdose, accidental or unintentional, initial encounter 06/22/2023   Aspiration pneumonia (HCC) 06/22/2023   Prolonged QT interval 06/22/2023   Nocturnal hypoxemia 08/10/2015   Obesity (BMI 30-39.9) 05/19/2013   Elevated BP 08/31/2012   Routine general medical examination at a health care facility 08/30/2012   Hyperlipidemia 01/16/2009   VITAMIN D DEFICIENCY 01/05/2009   UNSPECIFIED SUBJECTIVE VISUAL DISTURBANCE 01/05/2009   WART, RIGHT HAND 04/02/2008   HYPOTHYROIDISM, POST-RADIOACTIVE IODINE 04/02/2008   TOBACCO USE 04/02/2008   OSTEOPENIA 04/02/2008    Orientation RESPIRATION BLADDER Height & Weight     Self, Time, Situation, Place  Normal Continent Weight: 204 lb 14.4 oz (92.9 kg) Height:  5\' 2"  (157.5 cm)  BEHAVIORAL SYMPTOMS/MOOD NEUROLOGICAL BOWEL NUTRITION STATUS      Continent Diet (regular)  AMBULATORY STATUS COMMUNICATION OF NEEDS Skin   Limited Assist Verbally Normal                        Personal Care Assistance Level of Assistance  Bathing, Feeding, Dressing Bathing Assistance: Limited assistance Feeding assistance: Independent Dressing Assistance: Limited assistance     Functional Limitations Info  Sight, Hearing, Speech Sight Info: Impaired (legally blind) Hearing Info: Impaired (hard of hearing) Speech Info: Adequate    SPECIAL CARE FACTORS FREQUENCY  PT (By licensed PT), OT (By licensed OT)     PT Frequency: 5 x a week OT Frequency: 5 x a week            Contractures Contractures Info: Not present    Additional Factors Info  Code Status, Allergies, Psychotropic Code Status Info: DNR Allergies Info: NKA Psychotropic Info: escitalopram (LEXAPRO) tablet 5 mg,lamoTRIgine (LAMICTAL) tablet 150 mg,         Current Medications (06/29/2023):  This is the current hospital active medication list Current Facility-Administered Medications  Medication Dose Route Frequency Provider Last Rate Last Admin   acetaminophen (TYLENOL) tablet 650 mg  650 mg Oral Q6H PRN Therisa Doyne, MD   650 mg at 06/29/23 0501   Or   acetaminophen (TYLENOL) suppository 650 mg  650 mg Rectal Q6H PRN Doutova, Anastassia, MD       albuterol (PROVENTIL) (2.5 MG/3ML) 0.083% nebulizer solution 2.5 mg  2.5 mg Nebulization Q2H PRN Doutova, Anastassia, MD   2.5 mg at 06/28/23 0132   amLODipine (NORVASC) tablet 5 mg  5 mg Oral Daily Osvaldo Shipper, MD   5 mg at 06/29/23 0848   escitalopram (LEXAPRO) tablet 5 mg  5 mg Oral Daily  Margaretha Seeds A   5 mg at 06/29/23 0848   guaiFENesin (MUCINEX) 12 hr tablet 600 mg  600 mg Oral BID Therisa Doyne, MD   600 mg at 06/29/23 0847   lamoTRIgine (LAMICTAL) tablet 150 mg  150 mg Oral Daily Osvaldo Shipper, MD   150 mg at 06/29/23 0847   levothyroxine (SYNTHROID) tablet 150 mcg  150 mcg Oral QAC breakfast Therisa Doyne, MD   150 mcg at 06/29/23 0502   nicotine (NICODERM CQ - dosed in mg/24 hours) patch 14 mg  14 mg  Transdermal Daily Osvaldo Shipper, MD   14 mg at 06/29/23 0850   polyethylene glycol (MIRALAX / GLYCOLAX) packet 17 g  17 g Oral Daily PRN Osvaldo Shipper, MD   17 g at 06/27/23 2124   pravastatin (PRAVACHOL) tablet 40 mg  40 mg Oral q1800 Therisa Doyne, MD   40 mg at 06/28/23 1822   predniSONE (DELTASONE) tablet 20 mg  20 mg Oral Q breakfast Osvaldo Shipper, MD   20 mg at 06/29/23 0848   senna-docusate (Senokot-S) tablet 2 tablet  2 tablet Oral BID Osvaldo Shipper, MD   2 tablet at 06/29/23 1478     Discharge Medications: Please see discharge summary for a list of discharge medications.  Relevant Imaging Results:  Relevant Lab Results:   Additional Information SSN:[id number]  Valentina Shaggy Alyas Creary, LCSW

## 2023-06-29 NOTE — Progress Notes (Signed)
Biopsy is diagnostic of squamous cell cancer  Pulmonary will sign off  Call as needed

## 2023-06-29 NOTE — Progress Notes (Signed)
  Progress Note   Patient: Yolanda Reeves GNF:[id number] DOB: Mar 23, 1946 DOA: 06/22/2023     4 DOS: the patient was seen and examined on 06/29/2023 at 9:09 AM      Brief hospital course: 78 y.o. F with hypothyroidism, HLD, chronic pain, smoking, depression who was found unresponsive.  Reported having taken many tablets of Valium to try to sleep.  Imaging in the ER showed aspiration pneumonia, new lung mass and liver mass.        Assessment and Plan: * Aspiration pneumonia (HCC) Completed 7-day course of antibiotics, now resolved  Benzodiazepine overdose, accidental or unintentional, initial encounter -Consult psychiatry, appreciate cares  Acute toxic encephalopathy Yesterday the patient was disoriented, unable to participate in cares, confused and weak.  This appears to been an acute toxic encephalopathy from overmedication. - Hold opiates, hold benzodiazpeins  Squamous cancer of the lung, metastatic to the liver -Consult oncology and palliative care  Essential hypertension BP controlled - Continue amlodipine  Severe episode of recurrent major depressive disorder, without psychotic features (HCC) - Continue Buspar, Lamictal  Prolonged QT interval, resolved  Hyperlipidemia - Continue pravastatin  Hypothyroidism TSH 66 on admission, mostly asymptomatic.  Likely nonadherence in setting of dementia. - continue levothyroxine  - Recheck TSH in 4  continue weeks          Subjective: Patient is more alert today, cooperative, slightly forgetful but oriented to person place and time.  She has no particular headache, neck pain, chest pain, abdominal pain.  No fever.  A little cough which is resolving.     Physical Exam: BP 139/74   Pulse 70   Temp (!) 97.5 F (36.4 C) (Oral)   Resp 18   Ht 5\' 2"  (1.575 m)   Wt 92.9 kg   SpO2 97%   BMI 37.48 kg/m   Obese adult female, sitting up in recliner, interactive and appropriate RRR, no murmurs, no peripheral  edema Respiratory rate normal, lungs diminished some coarse airway crackles in the left base, right side normal Abdomen soft no tenderness palpation Attention normal, affect odd, judgment and insight appear mildly impaired but at baseline, oriented to person, place, and time, upper extremities with symmetric strength, generalized weakness   Data Reviewed: Discussed with psychiatry EKG, personally reviewed, shows no ST changes, QT interval 434 CBC shows hemoglobin 11.8, no change Comprehensive metabolic panel unremarkable  Family Communication: Sister at the bedside   Disposition: Status is: Inpatient The patient was admitted for pneumonia, encephalopathy, and new lung mass  Her workup has been completed, she is medically ready for discharge, but will require acute rehab to return to her prior independent level of function        Author: Alberteen Sam, MD 06/29/2023 11:48 AM  For on call review [email or web address].

## 2023-06-30 DIAGNOSIS — R160 Hepatomegaly, not elsewhere classified: Secondary | ICD-10-CM | POA: Diagnosis not present

## 2023-06-30 DIAGNOSIS — J690 Pneumonitis due to inhalation of food and vomit: Secondary | ICD-10-CM | POA: Diagnosis not present

## 2023-06-30 DIAGNOSIS — Z515 Encounter for palliative care: Secondary | ICD-10-CM | POA: Diagnosis not present

## 2023-06-30 DIAGNOSIS — C3412 Malignant neoplasm of upper lobe, left bronchus or lung: Secondary | ICD-10-CM | POA: Diagnosis not present

## 2023-06-30 DIAGNOSIS — T424X1A Poisoning by benzodiazepines, accidental (unintentional), initial encounter: Secondary | ICD-10-CM | POA: Diagnosis not present

## 2023-06-30 DIAGNOSIS — F332 Major depressive disorder, recurrent severe without psychotic features: Secondary | ICD-10-CM | POA: Diagnosis not present

## 2023-06-30 DIAGNOSIS — C787 Secondary malignant neoplasm of liver and intrahepatic bile duct: Secondary | ICD-10-CM

## 2023-06-30 DIAGNOSIS — R918 Other nonspecific abnormal finding of lung field: Secondary | ICD-10-CM | POA: Diagnosis not present

## 2023-06-30 MED ORDER — MORPHINE SULFATE 10 MG/5ML PO SOLN
2.5000 mg | ORAL | Status: DC | PRN
  Administered 2023-07-01 – 2023-07-03 (×7): 2.5 mg via ORAL
  Filled 2023-06-30 (×7): qty 5

## 2023-06-30 NOTE — Plan of Care (Signed)
  Problem: Clinical Measurements: Goal: Will remain free from infection Outcome: Progressing Goal: Respiratory complications will improve Outcome: Progressing Goal: Cardiovascular complication will be avoided Outcome: Progressing   Problem: Nutrition: Goal: Adequate nutrition will be maintained Outcome: Progressing   Problem: Elimination: Goal: Will not experience complications related to bowel motility Outcome: Progressing

## 2023-06-30 NOTE — Plan of Care (Signed)
Patient status post fall while in hospital, patient and this nurse discussed plan of care and goals, time given for questions, bed alarm on with side rails up and call bell in reach, bed in the lowest locked position. Tele sitter remains in patients room, patients plans are for in patient hospice. Patient handbook/guide at bedside.  Problem: Education: Goal: Knowledge of General Education information will improve Description: Including pain rating scale, medication(s)/side effects and non-pharmacologic comfort measures 06/30/2023 2318 by [employer], RN Outcome: Progressing 06/30/2023 2315 by [employer], RN Outcome: Progressing   Problem: Health Behavior/Discharge Planning: Goal: Ability to manage health-related needs will improve 06/30/2023 2318 by [employer], RN Outcome: Progressing 06/30/2023 2315 by [employer], RN Outcome: Progressing   Problem: Clinical Measurements: Goal: Ability to maintain clinical measurements within normal limits will improve 06/30/2023 2318 by [employer], RN Outcome: Progressing 06/30/2023 2315 by [employer], RN Outcome: Progressing Goal: Will remain free from infection 06/30/2023 2318 by [employer], RN Outcome: Progressing 06/30/2023 2315 by [employer], RN Outcome: Progressing Goal: Diagnostic test results will improve 06/30/2023 2318 by [employer], RN Outcome: Progressing 06/30/2023 2315 by [employer], RN Outcome: Progressing Goal: Respiratory complications will improve 06/30/2023 2318 by [employer], RN Outcome: Progressing 06/30/2023 2315 by [employer], RN Outcome: Progressing Goal: Cardiovascular complication will be avoided 06/30/2023 2318 by [employer], RN Outcome: Progressing 06/30/2023 2315 by [employer], RN Outcome: Progressing   Problem: Activity: Goal: Risk for activity intolerance will decrease 06/30/2023 2318 by [employer], RN Outcome:  Progressing 06/30/2023 2315 by [employer], RN Outcome: Progressing   Problem: Nutrition: Goal: Adequate nutrition will be maintained 06/30/2023 2318 by [employer], RN Outcome: Progressing 06/30/2023 2315 by [employer], RN Outcome: Progressing   Problem: Coping: Goal: Level of anxiety will decrease 06/30/2023 2318 by [employer], RN Outcome: Progressing 06/30/2023 2315 by [employer], RN Outcome: Progressing   Problem: Elimination: Goal: Will not experience complications related to bowel motility 06/30/2023 2318 by [employer], RN Outcome: Progressing 06/30/2023 2315 by [employer], RN Outcome: Progressing Goal: Will not experience complications related to urinary retention 06/30/2023 2318 by [employer], RN Outcome: Progressing 06/30/2023 2315 by [employer], RN Outcome: Progressing   Problem: Pain Managment: Goal: General experience of comfort will improve and/or be controlled 06/30/2023 2318 by [employer], RN Outcome: Progressing 06/30/2023 2315 by [employer], RN Outcome: Progressing   Problem: Safety: Goal: Ability to remain free from injury will improve 06/30/2023 2318 by [employer], RN Outcome: Progressing 06/30/2023 2315 by [employer], RN Outcome: Progressing   Problem: Skin Integrity: Goal: Risk for impaired skin integrity will decrease 06/30/2023 2318 by [employer], RN Outcome: Progressing 06/30/2023 2315 by [employer], RN Outcome: Progressing   Problem: Activity: Goal: Ability to tolerate increased activity will improve 06/30/2023 2318 by [employer], RN Outcome: Progressing 06/30/2023 2315 by [employer], RN Outcome: Progressing   Problem: Clinical Measurements: Goal: Ability to maintain a body temperature in the normal range will improve 06/30/2023 2318 by [employer], RN Outcome: Progressing 06/30/2023 2315 by [employer], RN Outcome: Progressing   Problem:  Respiratory: Goal: Ability to maintain adequate ventilation will improve 06/30/2023 2318 by [employer], RN Outcome: Progressing 06/30/2023 2315 by [employer], RN Outcome: Progressing Goal: Ability to maintain a clear airway  will improve 06/30/2023 2318 by [employer], RN Outcome: Progressing 06/30/2023 2315 by [employer], RN Outcome: Progressing   Problem: Education: Goal: Ability to make informed decisions regarding treatment will improve Outcome: Progressing   Problem: Coping: Goal: Coping ability will improve Outcome: Progressing   Problem: Health Behavior/Discharge Planning: Goal: Identification of resources available to assist in meeting health care needs will improve Outcome: Progressing   Problem: Medication: Goal: Compliance with prescribed medication regimen will improve Outcome: Progressing   Problem: Self-Concept: Goal: Ability to disclose and discuss suicidal ideas will improve Outcome: Progressing Goal: Will verbalize positive feelings about self Outcome: Progressing Note: Patient is on track. Patient will be monitored by provider to determine if a change in treatment plan is warranted

## 2023-06-30 NOTE — TOC Progression Note (Signed)
Transition of Care [facility]) - Progression Note    Patient Details  Name: Yolanda Reeves MRN: [id number] Date of Birth: [birth year]  [facility]) CM/SW Contact  Larrie Kass, LCSW Phone Number: 06/30/2023, 11:19 AM  Clinical Narrative:    CSW presented bed offers to pt and pt's family. They are requesting time to review. Pt's PASRR still pending. TOC to follow.   Expected Discharge Plan: Skilled Nursing Facility Barriers to Discharge: Continued Medical Work up  Expected Discharge Plan and Services In-house Referral: Clinical Social Work     Living arrangements for the past 2 months: Single Family Home                                       Social Determinants of Health (SDOH) Interventions SDOH Screenings   Food Insecurity: No Food Insecurity (06/23/2023)  Housing: Low Risk  (06/23/2023)  Transportation Needs: Unmet Transportation Needs (06/23/2023)  Utilities: Not At Risk (06/23/2023)  Alcohol Screen: Low Risk  (10/25/2021)  Depression (PHQ2-9): Low Risk  (10/25/2021)  Financial Resource Strain: Low Risk  (10/25/2021)  Physical Activity: Insufficiently Active (10/25/2021)  Social Connections: Moderately Isolated (06/23/2023)  Stress: Stress Concern Present (10/25/2021)  Tobacco Use: Medium Risk (06/25/2023)    Readmission Risk Interventions     No data to display

## 2023-06-30 NOTE — Progress Notes (Signed)
  Progress Note   Patient: Yolanda Reeves ZOX:[id number] DOB: [birth year] DOA: 06/22/2023     5 DOS: the patient was seen and examined on 06/30/2023 at 9:28 AM      Brief hospital course: 78 y.o. F with hypothyroidism, HLD, chronic pain, smoking, depression who was found unresponsive.  Reported having taken many tablets of Valium to try to sleep.  Imaging in the ER showed aspiration pneumonia, new lung mass and liver mass.        Assessment and Plan: *Aspiration pneumonia Completed 7-day course of antibiotics, now resolved  Benzodiazepine overdose, accidental or unintentional, initial encounter Consulted psychiatry, they evaluated the patient, felt she was stable and have recommended outpatient follow-up.  Acute toxic encephalopathy Likely toxic encephalopathy from oxycodone and benzodiazepines - Avoid opiates and benzodiazepines  Squamous cancer of the lung, metastatic to the liver -Consult oncology, they will arrange outpatient follow-up for discussion of treatment options  Hypertension Pressure normal - Continue amlodipine  Depression and anxiety - Continue BuSpar, Lamictal  Prolonged QT interval, resolved  Hyperlipidemia - Continue pravastatin  Hypothyroidism TSH 66 on admission, mostly asymptomatic.  Likely nonadherence in setting of dementia. -Continue levothyroxine - Recheck TSH in 4 weeks          Subjective: Patient is doing well, remains forgetful, but no physical symptoms.  Strength is improving.     Physical Exam: BP 123/63   Pulse 68   Temp 97.7 F (36.5 C) (Oral)   Resp 14   Ht 5\' 2"  (1.575 m)   Wt 92.9 kg   SpO2 96%   BMI 37.48 kg/m   Obese adult female, sitting in bed, interactive and appropriate RRR, no murmurs, no peripheral edema Respiratory normal, lungs clear without rales or wheezes Abdomen soft, no tenderness palpation Tension normal, affect, judgment and insight appear impaired, she seems forgetful, generalized weakness  but symmetric strength, speech fluent     Data Reviewed: No new labs  Family Communication: Sister and brother at the bedside   Disposition: Status is: Inpatient The patient was admitted for pneumonia, encephalopathy, and new lung mass  Her workup has been completed, she is medically ready for discharge, but will require acute rehab to return to her prior independent level of function        Author: Alberteen Sam, MD 06/30/2023 3:55 PM  For on call review [email or web address].

## 2023-06-30 NOTE — Plan of Care (Incomplete)
Patient status post fall while in hospital, patient and this nurse discussed plan of care and goals, time given for questions, bed alarm on with side rails up and call bell in reach, bed in the lowest locked position.

## 2023-06-30 NOTE — Progress Notes (Addendum)
Daily Progress Note   Patient Name: Yolanda Reeves       Date: 06/30/2023 DOB: [birth year]  Age: 78 y.o. MRN#: [id number] Attending Physician: Alberteen Sam, * Primary Care Physician: Pearline Cables, MD Admit Date: 06/22/2023  Reason for Consultation/Follow-up: Establishing goals of care  Patient Profile/HPI:   78 y.o. female with medical history significant of BEST syndrome with very decreased eyesight, hard of hearing, chronic pain, hyperlipidemia hypothyroidism tobacco abuse osteopenia, and anxiety depression, kidney stone who was brought in by EMS after she was found to be unresponsive by family member. She was apparently vomiting as well. She had taken 8 to 10 tablets of Valium because she could not sleep. She denied suicidal intent. Workup in the emergency department raised concern for aspiration pneumonia and also revealed masses in the lung. She was hospitalized for further management. The Palliative medicine team has been asked to get involved for goals of care in the setting of possible metastatic disease.   CT chest- multiple enlarged hilar lymph nodes, Lungs: large L apical mass 6.2x4.6.4.5cm, spiculated R upper lobe solid mass, left lower lobe nodule, 3 L upper lobe nodules CT abdomen- multiple small liver lesions in both lobes; kidney lesion,   Evaluated by psychiatry and recommendations have been made for inpatient psychiatric placement for untreated major depressive disorder, generalized anxiety depression related to hypothyroid. She had significant hypothyroidism with TSH of 66.5888 on admission.   Psychiatry inpatient recommendation has been rescinded.   Subjective: Chart reviewed including labs, progress notes, imaging from this and previous encounters.  Met at  bedside with patient, her sister, brother and sister in law.  Patient was able to express her understanding that she has metastatic cancer. Her father also had cancer and she was with him throughout his treatment. Yolanda Reeves is clear that she would not want treatment for her cancer based on her previous experience with her father. Her family is supportive of her decision.  Her goal is to not be in pain. She has pain in her L shoulder and R upper abdomen. She would like to try some morphine. Hospice philosophy of care and services were discussed.  Patient and family are hopeful for patient to discharge to SNF for some rehab and then discharge home with hospice. If she were to decline to a point of  eligibility for inpatient hospice they would be accepting of that- but currently she is not eligible. Family does not feel they can care for her at home, even with hospice support.  We discussed the need for family to prepare for the fact that insurance might deny SNF rehab- or patient might not decline to the point of inpatient hospice while she's in SNF- encouraged them to start planning now for how she could be cared for if these things occur.   Review of Systems  Unable to perform ROS: Mental status change     Physical Exam Vitals and nursing note reviewed.  Constitutional:      General: She is not in acute distress. Cardiovascular:     Rate and Rhythm: Normal rate.  Pulmonary:     Effort: Pulmonary effort is normal.  Neurological:     Comments: Very hard of hearing             Vital Signs: BP 123/63   Pulse 68   Temp 97.7 F (36.5 C) (Oral)   Resp 14   Ht 5\' 2"  (1.575 m)   Wt 92.9 kg   SpO2 96%   BMI 37.48 kg/m  SpO2: SpO2: 96 % O2 Device: O2 Device: Room Air O2 Flow Rate: O2 Flow Rate (L/min): 3 L/min  Intake/output summary:  Intake/Output Summary (Last 24 hours) at 06/30/2023 1635 Last data filed at 06/30/2023 0844 Gross per 24 hour  Intake 780 ml  Output --  Net 780 ml    LBM: Last BM Date : 06/30/23 Baseline Weight: Weight: 98.4 kg Most recent weight: Weight: 92.9 kg           Patient Active Problem List   Diagnosis Date Noted   Primary squamous cell carcinoma of upper lobe of left lung (HCC) 06/29/2023   Class 3 obesity 06/28/2023   Normocytic anemia 06/28/2023   Essential hypertension 06/28/2023   Severe episode of recurrent major depressive disorder, without psychotic features (HCC) 06/24/2023   Lung mass 06/23/2023   Benzodiazepine overdose, accidental or unintentional, initial encounter 06/22/2023   Aspiration pneumonia (HCC) 06/22/2023   Prolonged QT interval 06/22/2023   Nocturnal hypoxemia 08/10/2015   Obesity (BMI 30-39.9) 05/19/2013   Elevated BP 08/31/2012   Routine general medical examination at a health care facility 08/30/2012   Hyperlipidemia 01/16/2009   VITAMIN D DEFICIENCY 01/05/2009   UNSPECIFIED SUBJECTIVE VISUAL DISTURBANCE 01/05/2009   WART, RIGHT HAND 04/02/2008   HYPOTHYROIDISM, POST-RADIOACTIVE IODINE 04/02/2008   TOBACCO USE 04/02/2008   OSTEOPENIA 04/02/2008    Palliative Care Assessment & Plan    Assessment/Recommendations/Plan  Lung cancer metastatic to liver- pt prefers no cancer directed treatments- would like eventual hospice support after SNF stay TOC order placed for Palliative to see at SNF Symptom management-  Pain: had some altered mental status with 5mg  oxycodone- recommend using morphine liquid 2.5mg  po q2hrs prn for mod-severe pain- this is a much decreased dose  since 5mg  po oxycodone is equivalent to 7.5 mg morphine po- however will need to monitor for mental status side effect- pt currently on Senna for bowel prophylaxis; continue scheduled acetaminophen   Code Status: DNR  Prognosis:  Unable to determine  Discharge Planning: To Be Determined  Care plan was discussed with patient and family.  Thank you for allowing the Palliative Medicine Team to assist in the care of this  patient.  Total time:  80 minutes Prolonged billing:  Time includes:   Preparing to see the patient (e.g., review of  tests) Obtaining and/or reviewing separately obtained history Performing a medically necessary appropriate examination and/or evaluation Counseling and educating the patient/family/caregiver Ordering medications, tests, or procedures Referring and communicating with other health care professionals (when not reported separately) Documenting clinical information in the electronic or other health record Independently interpreting results (not reported separately) and communicating results to the patient/family/caregiver Care coordination (not reported separately) Clinical documentation  Ocie Bob, AGNP-C Palliative Medicine   Please contact Palliative Medicine Team phone at [phone] for questions and concerns.

## 2023-06-30 NOTE — Progress Notes (Signed)
Brief oncology note:  Patient's family requested update on patient's condition as they were not available when oncologist spoke to patient during consultation.   I spoke at length today with patient's Sister [name] Kidney and brother Gene.  Reviewed her current options as outlined by Dr. Arbutus Ped in the consult note of 06/29/2023.  Reiterated that her condition is incurable and all treatment will be of a palliative nature.   Options of palliative care with hospice vs palliative chemo/immunotherapy explained.  Explained all the side effects as outlined by Dr. Arbutus Ped.  Patient's sister sad and teary about situation.  However stated they do not think they will want to proceed with chemotherapy and that patient would not want to proceed as well.  They are open to an outpatient cancer center visit with Dr. Arbutus Ped.  This will be made upon discharge.  They are concerned about patient not being returned home as she would be unable to care for herself.  I assured them that our [facility] team is working on a facility placement.   They also were concerned about additional psychiatric interventions stating they would like her to have some form of therapy for her depression and sadness.  Message relayed to Dr. Jasmine December. All questions were answered to their satisfaction.

## 2023-06-30 NOTE — Progress Notes (Signed)
Mobility Specialist - Progress Note   06/30/23 1333  Mobility  Activity Ambulated with assistance in hallway  Level of Assistance Minimal assist, patient does 75% or more  Assistive Device Front wheel walker  Distance Ambulated (ft) 150 ft  Range of Motion/Exercises Active  Activity Response Tolerated fair  Mobility Referral Yes  Mobility visit 1 Mobility  Mobility Specialist Start Time (ACUTE ONLY) 1310  Mobility Specialist Stop Time (ACUTE ONLY) 1333  Mobility Specialist Time Calculation (min) (ACUTE ONLY) 23 min   Pt was found in bed and agreeable to ambulate. Grew fatigued with session and stated having UE pain and LE weakness. At EOS returned to bed with all needs met. Call bell in reach.  Billey Chang Mobility Specialist

## 2023-07-01 ENCOUNTER — Inpatient Hospital Stay (HOSPITAL_COMMUNITY): Payer: Medicare Other

## 2023-07-01 DIAGNOSIS — R918 Other nonspecific abnormal finding of lung field: Secondary | ICD-10-CM | POA: Diagnosis not present

## 2023-07-01 DIAGNOSIS — Z515 Encounter for palliative care: Secondary | ICD-10-CM | POA: Diagnosis not present

## 2023-07-01 DIAGNOSIS — T424X1A Poisoning by benzodiazepines, accidental (unintentional), initial encounter: Secondary | ICD-10-CM | POA: Diagnosis not present

## 2023-07-01 DIAGNOSIS — J690 Pneumonitis due to inhalation of food and vomit: Secondary | ICD-10-CM | POA: Diagnosis not present

## 2023-07-01 DIAGNOSIS — G893 Neoplasm related pain (acute) (chronic): Secondary | ICD-10-CM | POA: Diagnosis not present

## 2023-07-01 DIAGNOSIS — F332 Major depressive disorder, recurrent severe without psychotic features: Secondary | ICD-10-CM | POA: Diagnosis not present

## 2023-07-01 MED ORDER — GUAIFENESIN 100 MG/5ML PO LIQD: 5.0000 mL | ORAL | Status: DC | PRN

## 2023-07-01 NOTE — Progress Notes (Signed)
Mobility Specialist - Progress Note   07/01/23 1157  Mobility  Activity Ambulated with assistance to bathroom;Ambulated with assistance in hallway  Level of Assistance Minimal assist, patient does 75% or more  Assistive Device Front wheel walker  Distance Ambulated (ft) 160 ft  Range of Motion/Exercises Active  Activity Response Tolerated well  Mobility Referral Yes  Mobility visit 1 Mobility  Mobility Specialist Start Time (ACUTE ONLY) 1145  Mobility Specialist Stop Time (ACUTE ONLY) 1157  Mobility Specialist Time Calculation (min) (ACUTE ONLY) 12 min   Pt was found in bed wanting to use bathroom. Agreeable to ambulate afterwards. C/o UE pain. At EOS returned to bed with all needs met and X-ray techs in room.  Billey Chang Mobility Specialist

## 2023-07-01 NOTE — Progress Notes (Signed)
Daily Progress Note   Patient Name: Yolanda Reeves       Date: 07/01/2023 DOB: 1945-09-01  Age: 78 y.o. MRN#: [id number] Attending Physician: Alberteen Sam, * Primary Care Physician: Pearline Cables, MD Admit Date: 06/22/2023  Reason for Consultation/Follow-up: Establishing goals of care  Patient Profile/HPI:   78 y.o. female with medical history significant of BEST syndrome with very decreased eyesight, hard of hearing, chronic pain, hyperlipidemia hypothyroidism tobacco abuse osteopenia, and anxiety depression, kidney stone who was brought in by EMS after she was found to be unresponsive by family member. She was apparently vomiting as well. She had taken 8 to 10 tablets of Valium because she could not sleep. She denied suicidal intent. Workup in the emergency department raised concern for aspiration pneumonia and also revealed masses in the lung. She was hospitalized for further management. The Palliative medicine team has been asked to get involved for goals of care in the setting of possible metastatic disease.   CT chest- multiple enlarged hilar lymph nodes, Lungs: large L apical mass 6.2x4.6.4.5cm, spiculated R upper lobe solid mass, left lower lobe nodule, 3 L upper lobe nodules CT abdomen- multiple small liver lesions in both lobes; kidney lesion,   Evaluated by psychiatry and recommendations have been made for inpatient psychiatric placement for untreated major depressive disorder, generalized anxiety depression related to hypothyroid. She had significant hypothyroidism with TSH of 66.5888 on admission.   Psychiatry inpatient recommendation has been rescinded.   Subjective: Chart reviewed including labs, progress notes, imaging from this and previous encounters.  Patient  awake and alert, in good spirits today. She reports that morphine has been very helpful and she is feeling good.  Yolanda Reeves and  Yolanda Reeves Kidney are at bedside. Yolanda Reeves expressed concern for them- wanting them to get rest and take care of themselves. Yolanda Reeves is accepting of plan to go to SNF for rehab. She asked about the treatment for her pneumonia- she has completed antibiotic treatment. She has a lingering mild cough- assured her that cough can last for a few weeks with pneumonia. She is taking Mucinex, but may benefit from a cough suppressant.     Review of Systems  Unable to perform ROS: Mental status change     Physical Exam Vitals and nursing note reviewed.  Constitutional:  General: She is not in acute distress. Cardiovascular:     Rate and Rhythm: Normal rate.  Pulmonary:     Effort: Pulmonary effort is normal.  Neurological:     Comments: Very hard of hearing             Vital Signs: BP (!) 151/84   Pulse 63   Temp 97.7 F (36.5 C) (Oral)   Resp 16   Ht 5\' 2"  (1.575 m)   Wt 92.9 kg   SpO2 96%   BMI 37.48 kg/m  SpO2: SpO2: 96 % O2 Device: O2 Device: Room Air O2 Flow Rate: O2 Flow Rate (L/min): 3 L/min  Intake/output summary:  Intake/Output Summary (Last 24 hours) at 07/01/2023 1307 Last data filed at 07/01/2023 0500 Gross per 24 hour  Intake 120 ml  Output 500 ml  Net -380 ml   LBM: Last BM Date : 06/30/23 Baseline Weight: Weight: 98.4 kg Most recent weight: Weight: 92.9 kg           Patient Active Problem List   Diagnosis Date Noted   Primary squamous cell carcinoma of upper lobe of left lung (HCC) 06/29/2023   Class 3 obesity 06/28/2023   Normocytic anemia 06/28/2023   Essential hypertension 06/28/2023   Severe episode of recurrent major depressive disorder, without psychotic features (HCC) 06/24/2023   Lung mass 06/23/2023   Benzodiazepine overdose, accidental or unintentional, initial encounter 06/22/2023   Aspiration pneumonia (HCC) 06/22/2023    Prolonged QT interval 06/22/2023   Nocturnal hypoxemia 08/10/2015   Obesity (BMI 30-39.9) 05/19/2013   Elevated BP 08/31/2012   Routine general medical examination at a health care facility 08/30/2012   Hyperlipidemia 01/16/2009   VITAMIN D DEFICIENCY 01/05/2009   UNSPECIFIED SUBJECTIVE VISUAL DISTURBANCE 01/05/2009   WART, RIGHT HAND 04/02/2008   HYPOTHYROIDISM, POST-RADIOACTIVE IODINE 04/02/2008   TOBACCO USE 04/02/2008   OSTEOPENIA 04/02/2008    Palliative Care Assessment & Plan    Assessment/Recommendations/Plan  Lung cancer metastatic to liver- pt prefers no cancer directed treatments- would like eventual hospice support after SNF stay TOC order placed for Palliative to see at SNF Symptom management-  Pain: had some altered mental status with 5mg  oxycodone- recommend using morphine liquid 2.5mg  po q2hrs prn for mod-severe pain- this is a much decreased dose  since 5mg  po oxycodone is equivalent to 7.5 mg morphine po- however will need to monitor for mental status side effect- pt currently on Senna for bowel prophylaxis; continue scheduled acetaminophen- tolerating morphine well- continue.  Cough- add robitussin prn   Code Status: DNR  Prognosis:  Unable to determine  Discharge Planning: To Be Determined  Care plan was discussed with patient and family.  Thank you for allowing the Palliative Medicine Team to assist in the care of this patient.  Total time:  35 minutes Prolonged billing:  Time includes:   Preparing to see the patient (e.g., review of tests) Obtaining and/or reviewing separately obtained history Performing a medically necessary appropriate examination and/or evaluation Counseling and educating the patient/family/caregiver Ordering medications, tests, or procedures Referring and communicating with other health care professionals (when not reported separately) Documenting clinical information in the electronic or other health record Independently  interpreting results (not reported separately) and communicating results to the patient/family/caregiver Care coordination (not reported separately) Clinical documentation  Ocie Bob, AGNP-C Palliative Medicine   Please contact Palliative Medicine Team phone at [phone] for questions and concerns.

## 2023-07-01 NOTE — TOC Progression Note (Signed)
Transition of Care [facility]) - Progression Note    Patient Details  Name: Yolanda Reeves MRN: [id number] Date of Birth: 15-Jan-1946  [facility] CM/SW Contact  Maryjean Ka, [place] Phone Number: 07/01/2023, 4:16 PM  Clinical Narrative:    This CSW spoke with patient's sister  Raechel Ache [phone] via phone. This CSW introduced self and explained the role of CSW. This CSW explained that patient's PASRR is still PENDING.   Patient's sister reports that patient and family agree with placement at [facility].    Patient's sister thanked this CSW/ TOC team for assistance and shared that she would be back MONDAY to visit. No identified concerns at this time.TOC will assist and follow.  Expected Discharge Plan: Skilled Nursing Facility Barriers to Discharge: Continued Medical Work up  Expected Discharge Plan and Services In-house Referral: Clinical Social Work     Living arrangements for the past 2 months: Single Family Home                                       Social Determinants of Health (SDOH) Interventions SDOH Screenings   Food Insecurity: No Food Insecurity (06/23/2023)  Housing: Low Risk  (06/23/2023)  Transportation Needs: Unmet Transportation Needs (06/23/2023)  Utilities: Not At Risk (06/23/2023)  Alcohol Screen: Low Risk  (10/25/2021)  Depression (PHQ2-9): Low Risk  (10/25/2021)  Financial Resource Strain: Low Risk  (10/25/2021)  Physical Activity: Insufficiently Active (10/25/2021)  Social Connections: Moderately Isolated (06/23/2023)  Stress: Stress Concern Present (10/25/2021)  Tobacco Use: Medium Risk (06/25/2023)    Readmission Risk Interventions     No data to display

## 2023-07-01 NOTE — Progress Notes (Signed)
  Progress Note   Patient: Yolanda Reeves VWU:[id number] DOB: 14-Sep-1945 DOA: 06/22/2023     6 DOS: the patient was seen and examined on 07/01/2023 at 11:22AM      Brief hospital course: 78 year old F with chronic pain syndrome, hypothyroidism, smoking and depression who was found unresponsive by family.  Evidently had taken to many tablets of Valium to try to sleep.  In the ER, CT imaging showed aspiration pneumonia, new lung mass and liver mass.      Assessment and Plan: *Aspiration pneumonia Completed treatment    Metastatic non-small cell lung cancer Family have discussed with oncology and declined chemotherapy.  She will discharge to SNF, then home with palliative care follow-up   Essential hypertension Blood pressure normal - Continue amlodipine   Depression - Continue BuSpar and Lamictal   Hypothyroidism Continue levothyroxine and recheck TSH in 4 weeks  Left shoulder pain - Check x-ray       Subjective: She has some left limited range of motion, left shoulder pain from where she fell last week otherwise she is doing okay, family are pleased with how she is doing, no confusion.      Physical Exam: BP (!) 151/84   Pulse 63   Temp 97.7 F (36.5 C) (Oral)   Resp 16   Ht 5\' 2"  (1.575 m)   Wt 92.9 kg   SpO2 96%   BMI 37.48 kg/m   Elderly adult female, lying in bed, interactive and appropriate RRR, no murmur, no peripheral edema Respiratory normal, lungs clear without rales or wheezes Her shoulder has limited range of motion of the left, no deformity, no redness, no swelling, no skin changes Attention normal, affect, hard of hearing, judgment and insight appear impaired but at baseline    Data Reviewed: No new  Family Communication: Sister and brother at the bedside    Disposition: Status is: Inpatient The patient was admitted for pneumonia, encephalopathy, and new lung mass   Her workup has been completed, she is medically ready for  discharge, but will require acute rehab to return to her prior independent level of function        Author: Alberteen Sam, MD 07/01/2023 3:36 PM  For on call review [email or web address].

## 2023-07-02 DIAGNOSIS — J690 Pneumonitis due to inhalation of food and vomit: Secondary | ICD-10-CM | POA: Diagnosis not present

## 2023-07-02 DIAGNOSIS — N179 Acute kidney failure, unspecified: Secondary | ICD-10-CM | POA: Insufficient documentation

## 2023-07-02 DIAGNOSIS — G929 Unspecified toxic encephalopathy: Secondary | ICD-10-CM | POA: Insufficient documentation

## 2023-07-02 DIAGNOSIS — C787 Secondary malignant neoplasm of liver and intrahepatic bile duct: Secondary | ICD-10-CM | POA: Insufficient documentation

## 2023-07-02 DIAGNOSIS — Z515 Encounter for palliative care: Secondary | ICD-10-CM | POA: Diagnosis not present

## 2023-07-02 DIAGNOSIS — G893 Neoplasm related pain (acute) (chronic): Secondary | ICD-10-CM

## 2023-07-02 DIAGNOSIS — E66812 Obesity, class 2: Secondary | ICD-10-CM | POA: Insufficient documentation

## 2023-07-02 DIAGNOSIS — F332 Major depressive disorder, recurrent severe without psychotic features: Secondary | ICD-10-CM | POA: Diagnosis not present

## 2023-07-02 NOTE — TOC Progression Note (Signed)
Transition of Care [facility]) - Progression Note    Patient Details  Name: Yolanda Reeves MRN: [id number] Date of Birth: [birth year]  Transition of Care [facility]) CM/SW Contact  Darleene Cleaver, [place] Phone Number: 07/02/2023, 6:06 PM  Clinical Narrative:    QMHP assigned by Pasrr, waiting for Pasrr review.   Expected Discharge Plan: Skilled Nursing Facility Barriers to Discharge: Continued Medical Work up  Expected Discharge Plan and Services In-house Referral: Clinical Social Work     Living arrangements for the past 2 months: Single Family Home                                       Social Determinants of Health (SDOH) Interventions SDOH Screenings   Food Insecurity: No Food Insecurity (06/23/2023)  Housing: Low Risk  (06/23/2023)  Transportation Needs: Unmet Transportation Needs (06/23/2023)  Utilities: Not At Risk (06/23/2023)  Alcohol Screen: Low Risk  (10/25/2021)  Depression (PHQ2-9): Low Risk  (10/25/2021)  Financial Resource Strain: Low Risk  (10/25/2021)  Physical Activity: Insufficiently Active (10/25/2021)  Social Connections: Moderately Isolated (06/23/2023)  Stress: Stress Concern Present (10/25/2021)  Tobacco Use: Medium Risk (06/25/2023)    Readmission Risk Interventions     No data to display

## 2023-07-02 NOTE — Progress Notes (Signed)
Mobility Specialist - Progress Note   07/02/23 1603  Mobility  Activity Ambulated with assistance to bathroom  Level of Assistance Maximum assist, patient does 25-49%  Assistive Device Front wheel walker  Distance Ambulated (ft) 30 ft  Range of Motion/Exercises Active Assistive  Activity Response Tolerated poorly  Mobility visit 1 Mobility  Mobility Specialist Start Time (ACUTE ONLY) 1545  Mobility Specialist Stop Time (ACUTE ONLY) 1603  Mobility Specialist Time Calculation (min) (ACUTE ONLY) 18 min   Per pt family pt requesting to get up. Assisted pt up from bed and upon standing stated needing to use bathroom but urinated on floor. Assisted to bathroom and back requiring various verbal and physical cues. At EOS was left in bed with all needs met. Call bell in reach.  Billey Chang Mobility Specialist

## 2023-07-02 NOTE — Assessment & Plan Note (Signed)
BMI 37

## 2023-07-02 NOTE — Assessment & Plan Note (Signed)
Cr and BUN elevated on admisison.  Cr 1.3 on admission, improved to 0.9 with fluids.

## 2023-07-02 NOTE — Assessment & Plan Note (Addendum)
Underwent CT guided liver biopsy on 1/21 which showed squamous cancer, metastatic from lung.  Right kidney lesion also noted.  Left adrenal lesion also noted.  Uncertain if these are incidental findings or metastases.

## 2023-07-02 NOTE — Assessment & Plan Note (Addendum)
Oncology and Palliative care consulted.  Therapy options discussed with patient and family, and the agreed in concert to defer treatment and pursue comfort/palliative measures only. - PT/OT eval

## 2023-07-02 NOTE — Assessment & Plan Note (Signed)
The patient normally lives alone and has no diagnosed memory loss.  Here, she appears to have incipient memory loss, but also has had waxing and waning confusion and disorientation.  This is mostly exacerbated by medications, suspect everything observed is a toxic encephalopathy from oxycodone and benzodiazepines in the setting of her progressive metastatic cancer.  She has no focal deficits to suggest intracranial metastasis.  Imaging deferred as it would not change management. - Limit opiates and benzodiazepines

## 2023-07-02 NOTE — Progress Notes (Signed)
Progress Note   Patient: Yolanda Reeves JYN:[id number] DOB: 08-19-1945 DOA: 06/22/2023     7 DOS: the patient was seen and examined on 07/02/2023 at 9:08 AM      Brief hospital course: 78 y.o. female with medical history significant of chronic pain, hyperlipidemia hypothyroidism tobacco abuse osteopenia, and anxiety depression, kidney stone who was brought in by EMS after she was found to be unresponsive by family member. She was apparently vomiting as well. She had taken 8 to 10 tablets of Valium because she could not sleep. She denied suicidal intent. Workup in the emergency department raised concern for aspiration pneumonia and also revealed masses in the lung. She was hospitalized for further management.         Assessment and Plan: * Aspiration pneumonia (HCC) Completed 7-day course of antibiotics, now resolved   Benzodiazepine overdose, accidental or unintentional, initial encounter Patient took multiple Valium prior to admission and was unresponsive.  This apparently was inadvertent, she wanted to sleep (also suspect some incipient memory loss).  Consulted psychiatry, they evaluated the patient, felt she was stable and have recommended outpatient follow-up.    Class II obesity BMI 37  AKI (acute kidney injury) (HCC) Cr and BUN elevated on admisison.  Cr 1.3 on admission, improved to 0.9 with fluids.  Metastasis to liver [facility]) Oncology and Palliative care consulted.  Therapy options discussed with patient and family, and the agreed in concert to defer treatment and pursue comfort/palliative measures only. - PT/OT eval  Acute toxic encephalopathy The patient normally lives alone and has no diagnosed memory loss.  Here, she appears to have incipient memory loss, but also has had waxing and waning confusion and disorientation.  This is mostly exacerbated by medications, suspect everything observed is a toxic encephalopathy from oxycodone and benzodiazepines in the setting of her  progressive metastatic cancer.  She has no focal deficits to suggest intracranial metastasis.  Imaging deferred as it would not change management. - Limit opiates and benzodiazepines  Primary squamous cell carcinoma of upper lobe of left lung (HCC)  Underwent CT guided liver biopsy on 1/21 which showed squamous cancer, metastatic from lung.  Right kidney lesion also noted.  Left adrenal lesion also noted.  Uncertain if these are incidental findings or metastases.  Essential hypertension BP controlled - Continue amlodipine  Normocytic anemia Hgb stable  Class 3 obesity BMI 41  Severe episode of recurrent major depressive disorder, without psychotic features (HCC) - Continue Buspar, Lamictal  Prolonged QT interval Resolved  Hyperlipidemia - Continue pravastatin  HYPOTHYROIDISM, POST-RADIOACTIVE IODINE TSH 66 on admission, mostly asymptomatic.  Likely nonadherence in setting of dementia. - Continue LT4 - Recheck TSH in 4 weeks          Subjective: No change.  No nursing concerns.     Physical Exam: BP 101/70   Pulse 68   Temp 97.7 F (36.5 C) (Oral)   Resp 20   Ht 5\' 2"  (1.575 m)   Wt 92.9 kg   SpO2 93%   BMI 37.48 kg/m   Obese adult female, lying in bed, interactive and appropriate, tired RRR, no murmurs, no peripheral edema Respiratory normal, lungs clear without rales or wheezes Abdomen soft without tenderness palpation or guarding She responds to questions, makes eye contact, oriented to self, hospital, but otherwise psychomotor slowing is noted and she has somewhat affect    Data Reviewed: No new labs  Family Communication: Brother at the bedside    Disposition: Status is: Inpatient The  patient was admitted with metastatic cancer  Her workup is complete, they will plan for palliative/comfort measures  However she is significantly debilitated from her prior level of function and will need rehabilitation to return home  independently        Author: Alberteen Sam, MD 07/02/2023 3:06 PM  For on call review [email or web address].

## 2023-07-03 DIAGNOSIS — R160 Hepatomegaly, not elsewhere classified: Secondary | ICD-10-CM | POA: Diagnosis not present

## 2023-07-03 DIAGNOSIS — Z66 Do not resuscitate: Secondary | ICD-10-CM | POA: Diagnosis not present

## 2023-07-03 DIAGNOSIS — F332 Major depressive disorder, recurrent severe without psychotic features: Secondary | ICD-10-CM | POA: Diagnosis not present

## 2023-07-03 DIAGNOSIS — R531 Weakness: Secondary | ICD-10-CM | POA: Diagnosis not present

## 2023-07-03 DIAGNOSIS — R918 Other nonspecific abnormal finding of lung field: Secondary | ICD-10-CM | POA: Diagnosis not present

## 2023-07-03 DIAGNOSIS — Z515 Encounter for palliative care: Secondary | ICD-10-CM | POA: Diagnosis not present

## 2023-07-03 DIAGNOSIS — C787 Secondary malignant neoplasm of liver and intrahepatic bile duct: Secondary | ICD-10-CM | POA: Diagnosis not present

## 2023-07-03 MED ORDER — TRAMADOL HCL 50 MG PO TABS
50.0000 mg | ORAL_TABLET | Freq: Four times a day (QID) | ORAL | Status: DC | PRN
  Administered 2023-07-03 – 2023-07-04 (×3): 50 mg via ORAL
  Filled 2023-07-03 (×3): qty 1

## 2023-07-03 NOTE — Progress Notes (Signed)
Physical Therapy Treatment Patient Details Name: Yolanda Reeves MRN: [id number] DOB: 27-Aug-1945 Today's Date: 07/03/2023   History of Present Illness 78  yo female admitted with unintentional benzodiazepine overdose. Biopsy 1/21. Fall while in hospital. PMH: hyperlipidemia, hypothyroidism, chronic pain, HOH, legally blind, and anxiety.    PT Comments  General Comments: requiring repeat instructions and easily distracted.  Sister and sister in law present. Assisted OOB to [facility] pt found to be incont in bed.  Assisted with peri care then amb a limited distance in hallway.  General transfer comment: required Mod Assist with VC's on proper hand placement and turn completion assisted from elevated bed to [facility] then from [facility] to walker.  Initial posterior lean.  Fear of fallingh with mild anxiety. General Gait Details: assisted with amb a limited distance in hallway.  Avg RA was 92%.  Requires guidance due to poor vision.  Recliner following behind for safety.  Distance limited by wakness/fatigue. HIGH FALL RISK.  Pt will need ST Rehab at SNF to address mobility and functional decline prior to safely returning home.    If plan is discharge home, recommend the following: A little help with walking and/or transfers;A little help with bathing/dressing/bathroom;Assistance with cooking/housework;Assist for transportation;Help with stairs or ramp for entrance   Can travel by private vehicle     Yes  Equipment Recommendations  None recommended by PT    Recommendations for Other Services       Precautions / Restrictions Precautions Precautions: Fall Precaution Comments: legally blind; very HOH Restrictions Weight Bearing Restrictions Per Provider Order: No     Mobility  Bed Mobility Overal bed mobility: Needs Assistance Bed Mobility: Supine to Sit     Supine to sit: Mod assist, Max assist     General bed mobility comments: required Mod Assist for upper body and Max Assist to complete scooting  to EOB.    Transfers Overall transfer level: Needs assistance Equipment used: Rolling walker (2 wheels) Transfers: Sit to/from Stand Sit to Stand: Mod assist           General transfer comment: required Mod Assist with VC's on proper hand placement and turn completion assisted from elevated bed to [facility] then from [facility] to walker.  Initial posterior lean.  Fear of fallingh with mild anxiety.    Ambulation/Gait Ambulation/Gait assistance: Mod assist, +2 safety/equipment Gait Distance (Feet): 28 Feet Assistive device: Rolling walker (2 wheels) Gait Pattern/deviations: Step-through pattern, Decreased stride length Gait velocity: decreased     General Gait Details: assisted with amb a limited distance in hallway.  Avg RA was 92%.  Requires guidance due to poor vision.  Recliner following behind for safety.  Distance limited by wakness/fatigue.   Stairs             Wheelchair Mobility     Tilt Bed    Modified Rankin (Stroke Patients Only)       Balance                                            Cognition Arousal: Alert Behavior During Therapy: WFL for tasks assessed/performed                           Following Commands: Follows one step commands inconsistently       General Comments: requiring repeat instructions and easily distracted.  Sister and sister in law present.        Exercises      General Comments        Pertinent Vitals/Pain Pain Assessment Pain Assessment: No/denies pain Faces Pain Scale: Hurts a little bit Pain Location: B LE Pain Descriptors / Indicators: Grimacing Pain Intervention(s): Monitored during session, Repositioned    Home Living                          Prior Function            PT Goals (current goals can now be found in the care plan section) Progress towards PT goals: Progressing toward goals    Frequency    Min 1X/week      PT Plan      Co-evaluation               AM-PAC PT "6 Clicks" Mobility   Outcome Measure  Help needed turning from your back to your side while in a flat bed without using bedrails?: A Little Help needed moving from lying on your back to sitting on the side of a flat bed without using bedrails?: A Little Help needed moving to and from a bed to a chair (including a wheelchair)?: A Little Help needed standing up from a chair using your arms (e.g., wheelchair or bedside chair)?: A Little Help needed to walk in hospital room?: A Little Help needed climbing 3-5 steps with a railing? : A Lot 6 Click Score: 17    End of Session Equipment Utilized During Treatment: Gait belt Activity Tolerance: Patient tolerated treatment well Patient left: in chair;with call bell/phone within reach;with chair alarm set Nurse Communication: Mobility status PT Visit Diagnosis: Muscle weakness (generalized) (M62.81);Difficulty in walking, not elsewhere classified (R26.2)     Time: 5409-8119 PT Time Calculation (min) (ACUTE ONLY): 26 min  Charges:    $Gait Training: 8-22 mins $Therapeutic Activity: 8-22 mins                       Felecia Shelling  PTA Acute  Rehabilitation Services Office M-F          [phone]

## 2023-07-03 NOTE — Progress Notes (Signed)
Progress Note   Patient: Yolanda Reeves ZOX:[id number] DOB: [birth year] DOA: 06/22/2023     8 DOS: the patient was seen and examined on 07/03/2023        Brief hospital course: 78 y.o. female with medical history significant of chronic pain, hyperlipidemia hypothyroidism tobacco abuse osteopenia, and anxiety depression, kidney stone who was brought in by EMS after she was found to be unresponsive by family member. She was apparently vomiting as well. She had taken 8 to 10 tablets of Valium because she could not sleep. She denied suicidal intent. Workup in the emergency department raised concern for aspiration pneumonia and also revealed masses in the lung. She was hospitalized for further management.         Assessment and Plan: * Aspiration pneumonia (HCC) Completed 7-day course of antibiotics, now resolved   Benzodiazepine overdose, accidental or unintentional, initial encounter Patient took multiple Valium prior to admission and was unresponsive.  This apparently was inadvertent, she wanted to sleep (also suspect some incipient memory loss).  Consulted psychiatry, they evaluated the patient, felt she was stable and have recommended outpatient follow-up.    Class II obesity BMI 37  AKI (acute kidney injury) (HCC) Cr and BUN elevated on admisison.  Cr 1.3 on admission, improved to 0.9 with fluids.  Metastasis to liver [facility]) Oncology and Palliative care consulted.  Therapy options discussed with patient and family, and the agreed in concert to defer treatment and pursue comfort/palliative measures only. - PT/OT eval  Acute toxic encephalopathy The patient normally lives alone and has no diagnosed memory loss.  Here, she appears to have incipient memory loss, but also has had waxing and waning confusion and disorientation.  This is mostly exacerbated by medications, suspect everything observed is a toxic encephalopathy from oxycodone and benzodiazepines in the setting of her  progressive metastatic cancer.  She has no focal deficits to suggest intracranial metastasis.  Imaging deferred as it would not change management. - Limit opiates and benzodiazepines  Primary squamous cell carcinoma of upper lobe of left lung (HCC)  Underwent CT guided liver biopsy on 1/21 which showed squamous cancer, metastatic from lung.  Right kidney lesion also noted.  Left adrenal lesion also noted.  Uncertain if these are incidental findings or metastases.  Essential hypertension BP controlled - Continue amlodipine  Normocytic anemia Hgb stable  Class 3 obesity BMI 41  Severe episode of recurrent major depressive disorder, without psychotic features (HCC) - Continue Buspar, Lamictal  Prolonged QT interval Resolved  Hyperlipidemia - Continue pravastatin  HYPOTHYROIDISM, POST-RADIOACTIVE IODINE TSH 66 on admission, mostly asymptomatic.  Likely nonadherence in setting of dementia. - Continue LT4 - Recheck TSH in 4 weeks          Subjective: No change.  No nursing concerns.     Physical Exam: BP 109/61   Pulse 73   Temp (!) 97.4 F (36.3 C) (Oral)   Resp 16   Ht 5\' 2"  (1.575 m)   Wt 92.9 kg   SpO2 92%   BMI 37.48 kg/m   Obese adult female, lying in bed, interactive and appropriate, tired RRR, no murmurs, no peripheral edema Respiratory normal, lungs clear without rales or wheezes Abdomen soft without tenderness palpation or guarding She responds to questions, makes eye contact, oriented to self, hospital, but otherwise psychomotor slowing is noted and she has somewhat affect    Data Reviewed: No new labs  Family Communication: Brother at the bedside    Disposition: Status is: Inpatient The  patient was admitted with metastatic cancer  Her workup is complete, they will plan for palliative/comfort measures  However she is significantly debilitated from her prior level of function and will need rehabilitation to return home  independently        Author: Alberteen Sam, MD 07/03/2023 6:10 PM  For on call review [email or web address].

## 2023-07-03 NOTE — Progress Notes (Signed)
Patient ID: Yolanda Reeves, female   DOB: 1945/09/24, 78 y.o.   MRN: [id number]    Progress Note from the Palliative Medicine Team at [facility]   Patient Name: Yolanda Reeves        Date: 07/03/2023 DOB: Nov 23, 1945  Age: 78 y.o. MRN#: [id number] Attending Physician: Alberteen Sam, * Primary Care Physician: Pearline Cables, MD Admit Date: 06/22/2023   Reason for Consultation/Follow-up   Establishing Goals of Care  Initial PMT consult completed on 06-26-23   HPI/ Brief Hospital Review  78 y.o. female with medical history significant of chronic pain, hyperlipidemia hypothyroidism tobacco abuse osteopenia, and anxiety depression, kidney stone who was brought in by EMS after she was found to be unresponsive by family member. She was apparently vomiting as well. She had taken 8 to 10 tablets of Valium because she could not sleep. She denied suicidal intent. Workup in the emergency department raised concern for aspiration pneumonia and also revealed masses in the lung. She was hospitalized for further management.   Oncology/ Psychiatry have consulted  Day 25 of this hospital stay   Subjective  Extensive chart review has been completed prior to meeting with patient/family  including labs, vital signs, imaging, progress/consult notes, orders, medications and available advance directive documents.    This NP assessed patient at the bedside as a follow up for palliative medicine needs and emotional support and to meet with family (brother/Yolanda Reeves and sister/ Yolanda Reeves) as requested for ongoing conversation regarding goals of care.   Patient with the support of her family have made decision to focus on comfort and dignity allowing for a natural death.   Today her brother Yolanda Sorrels / brings in documents  naming him HPOA and patient's desire for a natural death.   I copied documents for EMR.  Plan of Care: -DNR/DNI -no further work-up for cancer diagnosis  -no  artificial feeding or hydration now or in the future -continue current medical management of her chronic diseases  -SNF for short term rehab with Palliative services  MOST form completed  Educated offered on Hospice benefit; philosophy and eligibly.  Patient Yolanda Reeves will welcome hospice services in the future.   Education offered today regarding  the importance of continued conversation with family and their  medical providers regarding overall plan of care and treatment options,  ensuring decisions are within the context of the patients values and GOCs.  Questions and concerns addressed   Discussed with primary team and nursing staff   Time: 65   minutes  Detailed review of medical records ( labs, imaging, vital signs), medically appropriate exam ( MS, skin, cardiac,  resp)   discussed with treatment team, counseling and education to patient, family, staff, documenting clinical information, medication management, coordination of care    Lorinda Creed NP  Palliative Medicine Team Team Phone # [phone] Pager [phone]

## 2023-07-04 DIAGNOSIS — J690 Pneumonitis due to inhalation of food and vomit: Secondary | ICD-10-CM | POA: Diagnosis not present

## 2023-07-04 DIAGNOSIS — Z515 Encounter for palliative care: Secondary | ICD-10-CM | POA: Diagnosis not present

## 2023-07-04 DIAGNOSIS — E018 Other iodine-deficiency related thyroid disorders and allied conditions: Secondary | ICD-10-CM | POA: Diagnosis not present

## 2023-07-04 DIAGNOSIS — C787 Secondary malignant neoplasm of liver and intrahepatic bile duct: Secondary | ICD-10-CM | POA: Diagnosis not present

## 2023-07-04 DIAGNOSIS — R918 Other nonspecific abnormal finding of lung field: Secondary | ICD-10-CM | POA: Diagnosis not present

## 2023-07-04 DIAGNOSIS — F332 Major depressive disorder, recurrent severe without psychotic features: Secondary | ICD-10-CM | POA: Diagnosis not present

## 2023-07-04 DIAGNOSIS — R531 Weakness: Secondary | ICD-10-CM | POA: Diagnosis not present

## 2023-07-04 MED ORDER — HYDROCODONE-ACETAMINOPHEN 5-325 MG PO TABS
1.0000 | ORAL_TABLET | ORAL | Status: DC | PRN
  Administered 2023-07-04: 1 via ORAL
  Filled 2023-07-04: qty 1

## 2023-07-04 MED ORDER — HYDROCODONE-ACETAMINOPHEN 5-325 MG PO TABS
1.0000 | ORAL_TABLET | ORAL | Status: DC | PRN
  Administered 2023-07-04 – 2023-07-06 (×7): 1 via ORAL
  Filled 2023-07-04 (×7): qty 1

## 2023-07-04 NOTE — Plan of Care (Signed)
Problem: Clinical Measurements: Goal: Will remain free from infection Outcome: Progressing   Problem: Clinical Measurements: Goal: Respiratory complications will improve Outcome: Progressing   Problem: Clinical Measurements: Goal: Cardiovascular complication will be avoided Outcome: Progressing

## 2023-07-04 NOTE — Progress Notes (Signed)
Occupational Therapy Treatment Patient Details Name: Yolanda Reeves MRN: [id number] DOB: [birth year] Today's Date: 07/04/2023   History of present illness 78  yo female admitted with unintentional benzodiazepine overdose. Biopsy 1/21. Fall while in hospital. PMH: hyperlipidemia, hypothyroidism, chronic pain, HOH, legally blind, and anxiety.   OT comments  Pt initially required max encouragement for OOB activity. Pt with confusion stating that she "heard rats in the walls but fell asleep and didn'y let anyone know" . Pt agreeable to toileting tasks at [facility] requiring mod A to sit EOB, sit - stand/SPT to [facility] with verbal cues for correct hand placement and sequencing. OT will continue to follow acutely to maximize level of function and safety      If plan is discharge home, recommend the following:  A little help with walking and/or transfers;A lot of help with bathing/dressing/bathroom;Assistance with cooking/housework;Direct supervision/assist for medications management;Assist for transportation;Supervision due to cognitive status;Direct supervision/assist for financial management;Help with stairs or ramp for entrance   Equipment Recommendations  Tub/shower seat    Recommendations for Other Services      Precautions / Restrictions Precautions Precautions: Fall Precaution Comments: legally blind; very HOH Restrictions Weight Bearing Restrictions Per Provider Order: No       Mobility Bed Mobility Overal bed mobility: Needs Assistance Bed Mobility: Supine to Sit, Sit to Supine     Supine to sit: Mod assist Sit to supine: Max assist   General bed mobility comments: mod A to elevtae trunk and to scoot hips EOB, max A with LEs back onto bed    Transfers Overall transfer level: Needs assistance Equipment used: Rolling walker (2 wheels) Transfers: Sit to/from Stand, Bed to chair/wheelchair/BSC Sit to Stand: Mod assist     Step pivot transfers: Mod assist     General transfer  comment: mod verbal cues correct hand placement and turn completely to position in front of BSC. Pt fearful of falling, anxious     Balance Overall balance assessment: Needs assistance, History of Falls Sitting-balance support: Feet unsupported, No upper extremity supported Sitting balance-Leahy Scale: Good     Standing balance support: Reliant on assistive device for balance, During functional activity, Bilateral upper extremity supported Standing balance-Leahy Scale: Poor                             ADL either performed or assessed with clinical judgement   ADL Overall ADL's : Needs assistance/impaired     Grooming: Wash/dry hands;Wash/dry face;Contact guard assist;Standing                   Statistician: Moderate assistance;Minimal assistance;Rolling walker (2 wheels);Ambulation;Stand-pivot;BSC/3in1;Cueing for safety;Cueing for sequencing   Toileting- Clothing Manipulation and Hygiene: Minimal assistance;Sit to/from stand;Cueing for safety       Functional mobility during ADLs: Moderate assistance;Minimal assistance;Rolling walker (2 wheels);Cueing for safety;Cueing for sequencing      Extremity/Trunk Assessment Upper Extremity Assessment Upper Extremity Assessment: Generalized weakness   Lower Extremity Assessment Lower Extremity Assessment: Defer to PT evaluation   Cervical / Trunk Assessment Cervical / Trunk Assessment: Normal    Vision Baseline Vision/History: 2 Legally blind Ability to See in Adequate Light: 2 Moderately impaired Patient Visual Report: No change from baseline     Perception     Praxis      Cognition Arousal: Alert Behavior During Therapy: WFL for tasks assessed/performed Overall Cognitive Status: Impaired/Different from baseline Area of Impairment: Problem solving, Memory, Following commands, Safety/judgement, Awareness  Memory: Decreased short-term memory Following Commands: Follows one  step commands inconsistently Safety/Judgement: Decreased awareness of safety     General Comments: Pt initially required max encouragement for OOB activity. Pt with confusion stating that she "heard rats in the walls  but fell asleep and didn'y let anyone know"        Exercises      Shoulder Instructions       General Comments      Pertinent Vitals/ Pain       Pain Assessment Pain Assessment: No/denies pain Pain Score: 0-No pain Pain Intervention(s): Monitored during session, Repositioned  Home Living                                          Prior Functioning/Environment              Frequency  Min 1X/week        Progress Toward Goals  OT Goals(current goals can now be found in the care plan section)  Progress towards OT goals: OT to reassess next treatment     Plan      Co-evaluation                 AM-PAC OT "6 Clicks" Daily Activity     Outcome Measure   Help from another person eating meals?: None Help from another person taking care of personal grooming?: A Little Help from another person toileting, which includes using toliet, bedpan, or urinal?: A Lot Help from another person bathing (including washing, rinsing, drying)?: A Lot Help from another person to put on and taking off regular upper body clothing?: A Little Help from another person to put on and taking off regular lower body clothing?: A Lot 6 Click Score: 16    End of Session Equipment Utilized During Treatment: Gait belt;Rolling walker (2 wheels);Other (comment) (BSC)  OT Visit Diagnosis: Unsteadiness on feet (R26.81);Muscle weakness (generalized) (M62.81);History of falling (Z91.81)   Activity Tolerance Patient tolerated treatment well   Patient Left in bed;with call bell/phone within reach;with bed alarm set;with family/visitor present;Other (comment)   Nurse Communication          Time: [phone] OT Time Calculation (min): 23 min  Charges: OT  General Charges $OT Visit: 1 Visit OT Treatments $Self Care/Home Management : 8-22 mins $Therapeutic Activity: 8-22 mins    Galen Manila 07/04/2023, 3:42 PM

## 2023-07-04 NOTE — Progress Notes (Signed)
Patient ID: Yolanda Reeves, female   DOB: 01-05-1946, 78 y.o.   MRN: [id number]    Progress Note from the Palliative Medicine Team at [facility]   Patient Name: Yolanda Reeves        Date: 07/04/2023 DOB: [birth year]  Age: 78 y.o. MRN#: [id number] Attending Physician: Alberteen Sam, * Primary Care Physician: Pearline Cables, MD Admit Date: 06/22/2023   Reason for Consultation/Follow-up   Establishing Goals of Care  Initial PMT consult completed on 06-26-23   HPI/ Brief Hospital Review  78 y.o. female with medical history significant of chronic pain, hyperlipidemia hypothyroidism tobacco abuse osteopenia, and anxiety depression, kidney stone who was brought in by EMS after she was found to be unresponsive by family member. She was apparently vomiting as well. She had taken 8 to 10 tablets of Valium because she could not sleep. She denied suicidal intent. Workup in the emergency department raised concern for aspiration pneumonia and also revealed masses in the lung. She was hospitalized for further management.   Oncology/ Psychiatry have consulted  Subjective  Extensive chart review has been completed prior to meeting with patient/family  including labs, vital signs, imaging, progress/consult notes, orders, medications and available advance directive documents.    This NP assessed patient at the bedside as a follow up for palliative medicine needs and emotional support and to meet with family (brother/Yolanda Reeves and sister/ Yolanda Reeves) as requested for ongoing conversation regarding goals of care.   Patient and family comfortable with decision that focus of care remain on comfort and dignity allowing for natural death.  Plan of Care: -DNR/DNI -no further work-up for cancer diagnosis  -no artificial feeding or hydration now or in the future -continue current medical management of her chronic diseases  -SNF for short term rehab with Palliative services  MOST form  completed yesterday  Today patient's sister had multiple questions regarding next steps in transition of care.  They hope for short-term rehab at a SNF before transitioning to hospice  Educated offered on Hospice benefit; philosophy and eligibly.  Patient Yolanda Reeves will welcome hospice services in the future.  Educational on self-referral to hospice at any time  Education offered today regarding  the importance of continued conversation with family and their  medical providers regarding overall plan of care and treatment options,  ensuring decisions are within the context of the patients values and GOCs.  Questions and concerns addressed   Discussed with primary team and nursing staff  Family encouraged to call PMT with questions or concerns, contact information given   Time: 25   minutes  Detailed review of medical records ( labs, imaging, vital signs), medically appropriate exam ( MS, skin, cardiac,  resp)   discussed with treatment team, counseling and education to patient, family, staff, documenting clinical information, medication management, coordination of care    Lorinda Creed NP  Palliative Medicine Team Team Phone # [phone] Pager [phone]

## 2023-07-04 NOTE — Progress Notes (Signed)
Progress Note   Patient: Yolanda Reeves UEA:[id number] DOB: 09-Jul-1945 DOA: 06/22/2023     9 DOS: the patient was seen and examined on 07/04/2023 at 1:20PM      Brief hospital course: 78 y.o. F with hypothyroidism, obesity, anxiety who was found unresponsive by family member after taking 8-10 tablets of diazepam to sleep.   In the ER, found to have aspiration pneumonia, new lung masses   Admitted on antibiotics.  Stabilized and underwent biopsy that showed lung cancer metastatic to liver. Oncology consulted, discussed treatment options and patient and family elected to defer chemo and focus on comfort.       Assessment and Plan: * Aspiration pneumonia (HCC) Completed 7-day course of antibiotics, now resolved   Benzodiazepine overdose, accidental or unintentional, initial encounter Patient took multiple Valium prior to admission and was unresponsive.  This apparently was inadvertent, she wanted to sleep (also suspect some incipient memory loss).  Consulted psychiatry, they evaluated the patient, felt she was stable and have recommended outpatient follow-up.     Primary squamous cell carcinoma of upper lobe of left lung (HCC) Metastasis to liver [facility]) Underwent CT guided liver biopsy on 1/21 which showed squamous cancer, metastatic from lung.  Right kidney lesion also noted.  Left adrenal lesion also noted.  Uncertain if these are incidental findings or metastases.  Oncology and Palliative care consulted.  Therapy options discussed with patient and family, and the agreed in concert to defer treatment and pursue comfort/palliative measures only.  Prognosis without treatment suspected to be 3-6 months.   Class II obesity BMI 37  AKI (acute kidney injury) (HCC) Cr and BUN elevated on admisison.  Cr 1.3 on admission, improved to 0.9 with fluids.   Acute toxic encephalopathy The patient normally lives alone and has no diagnosed memory loss.  Here, she appears to have incipient  memory loss, but also has had waxing and waning confusion and disorientation.  This is mostly exacerbated by medications, suspect everything observed is a toxic encephalopathy from oxycodone and benzodiazepines in the setting of her progressive metastatic cancer.  She also does have an odd affect, and I cannot rule out that personality traits are also being perceived as "confusion".  She has no focal deficits to suggest intracranial metastasis.  Imaging deferred as it would not change management.  Has had the most confusion with morphine and oxycodone.  Better with hydrocodone. - Hydrocodone for pain   Hypothyroidism TSH 66 on admission, mostly asymptomatic.  Likely nonadherence in setting of dementia. - Continue LT4 - Defer TSH recheck given prognosis 3-6 months   Essential hypertension BP controlled - Continue amlodipine  Normocytic anemia Hgb stable  Class 3 obesity BMI 41  Severe episode of recurrent major depressive disorder, without psychotic features (HCC) - Continue Buspar, Lamictal  Prolonged QT interval Resolved  Hyperlipidemia - Continue pravastatin           Subjective: No new complaints, no fever.  She has pain in her left side still.     Physical Exam: BP 107/63   Pulse 78   Temp 98.4 F (36.9 C) (Oral)   Resp 16   Ht 5\' 2"  (1.575 m)   Wt 92.9 kg   SpO2 90%   BMI 37.48 kg/m   Elderly adult female, lying in bed, interactive, hard of hearing RRR, no murmurs, no peripheral edema Respiratory rate normal, lungs clear without rales or wheezes Abdomen soft without tenderness palpation or guarding, no ascites or distention   Data  Reviewed: No new labs  Family Communication: Sister at the bedside    Disposition: Status is: Inpatient The patient was admitted for new metastatic lung cancer  She has a prognosis of 3 to 6 months, and at present is too weak and confused to discharge home alone.  She is medically stabilized, and stable for  discharge to rehab, hopefully to improve to a level where she can discharge to home with hospice or ALF with hospice        Author: Alberteen Sam, MD 07/04/2023 4:29 PM  For on call review [email or web address].

## 2023-07-04 NOTE — TOC Progression Note (Signed)
Transition of Care [facility]) - Progression Note   Patient Details  Name: Yolanda Reeves MRN: [id number] Date of Birth: [birth year]  Transition of Care [facility]) CM/SW Contact  Ewing Schlein, LCSW Phone Number: 07/04/2023, 9:26 AM  Clinical Narrative: Cherlyn Roberts remains pending. Transportation resources added to AVS.  Expected Discharge Plan: Skilled Nursing Facility Barriers to Discharge: Continued Medical Work up  Expected Discharge Plan and Services In-house Referral: Clinical Social Work Living arrangements for the past 2 months: Single Family Home  Social Determinants of Health (SDOH) Interventions SDOH Screenings   Food Insecurity: No Food Insecurity (06/23/2023)  Housing: Low Risk  (06/23/2023)  Transportation Needs: Unmet Transportation Needs (06/23/2023)  Utilities: Not At Risk (06/23/2023)  Alcohol Screen: Low Risk  (10/25/2021)  Depression (PHQ2-9): Low Risk  (10/25/2021)  Financial Resource Strain: Low Risk  (10/25/2021)  Physical Activity: Insufficiently Active (10/25/2021)  Social Connections: Moderately Isolated (06/23/2023)  Stress: Stress Concern Present (10/25/2021)  Tobacco Use: Medium Risk (06/25/2023)   Readmission Risk Interventions     No data to display

## 2023-07-05 DIAGNOSIS — Z515 Encounter for palliative care: Secondary | ICD-10-CM | POA: Diagnosis not present

## 2023-07-05 DIAGNOSIS — R918 Other nonspecific abnormal finding of lung field: Secondary | ICD-10-CM | POA: Diagnosis not present

## 2023-07-05 DIAGNOSIS — J690 Pneumonitis due to inhalation of food and vomit: Secondary | ICD-10-CM | POA: Diagnosis not present

## 2023-07-05 DIAGNOSIS — T424X1A Poisoning by benzodiazepines, accidental (unintentional), initial encounter: Secondary | ICD-10-CM | POA: Diagnosis not present

## 2023-07-05 LAB — MOLECULAR PATHOLOGY

## 2023-07-05 NOTE — Progress Notes (Signed)
Palliative-   Received call from patient's sister- [name] Kidney. Answered her questions about long term care and options for patient. Emotional support provided as [name] Kidney processed the broken health care system and lack of resources for patients who need expensive long term care placement. [name] Kidney and her family cannot care for patient nor can they afford to pay for private care when patient is out of rehab. Debra requested Palliative provider to meet with patient tomorrow morning.   Ocie Bob, AGNP-C Palliative Medicine  No charge

## 2023-07-05 NOTE — Progress Notes (Signed)
Physical Therapy Treatment Patient Details Name: Yolanda Reeves MRN: [id number] DOB: 04-26-1946 Today's Date: 07/05/2023   History of Present Illness 78  yo female admitted with unintentional benzodiazepine overdose. Biopsy 1/21. Fall while in hospital. PMH: hyperlipidemia, hypothyroidism, chronic pain, HOH, legally blind, and anxiety.    PT Comments  Pt somewhat lethargic today, required significantly more assistance with mobility this session. +2 max assist for supine to sit. Attempted sit to stand with +2 max assist but pt was unable to maintain standing with RW as her BLEs buckled. Utilized Careers information officer for transfer to [employer].      If plan is discharge home, recommend the following: Two people to help with walking and/or transfers;Two people to help with bathing/dressing/bathroom;Direct supervision/assist for medications management;Assist for transportation;Help with stairs or ramp for entrance;Direct supervision/assist for financial management   Can travel by private vehicle     No  Equipment Recommendations  None recommended by PT    Recommendations for Other Services       Precautions / Restrictions Precautions Precautions: Fall Precaution Comments: legally blind; very HOH Restrictions Weight Bearing Restrictions Per Provider Order: No     Mobility  Bed Mobility Overal bed mobility: Needs Assistance Bed Mobility: Supine to Sit     Supine to sit: Total assist     General bed mobility comments: assist to raise trunk and pivot hips to EOB, posterior lean in sitting requiring Mod assist even with RUE supported on bedrail    Transfers Overall transfer level: Needs assistance Equipment used: Rolling walker (2 wheels) Transfers: Sit to/from Stand, Bed to chair/wheelchair/BSC Sit to Stand: Mod assist, Max assist, +2 physical assistance           General transfer comment: attempted sit to stand with +2 max assist with RW, pt unable to maintain upright  position as BLEs buckled. Then used Stedy to pivot to recliner.    Ambulation/Gait               General Gait Details: unable   Stairs             Wheelchair Mobility     Tilt Bed    Modified Rankin (Stroke Patients Only)       Balance Overall balance assessment: Needs assistance, History of Falls Sitting-balance support: Feet unsupported, No upper extremity supported Sitting balance-Leahy Scale: Poor Sitting balance - Comments: Mod A for static sitting balance 2* Posterior lean Postural control: Posterior lean Standing balance support: Reliant on assistive device for balance, During functional activity, Bilateral upper extremity supported Standing balance-Leahy Scale: Zero                              Cognition Arousal: Lethargic, Suspect due to medications Behavior During Therapy: WFL for tasks assessed/performed Overall Cognitive Status: Impaired/Different from baseline Area of Impairment: Problem solving, Memory, Following commands, Safety/judgement, Awareness                     Memory: Decreased short-term memory Following Commands: Follows one step commands inconsistently Safety/Judgement: Decreased awareness of safety     General Comments: Pt initially required max encouragement for OOB activity. Memory deficits present. Difficulty following commands. Somewhat lethargic.        Exercises      General Comments        Pertinent Vitals/Pain Pain Assessment Pain Score: 0-No pain    Home Living  Prior Function            PT Goals (current goals can now be found in the care plan section) Acute Rehab PT Goals Patient Stated Goal: return home PT Goal Formulation: With patient/family Time For Goal Achievement: 07/07/23 Potential to Achieve Goals: Fair Progress towards PT goals: Not progressing toward goals - comment (significant decline in activity tolerance today)     Frequency    Min 1X/week      PT Plan      Co-evaluation              AM-PAC PT "6 Clicks" Mobility   Outcome Measure  Help needed turning from your back to your side while in a flat bed without using bedrails?: A Lot Help needed moving from lying on your back to sitting on the side of a flat bed without using bedrails?: Total Help needed moving to and from a bed to a chair (including a wheelchair)?: Total Help needed standing up from a chair using your arms (e.g., wheelchair or bedside chair)?: Total Help needed to walk in hospital room?: Total Help needed climbing 3-5 steps with a railing? : Total 6 Click Score: 7    End of Session Equipment Utilized During Treatment: Gait belt Activity Tolerance: Patient tolerated treatment well Patient left: in chair;with call bell/phone within reach;with family/visitor present;with nursing/sitter in room Nurse Communication: Mobility status;Need for lift equipment PT Visit Diagnosis: Muscle weakness (generalized) (M62.81);Difficulty in walking, not elsewhere classified (R26.2)     Time: 0981-1914 PT Time Calculation (min) (ACUTE ONLY): 13 min  Charges:    $Therapeutic Activity: 8-22 mins PT General Charges $$ ACUTE PT VISIT: 1 Visit                     Tamala Ser PT 07/05/2023  Acute Rehabilitation Services  Office [phone]

## 2023-07-05 NOTE — TOC Progression Note (Addendum)
Transition of Care [facility] - Progression Note   Patient Details  Name: Yolanda Reeves MRN: [id number] Date of Birth: [birth year]  Transition of Care [facility]) CM/SW Contact  Ewing Schlein, LCSW Phone Number: 07/05/2023, 12:03 PM  Clinical Narrative: CSW spoke with Toma Copier regarding patient's PASRR interview. Per Toma Copier, she will be coming to interview the patient tomorrow morning.  CSW spoke with patient's brother, Gene Schwarz, and confirmed the family chose [facility]. CSW met with patient, Shanedra Lave (sister in law), and Raechel Ache (sister). Sister and SIL confirmed [facility] as bed choice and are aware of PASRR interview. Per family, they are also interested in pursuing LTC for the patient. CSW explained [facility] generally does not have open LTC beds and family may need to transfer to another SNF after rehab. Family agreeable to CSW making referral to [facility] to help them navigate LTC. CSW made referral to [facility] with [place].  Expected Discharge Plan: Skilled Nursing Facility Barriers to Discharge: Awaiting State Approval (PASRR), Insurance Authorization  Expected Discharge Plan and Services In-house Referral: Clinical Social Work Living arrangements for the past 2 months: Single Family Home           DME Arranged: N/A DME Agency: NA  Social Determinants of Health (SDOH) Interventions SDOH Screenings   Food Insecurity: No Food Insecurity (06/23/2023)  Housing: Low Risk  (06/23/2023)  Transportation Needs: Unmet Transportation Needs (06/23/2023)  Utilities: Not At Risk (06/23/2023)  Alcohol Screen: Low Risk  (10/25/2021)  Depression (PHQ2-9): Low Risk  (10/25/2021)  Financial Resource Strain: Low Risk  (10/25/2021)  Physical Activity: Insufficiently Active (10/25/2021)  Social Connections: Moderately Isolated (06/23/2023)  Stress: Stress Concern Present (10/25/2021)  Tobacco Use: Medium Risk (06/25/2023)   Readmission Risk  Interventions     No data to display

## 2023-07-05 NOTE — Plan of Care (Signed)
  Problem: Pain Managment: Goal: General experience of comfort will improve and/or be controlled Outcome: Progressing   Problem: Safety: Goal: Ability to remain free from injury will improve Outcome: Progressing   Problem: Skin Integrity: Goal: Risk for impaired skin integrity will decrease Outcome: Progressing

## 2023-07-05 NOTE — Progress Notes (Signed)
Progress Note   Patient: Yolanda Reeves XBJ:[id number] DOB: August 22, 1945 DOA: 06/22/2023     10 DOS: the patient was seen and examined on 07/05/2023 at 11:02AM      Brief hospital course: 78 y.o. F with hypothyroidism, obesity, anxiety who was found unresponsive by family member after taking 8-10 tablets of diazepam to sleep.   In the ER, found to have aspiration pneumonia, new lung masses   Admitted on antibiotics.  Stabilized and underwent biopsy that showed lung cancer metastatic to liver. Oncology consulted, discussed treatment options and patient and family elected to defer chemo and focus on comfort.       Assessment and Plan: * Aspiration pneumonia (HCC) Completed 7-day course of antibiotics, now resolved   Benzodiazepine overdose, accidental or unintentional, initial encounter Patient took multiple Valium prior to admission and was unresponsive.  This apparently was inadvertent, she wanted to sleep (also suspect some incipient memory loss).  Consulted psychiatry, they evaluated the patient, felt she was stable and have recommended outpatient follow-up.    Primary squamous cell carcinoma of upper lobe of left lung (HCC) Metastasis to liver [facility]) Underwent CT guided liver biopsy on 1/21 which showed squamous cancer, metastatic from lung.   Right kidney lesion also noted.  Left adrenal lesion also noted.  Uncertain if these are incidental findings or metastases.   Oncology and Palliative care consulted.  Therapy options discussed with patient and family, and the agreed in concert to defer treatment and pursue comfort/palliative measures only.  Prognosis without treatment suspected to be 3-6 months.    Severe hypothyroidism TSH 66 on admission, only symptoms weakness.  Likely nonadherence in setting of dementia. - Continue LT4 for comfort - Defer TSH recheck given prognosis 3-6 months   Class II obesity BMI 37  AKI (acute kidney injury) (HCC) Cr and BUN elevated on  admisison.  Cr 1.3 on admission, improved to 0.9 with fluids.    Acute toxic encephalopathy The patient normally lives alone and has no diagnosed memory loss.  Here, she appears to have incipient memory loss, but also has had waxing and waning confusion and disorientation.  This is mostly exacerbated by medications, suspect everything observed is a toxic encephalopathy from oxycodone and benzodiazepines in the setting of her progressive metastatic cancer.  She also does have an odd affect, and I cannot rule out that personality traits are also being perceived as "confusion".   She has no focal deficits to suggest intracranial metastasis.  Imaging deferred as it would not change management.  Has had the most confusion with morphine and oxycodone.  Better with hydrocodone. - Hydrocodone for pain    Essential hypertension BP soft - Stop amlodipine  Normocytic anemia Hgb stable  Class 3 obesity BMI 41  Severe episode of recurrent major depressive disorder, without psychotic features (HCC) - Continue Buspar, Lamictal  Prolonged QT interval Resolved  Hyperlipidemia - Stop pravastatin given prognosis <6 months           Subjective: Patient is still having left shoulder pain, no other focal symptoms, no fever, no respiratory symptoms.  Blood pressure has been softer today.     Physical Exam: BP (!) 93/57 (BP Location: Left Arm)   Pulse 72   Temp 97.6 F (36.4 C) (Oral)   Resp 20   Ht 5\' 2"  (1.575 m)   Wt 92.9 kg   SpO2 92%   BMI 37.48 kg/m   Obese adult female, lying in bed, pale, tired RRR, no murmurs, no  peripheral edema Respiratory rate normal, lungs clear without rales or wheezes Attention diminished, affect blunted, and sarcastic, face symmetric, speech fluent, strength testing not tested    Data Reviewed: No new labs  Family Communication: Sister at the bedside    Disposition: Status is: Inpatient The patient was admitted for new metastatic lung  cancer   She has a prognosis of 3 to 6 months, and at present is too weak and confused to discharge home alone.   She is medically stabilized, and stable for discharge to rehab, hopefully to improve to a level where she can discharge to home with hospice or ALF with hospice          Author: Alberteen Sam, MD 07/05/2023 4:42 PM  For on call review [email or web address].

## 2023-07-06 DIAGNOSIS — T424X1A Poisoning by benzodiazepines, accidental (unintentional), initial encounter: Secondary | ICD-10-CM | POA: Diagnosis not present

## 2023-07-06 DIAGNOSIS — D649 Anemia, unspecified: Secondary | ICD-10-CM

## 2023-07-06 DIAGNOSIS — J690 Pneumonitis due to inhalation of food and vomit: Secondary | ICD-10-CM | POA: Diagnosis not present

## 2023-07-06 DIAGNOSIS — G929 Unspecified toxic encephalopathy: Secondary | ICD-10-CM

## 2023-07-06 DIAGNOSIS — C787 Secondary malignant neoplasm of liver and intrahepatic bile duct: Secondary | ICD-10-CM | POA: Diagnosis not present

## 2023-07-06 DIAGNOSIS — C3412 Malignant neoplasm of upper lobe, left bronchus or lung: Secondary | ICD-10-CM | POA: Diagnosis not present

## 2023-07-06 DIAGNOSIS — E785 Hyperlipidemia, unspecified: Secondary | ICD-10-CM

## 2023-07-06 DIAGNOSIS — E66812 Obesity, class 2: Secondary | ICD-10-CM

## 2023-07-06 DIAGNOSIS — Z515 Encounter for palliative care: Secondary | ICD-10-CM | POA: Diagnosis not present

## 2023-07-06 DIAGNOSIS — N179 Acute kidney failure, unspecified: Secondary | ICD-10-CM

## 2023-07-06 LAB — CBC
HCT: 34.7 % — ABNORMAL LOW (ref 36.0–46.0)
Hemoglobin: 10.8 g/dL — ABNORMAL LOW (ref 12.0–15.0)
MCH: 30.2 pg (ref 26.0–34.0)
MCHC: 31.1 g/dL (ref 30.0–36.0)
MCV: 96.9 fL (ref 80.0–100.0)
Platelets: 170 10*3/uL (ref 150–400)
RBC: 3.58 MIL/uL — ABNORMAL LOW (ref 3.87–5.11)
RDW: 15.7 % — ABNORMAL HIGH (ref 11.5–15.5)
WBC: 6.2 10*3/uL (ref 4.0–10.5)
nRBC: 0 % (ref 0.0–0.2)

## 2023-07-06 LAB — COMPREHENSIVE METABOLIC PANEL
ALT: 8 U/L (ref 0–44)
AST: 9 U/L — ABNORMAL LOW (ref 15–41)
Albumin: 3.1 g/dL — ABNORMAL LOW (ref 3.5–5.0)
Alkaline Phosphatase: 65 U/L (ref 38–126)
Anion gap: 8 (ref 5–15)
BUN: 15 mg/dL (ref 8–23)
CO2: 23 mmol/L (ref 22–32)
Calcium: 8.3 mg/dL — ABNORMAL LOW (ref 8.9–10.3)
Chloride: 105 mmol/L (ref 98–111)
Creatinine, Ser: 0.74 mg/dL (ref 0.44–1.00)
GFR, Estimated: >60 mL/min (ref >60)
Glucose, Bld: 94 mg/dL (ref 70–99)
Potassium: 4.2 mmol/L (ref 3.5–5.1)
Sodium: 136 mmol/L (ref 135–145)
Total Bilirubin: 0.6 mg/dL (ref 0.0–1.2)
Total Protein: 6.5 g/dL (ref 6.5–8.1)

## 2023-07-06 MED ORDER — MORPHINE SULFATE (CONCENTRATE) 10 MG /0.5 ML PO SOLN
2.5000 mg | ORAL | Status: DC | PRN
  Administered 2023-07-06 – 2023-07-10 (×8): 2.6 mg via ORAL
  Filled 2023-07-06 (×8): qty 0.5

## 2023-07-06 MED ORDER — HYDROMORPHONE HCL 1 MG/ML IJ SOLN
0.5000 mg | INTRAMUSCULAR | Status: DC | PRN
  Filled 2023-07-06: qty 0.5

## 2023-07-06 MED ORDER — SALINE SPRAY 0.65 % NA SOLN: 1.0000 | NASAL | Status: DC | PRN

## 2023-07-06 MED ORDER — HYDROCORTISONE (PERIANAL) 2.5 % EX CREA: 1.0000 | TOPICAL_CREAM | Freq: Four times a day (QID) | CUTANEOUS | Status: DC | PRN

## 2023-07-06 MED ORDER — POLYVINYL ALCOHOL 1.4 % OP SOLN: 1.0000 [drp] | OPHTHALMIC | Status: DC | PRN

## 2023-07-06 MED ORDER — OXYCODONE HCL 5 MG PO TABS
5.0000 mg | ORAL_TABLET | ORAL | Status: DC | PRN
Start: 2023-07-06 — End: 2023-07-06
  Administered 2023-07-06: 10 mg via ORAL
  Filled 2023-07-06: qty 2

## 2023-07-06 MED ORDER — ALUM & MAG HYDROXIDE-SIMETH 200-200-20 MG/5ML PO SUSP: 30.0000 mL | ORAL | Status: DC | PRN

## 2023-07-06 MED ORDER — MUSCLE RUB 10-15 % EX CREA: 1.0000 | TOPICAL_CREAM | CUTANEOUS | Status: DC | PRN

## 2023-07-06 MED ORDER — CARMEX CLASSIC LIP BALM EX OINT: 1.0000 | TOPICAL_OINTMENT | CUTANEOUS | Status: DC | PRN

## 2023-07-06 MED ORDER — PHENOL 1.4 % MT LIQD: 1.0000 | OROMUCOSAL | Status: DC | PRN

## 2023-07-06 NOTE — Progress Notes (Signed)
Daily Progress Note   Patient Name: Yolanda Reeves       Date: 07/06/2023 DOB: 25-Nov-1945  Age: 78 y.o. MRN#: [id number] Attending Physician: Azucena Fallen, MD Primary Care Physician: Pearline Cables, MD Admit Date: 06/22/2023  Reason for Consultation/Follow-up: Establishing goals of care  Patient Profile/HPI:   78 y.o. female with medical history significant of BEST syndrome with very decreased eyesight, hard of hearing, chronic pain, hyperlipidemia hypothyroidism tobacco abuse osteopenia, and anxiety depression, kidney stone who was brought in by EMS after she was found to be unresponsive by family member. She was apparently vomiting as well. She had taken 8 to 10 tablets of Valium because she could not sleep. She denied suicidal intent. Workup in the emergency department raised concern for aspiration pneumonia and also revealed masses in the lung. She was hospitalized for further management. The Palliative medicine team has been asked to get involved for goals of care in the setting of possible metastatic disease.   CT chest- multiple enlarged hilar lymph nodes, Lungs: large L apical mass 6.2x4.6.4.5cm, spiculated R upper lobe solid mass, left lower lobe nodule, 3 L upper lobe nodules CT abdomen- multiple small liver lesions in both lobes; kidney lesion,   Evaluated by psychiatry and recommendations have been made for inpatient psychiatric placement for untreated major depressive disorder, generalized anxiety depression related to hypothyroid. She had significant hypothyroidism with TSH of 66.5888 on admission.   Psychiatry inpatient recommendation has been rescinded.   Subjective: Chart reviewed including labs, progress notes, imaging from this and previous encounters.  Patient  sitting up in bed. Her siblings at bedside.  Emotional support provided to family.    Review of Systems  Unable to perform ROS: Mental status change     Physical Exam Vitals and nursing note reviewed.  Constitutional:      General: She is not in acute distress. Cardiovascular:     Rate and Rhythm: Normal rate.  Pulmonary:     Effort: Pulmonary effort is normal.  Neurological:     Comments: Very hard of hearing             Vital Signs: BP 120/65 (BP Location: Left Arm)   Pulse 73   Temp 98.3 F (36.8 C) (Oral)   Resp 16   Ht 5\' 2"  (1.575 m)  Wt 92.9 kg   SpO2 95%   BMI 37.48 kg/m  SpO2: SpO2: 95 % O2 Device: O2 Device: Room Air O2 Flow Rate: O2 Flow Rate (L/min): 3 L/min  Intake/output summary:  Intake/Output Summary (Last 24 hours) at 07/06/2023 1256 Last data filed at 07/06/2023 0900 Gross per 24 hour  Intake 120 ml  Output 800 ml  Net -680 ml   LBM: Last BM Date : 07/01/23 Baseline Weight: Weight: 98.4 kg Most recent weight: Weight: 92.9 kg           Patient Active Problem List   Diagnosis Date Noted   Acute toxic encephalopathy 07/02/2023   Metastasis to liver (HCC) 07/02/2023   AKI (acute kidney injury) (HCC) 07/02/2023   Class II obesity 07/02/2023   Primary squamous cell carcinoma of upper lobe of left lung (HCC) 06/29/2023   Class 3 obesity 06/28/2023   Normocytic anemia 06/28/2023   Essential hypertension 06/28/2023   Severe episode of recurrent major depressive disorder, without psychotic features (HCC) 06/24/2023   Benzodiazepine overdose, accidental or unintentional, initial encounter 06/22/2023   Aspiration pneumonia (HCC) 06/22/2023   Prolonged QT interval 06/22/2023   Nocturnal hypoxemia 08/10/2015   Obesity (BMI 30-39.9) 05/19/2013   Elevated BP 08/31/2012   Routine general medical examination at a health care facility 08/30/2012   Hyperlipidemia 01/16/2009   VITAMIN D DEFICIENCY 01/05/2009   UNSPECIFIED SUBJECTIVE VISUAL  DISTURBANCE 01/05/2009   WART, RIGHT HAND 04/02/2008   HYPOTHYROIDISM, POST-RADIOACTIVE IODINE 04/02/2008   TOBACCO USE 04/02/2008   OSTEOPENIA 04/02/2008    Palliative Care Assessment & Plan    Assessment/Recommendations/Plan  Lung cancer metastatic to liver- pt prefers no cancer directed treatments- would like eventual hospice support after SNF stay TOC order placed for Palliative to see at SNF Symptom management-  Noted last week morphine 2.5mg  was changed to hydrocodone (appears this was to limit narcotics however, hydrocodone dose actually dose equivocally higher than the 2.5mg  of morphine)  Pain: patient having some grimacing and a little pain today- will restart morphine that was working well for her Cough- continue robitussin prn   Code Status: DNR  Prognosis:  Unable to determine  Discharge Planning: To Be Determined  Care plan was discussed with patient and family.  Thank you for allowing the Palliative Medicine Team to assist in the care of this patient.  Total time:  60 minutes Prolonged billing:  Time includes:   Preparing to see the patient (e.g., review of tests) Obtaining and/or reviewing separately obtained history Performing a medically necessary appropriate examination and/or evaluation Counseling and educating the patient/family/caregiver Ordering medications, tests, or procedures Referring and communicating with other health care professionals (when not reported separately) Documenting clinical information in the electronic or other health record Independently interpreting results (not reported separately) and communicating results to the patient/family/caregiver Care coordination (not reported separately) Clinical documentation  Ocie Bob, AGNP-C Palliative Medicine   Please contact Palliative Medicine Team phone at [phone] for questions and concerns.

## 2023-07-06 NOTE — Progress Notes (Signed)
PROGRESS NOTE    Yolanda Reeves  VQQ:[id number] DOB: Sep 21, 1945 DOA: 06/22/2023 PCP: Pearline Cables, MD   Brief Narrative:  78 y.o. F with hypothyroidism, obesity, anxiety who was found unresponsive by family member after taking 8-10 tablets of diazepam to sleep. In the ER, found to have aspiration pneumonia, new lung masses. Admitted on antibiotics for presumed pneumonia.  Stabilized and underwent biopsy that showed lung cancer metastatic to liver. Oncology consulted, discussed treatment options and patient and family elected to defer chemo and focus on comfort.     At this time patient continues to improve, medically stable for discharge, plan at this time is for discharge to SNF for increased physical activity and stabilization for ultimate disposition is eventually hospice whether this is at home or facility will depend on patient's recovery at rehab.  Assessment & Plan:   Principal Problem:   Aspiration pneumonia (HCC) Active Problems:   Benzodiazepine overdose, accidental or unintentional, initial encounter   HYPOTHYROIDISM, POST-RADIOACTIVE IODINE   Hyperlipidemia   Prolonged QT interval   Severe episode of recurrent major depressive disorder, without psychotic features (HCC)   Class 3 obesity   Normocytic anemia   Essential hypertension   Primary squamous cell carcinoma of upper lobe of left lung (HCC)   Acute toxic encephalopathy   Metastasis to liver (HCC)   AKI (acute kidney injury) (HCC)   Class II obesity   Aspiration pneumonia (HCC) Completed 7-day course of antibiotics, now resolved    Benzodiazepine overdose, accidental or unintentional, initial encounter -Noted to be accidental, difficulty sleeping and took more medication than intended -Psychiatry initially evaluated, patient stable for discharge, behavior not consistent with self-harm -Likely etiology of aspiration pneumonia as above   Primary squamous cell carcinoma of upper lobe of left lung  (HCC) Metastasis to liver (HCC) Underwent CT guided liver biopsy on 1/21 which showed squamous cancer, metastatic from lung. Right kidney lesion also noted.  Left adrenal lesion also noted.  Uncertain if these are incidental findings or metastases.   Oncology and Palliative care were consulted.  Therapy options discussed with patient and family, and the agreed in concert to defer treatment and pursue comfort/palliative measures only.  Prognosis without treatment suspected to be 3-6 months.   Severe hypothyroidism -TSH elevated to 66, continue levothyroxine (previously noncompliant) -Recheck TSH in 3 to 6 months as appropriate  Class II obesity Body mass index is 37.48 kg/m.   Acute kidney injury, resolved Cr and BUN elevated on admisison.  Cr 1.3 on admission, improved to 0.9 with fluids.  Acute toxic encephalopathy -In the setting of increased concurrent oxycodone and benzodiazepine administration. -Patient in charge of all medication with questionable underlying dementia, family educated on need for medication monitoring -Patient back to baseline at this point, continue analgesics and benzodiazepines as appropriate.  Increased dose as tolerated given diagnoses as above. -Increased pain regimen today to oxycodone 5 to 10 mg every 3 hours as needed as well as low-dose IV Dilaudid for breakthrough pain   Essential hypertension Stop amlodipine, borderline hypotension   Normocytic anemia, likely anemia of chronic disease Hgb stable   Class 3 obesity BMI 41   Severe episode of recurrent major depressive disorder, without psychotic features (HCC) - Continue Buspar, Lamictal   Prolonged QT interval Resolved   Hyperlipidemia -Stop statin in the setting of above  DVT prophylaxis: SCDs Start: 06/22/23 2322   Code Status:   Code Status: Do not attempt resuscitation (DNR) - Comfort care  Family Communication: At  bedside  Status is: Inpatient  Dispo: The patient is from: Home               Anticipated d/c is to: SNF              Anticipated d/c date is: Imminent              Patient currently is medically stable for discharge  Procedures:  None  Antimicrobials:  None  Subjective: No acute issues or events overnight, patient complains left pleuritic pain is still poorly controlled, discussed increased regimen with patient and family today.  Otherwise denies nausea vomiting diarrhea constipation headache fevers chills or chest pain.  Objective: Vitals:   07/05/23 1246 07/05/23 1337 07/05/23 2205 07/06/23 0551  BP: (!) 96/57 (!) 93/57 125/68 (!) 153/93  Pulse: 71 72 69 67  Resp: 20  20 20   Temp: 97.6 F (36.4 C)  97.6 F (36.4 C) 98 F (36.7 C)  TempSrc: Oral  Oral Oral  SpO2: 92%  98% 97%  Weight:      Height:        Intake/Output Summary (Last 24 hours) at 07/06/2023 0724 Last data filed at 07/06/2023 0550 Gross per 24 hour  Intake 120 ml  Output 800 ml  Net -680 ml   Filed Weights   06/22/23 1752 06/23/23 1827  Weight: 98.4 kg 92.9 kg    Examination:  General: Awake alert oriented to person and place, delayed responses but appropriate. Lungs:  Clear to auscultate bilaterally without rhonchi, wheeze, or rales. Heart:  Regular rate and rhythm.  Without murmurs, rubs, or gallops. Abdomen:  Soft, nontender, nondistended, obese. Extremities: Without cyanosis, clubbing, edema, or obvious deformity. Skin:  Warm and dry, no erythema.  Data Reviewed: I have personally reviewed following labs and imaging studies  CBC: Recent Labs  Lab 07/06/23 0439  WBC 6.2  HGB 10.8*  HCT 34.7*  MCV 96.9  PLT 170   Basic Metabolic Panel: Recent Labs  Lab 07/06/23 0439  NA 136  K 4.2  CL 105  CO2 23  GLUCOSE 94  BUN 15  CREATININE 0.74  CALCIUM 8.3*   GFR: Estimated Creatinine Clearance: 63.5 mL/min (by C-G formula based on SCr of 0.74 mg/dL). Liver Function Tests: Recent Labs  Lab 07/06/23 0439  AST 9*  ALT 8  ALKPHOS 65  BILITOT 0.6   PROT 6.5  ALBUMIN 3.1*   No results found for this or any previous visit (from the past 240 hours).   Radiology Studies: No results found.  Scheduled Meds:  acetaminophen  650 mg Oral TID   escitalopram  5 mg Oral Daily   guaiFENesin  600 mg Oral BID   lamoTRIgine  150 mg Oral Daily   levothyroxine  150 mcg Oral QAC breakfast   nicotine  14 mg Transdermal Daily   senna-docusate  2 tablet Oral BID   Continuous Infusions:   LOS: 11 days   Time spent:  Azucena Fallen, DO [facility]  If 7PM-7AM, please contact night-coverage [email or web address]  07/06/2023, 7:24 AM

## 2023-07-07 DIAGNOSIS — N179 Acute kidney failure, unspecified: Secondary | ICD-10-CM | POA: Diagnosis not present

## 2023-07-07 DIAGNOSIS — G929 Unspecified toxic encephalopathy: Secondary | ICD-10-CM | POA: Diagnosis not present

## 2023-07-07 DIAGNOSIS — J690 Pneumonitis due to inhalation of food and vomit: Secondary | ICD-10-CM | POA: Diagnosis not present

## 2023-07-07 DIAGNOSIS — T424X1A Poisoning by benzodiazepines, accidental (unintentional), initial encounter: Secondary | ICD-10-CM | POA: Diagnosis not present

## 2023-07-07 MED ORDER — HYDROMORPHONE HCL 1 MG/ML IJ SOLN
0.5000 mg | Freq: Once | INTRAMUSCULAR | Status: AC
  Administered 2023-07-07: 0.5 mg via SUBCUTANEOUS
  Filled 2023-07-07: qty 0.5

## 2023-07-07 MED ORDER — ENSURE ENLIVE PO LIQD
237.0000 mL | Freq: Two times a day (BID) | ORAL | Status: DC
  Administered 2023-07-07 – 2023-07-08 (×2): 237 mL via ORAL

## 2023-07-07 NOTE — TOC Progression Note (Addendum)
Transition of Care [facility]) - Progression Note    Patient Details  Name: Yolanda Reeves MRN: [id number] Date of Birth: [birth year]  Transition of Care [facility]) CM/SW Contact  Princella Ion, LCSW Phone Number: 07/07/2023, 9:18 AM  Clinical Narrative:    Yolanda Reeves was started and pending for [facility]. PASRR: [id number] Yolanda Reeves at [facility] aware that Yolanda Reeves is started and of PASRR.   Addend @ 2:25 PM Auth approved. Yolanda Reeves states pt will need to admit on Saturday. Please call her in the morning per her request [phone]).   Expected Discharge Plan: Skilled Nursing Facility Barriers to Discharge: Awaiting State Approval (PASRR), Insurance Authorization  Expected Discharge Plan and Services In-house Referral: Clinical Social Work     Living arrangements for the past 2 months: Single Family Home                 DME Arranged: N/A DME Agency: NA                   Social Determinants of Health (SDOH) Interventions SDOH Screenings   Food Insecurity: No Food Insecurity (06/23/2023)  Housing: Low Risk  (06/23/2023)  Transportation Needs: Unmet Transportation Needs (06/23/2023)  Utilities: Not At Risk (06/23/2023)  Alcohol Screen: Low Risk  (10/25/2021)  Depression (PHQ2-9): Low Risk  (10/25/2021)  Financial Resource Strain: Low Risk  (10/25/2021)  Physical Activity: Insufficiently Active (10/25/2021)  Social Connections: Moderately Isolated (06/23/2023)  Stress: Stress Concern Present (10/25/2021)  Tobacco Use: Medium Risk (06/25/2023)    Readmission Risk Interventions     No data to display

## 2023-07-07 NOTE — Progress Notes (Signed)
Palliative-  Chart reviewed.  Called and spoke with patient's sister Yolanda Reeves. Patient is doing well, very comfortable today, has just had a bath. Morphine is working well for pain.  Yolanda Reeves met with Susy Frizzle from [employer] last night and he was awesome. She is feeling much better about the plan going forward for [place].   Ocie Bob, AGNP-C Palliative Medicine  No charge

## 2023-07-07 NOTE — Progress Notes (Signed)
PROGRESS NOTE    Yolanda Reeves  ZOX:[id number] DOB: 02/06/1946 DOA: 06/22/2023 PCP: Pearline Cables, MD   Brief Narrative:  78 y.o. F with hypothyroidism, obesity, anxiety who was found unresponsive by family member after taking 8-10 tablets of diazepam to sleep. In the ER, found to have aspiration pneumonia, new lung masses. Admitted on antibiotics for presumed pneumonia.  Stabilized and underwent biopsy that showed lung cancer metastatic to liver. Oncology consulted, discussed treatment options and patient and family elected to defer chemo and focus on comfort.     At this time patient continues to improve, medically stable for discharge, plan at this time is for discharge to SNF for increased physical activity and stabilization for ultimate disposition is eventually hospice whether this is at home or facility will depend on patient's recovery at rehab.  1/31: P.o. intake remained poor, sister asking about hospice facility-message sent to palliative care.  Obtained insurance authorization for SNF starting from Saturday.  Assessment & Plan:   Principal Problem:   Aspiration pneumonia (HCC) Active Problems:   Benzodiazepine overdose, accidental or unintentional, initial encounter   HYPOTHYROIDISM, POST-RADIOACTIVE IODINE   Hyperlipidemia   Prolonged QT interval   Severe episode of recurrent major depressive disorder, without psychotic features (HCC)   Class 3 obesity   Normocytic anemia   Essential hypertension   Primary squamous cell carcinoma of upper lobe of left lung (HCC)   Acute toxic encephalopathy   Metastasis to liver (HCC)   AKI (acute kidney injury) (HCC)   Class II obesity   Aspiration pneumonia (HCC) Completed 7-day course of antibiotics, now resolved    Benzodiazepine overdose, accidental or unintentional, initial encounter -Noted to be accidental, difficulty sleeping and took more medication than intended -Psychiatry initially evaluated, patient stable for  discharge, behavior not consistent with self-harm -Likely etiology of aspiration pneumonia as above   Primary squamous cell carcinoma of upper lobe of left lung (HCC) Metastasis to liver (HCC) Underwent CT guided liver biopsy on 1/21 which showed squamous cancer, metastatic from lung. Right kidney lesion also noted.  Left adrenal lesion also noted.  Uncertain if these are incidental findings or metastases.   Oncology and Palliative care were consulted.  Therapy options discussed with patient and family, and the agreed in concert to defer treatment and pursue comfort/palliative measures only.  Prognosis without treatment suspected to be 3-6 months.   Severe hypothyroidism -TSH elevated to 66, continue levothyroxine (previously noncompliant) -Recheck TSH in 3 to 6 months as appropriate  Class II obesity Body mass index is 37.48 kg/m.   Acute kidney injury, resolved Cr and BUN elevated on admisison.  Cr 1.3 on admission, improved to 0.9 with fluids.  Acute toxic encephalopathy -In the setting of increased concurrent oxycodone and benzodiazepine administration. -Patient in charge of all medication with questionable underlying dementia, family educated on need for medication monitoring -Patient back to baseline at this point, continue analgesics and benzodiazepines as appropriate.  Increased dose as tolerated given diagnoses as above. -Increased pain regimen today to oxycodone 5 to 10 mg every 3 hours as needed as well as low-dose IV Dilaudid for breakthrough pain   Essential hypertension Stop amlodipine, borderline hypotension   Normocytic anemia, likely anemia of chronic disease Hgb stable   Class 3 obesity BMI 41   Severe episode of recurrent major depressive disorder, without psychotic features (HCC) - Continue Buspar, Lamictal   Prolonged QT interval Resolved   Hyperlipidemia -Stop statin in the setting of above  DVT prophylaxis:  SCDs Start: 06/22/23 2322   Code Status:    Code Status: Do not attempt resuscitation (DNR) - Comfort care  Family Communication: At bedside  Status is: Inpatient  Dispo: The patient is from: Home              Anticipated d/c is to: SNF              Anticipated d/c date is: Imminent              Patient currently is medically stable for discharge  Procedures:  None  Antimicrobials:  None  Subjective: Patient was lying comfortably when seen today.  No new concern.  Continued to feel very weak.  Sister at bedside.  Appetite and p.o. intake remained poor  Objective: Vitals:   07/06/23 1204 07/06/23 1956 07/07/23 0445 07/07/23 1227  BP: 120/65 (!) 141/71 115/63 (!) 121/58  Pulse: 73 66 (!) 58 71  Resp: 16 16 18 18   Temp: 98.3 F (36.8 C) 97.9 F (36.6 C) 98.5 F (36.9 C) 98.8 F (37.1 C)  TempSrc: Oral Oral Oral Oral  SpO2: 95% 95% 97% 91%  Weight:      Height:        Intake/Output Summary (Last 24 hours) at 07/07/2023 1506 Last data filed at 07/07/2023 4098 Gross per 24 hour  Intake 300 ml  Output 350 ml  Net -50 ml   Filed Weights   06/22/23 1752 06/23/23 1827  Weight: 98.4 kg 92.9 kg    Examination:  General.  Frail elderly lady, in no acute distress. Pulmonary.  Lungs clear bilaterally, normal respiratory effort. CV.  Regular rate and rhythm, no JVD, rub or murmur. Abdomen.  Soft, nontender, nondistended, BS positive. CNS.  Alert and oriented .  No focal neurologic deficit. Extremities.  No edema, no cyanosis, pulses intact and symmetrical.  Data Reviewed: I have personally reviewed following labs and imaging studies  CBC: Recent Labs  Lab 07/06/23 0439  WBC 6.2  HGB 10.8*  HCT 34.7*  MCV 96.9  PLT 170   Basic Metabolic Panel: Recent Labs  Lab 07/06/23 0439  NA 136  K 4.2  CL 105  CO2 23  GLUCOSE 94  BUN 15  CREATININE 0.74  CALCIUM 8.3*   GFR: Estimated Creatinine Clearance: 63.5 mL/min (by C-G formula based on SCr of 0.74 mg/dL). Liver Function Tests: Recent Labs  Lab  07/06/23 0439  AST 9*  ALT 8  ALKPHOS 65  BILITOT 0.6  PROT 6.5  ALBUMIN 3.1*   No results found for this or any previous visit (from the past 240 hours).   Radiology Studies: No results found.  Scheduled Meds:  acetaminophen  650 mg Oral TID   escitalopram  5 mg Oral Daily   feeding supplement  237 mL Oral BID BM   guaiFENesin  600 mg Oral BID   lamoTRIgine  150 mg Oral Daily   levothyroxine  150 mcg Oral QAC breakfast   nicotine  14 mg Transdermal Daily   senna-docusate  2 tablet Oral BID   Continuous Infusions:   LOS: 12 days   Time spent: 44 min  Arnetha Courser, MD [facility]  If 7PM-7AM, please contact night-coverage [email or web address]  07/07/2023, 3:06 PM

## 2023-07-07 NOTE — Progress Notes (Signed)
Palliative-   Received message from attending provider that patient's sister requesting hospice.  Transition of care order placed for referral to hospice.   Ocie Bob, AGNP-C Palliative Medicine  No charge

## 2023-07-07 NOTE — Progress Notes (Signed)
PT Cancellation Note  Patient Details Name: Yolanda Reeves MRN: [id number] DOB: Jan 12, 1946   Cancelled Treatment:    Reason Eval/Treat Not Completed: Fatigue/lethargy limiting ability to participate (pt sleeping soundly upon my arrival, she did arouse to verbal stimuli and stated she's too tired to attempt any PT right now. Will follow.)  Tamala Ser PT 07/07/2023  Acute Rehabilitation Services  Office [phone]

## 2023-07-08 DIAGNOSIS — Z515 Encounter for palliative care: Secondary | ICD-10-CM

## 2023-07-08 DIAGNOSIS — Z66 Do not resuscitate: Secondary | ICD-10-CM

## 2023-07-08 DIAGNOSIS — C787 Secondary malignant neoplasm of liver and intrahepatic bile duct: Secondary | ICD-10-CM | POA: Diagnosis not present

## 2023-07-08 DIAGNOSIS — Z79899 Other long term (current) drug therapy: Secondary | ICD-10-CM

## 2023-07-08 DIAGNOSIS — J690 Pneumonitis due to inhalation of food and vomit: Secondary | ICD-10-CM | POA: Diagnosis not present

## 2023-07-08 DIAGNOSIS — G893 Neoplasm related pain (acute) (chronic): Secondary | ICD-10-CM

## 2023-07-08 DIAGNOSIS — Z7189 Other specified counseling: Secondary | ICD-10-CM

## 2023-07-08 DIAGNOSIS — R4589 Other symptoms and signs involving emotional state: Secondary | ICD-10-CM

## 2023-07-08 DIAGNOSIS — T424X1A Poisoning by benzodiazepines, accidental (unintentional), initial encounter: Secondary | ICD-10-CM | POA: Diagnosis not present

## 2023-07-08 MED ORDER — MORPHINE SULFATE (CONCENTRATE) 10 MG /0.5 ML PO SOLN
2.5000 mg | Freq: Four times a day (QID) | ORAL | Status: DC
  Administered 2023-07-08 – 2023-07-10 (×8): 2.6 mg via ORAL
  Filled 2023-07-08 (×8): qty 0.5

## 2023-07-08 MED ORDER — GLYCOPYRROLATE 0.2 MG/ML IJ SOLN: 0.2000 mg | INTRAMUSCULAR | Status: DC | PRN

## 2023-07-08 MED ORDER — LORAZEPAM 2 MG/ML IJ SOLN: 1.0000 mg | INTRAMUSCULAR | Status: DC | PRN

## 2023-07-08 MED ORDER — BIOTENE DRY MOUTH MT LIQD: 15.0000 mL | OROMUCOSAL | Status: DC | PRN

## 2023-07-08 MED ORDER — LORAZEPAM 2 MG/ML PO CONC
1.0000 mg | ORAL | Status: DC | PRN
  Administered 2023-07-09: 1 mg via SUBLINGUAL
  Filled 2023-07-08: qty 0.5

## 2023-07-08 MED ORDER — GLYCOPYRROLATE 1 MG PO TABS: 1.0000 mg | ORAL_TABLET | ORAL | Status: DC | PRN

## 2023-07-08 NOTE — Progress Notes (Signed)
PROGRESS NOTE    Yolanda Reeves  ZOX:[id number] DOB: August 28, 1945 DOA: 06/22/2023 PCP: Pearline Cables, MD   Brief Narrative:  78 y.o. F with hypothyroidism, obesity, anxiety who was found unresponsive by family member after taking 8-10 tablets of diazepam to sleep. In the ER, found to have aspiration pneumonia, new lung masses. Admitted on antibiotics for presumed pneumonia.  Stabilized and underwent biopsy that showed lung cancer metastatic to liver. Oncology consulted, discussed treatment options and patient and family elected to defer chemo and focus on comfort.      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  Palliative on board.  On comfort measures only.     Discharge planning: To be determined  Code Status: DNR -comfort measure Family Communication: Patient's sister, brother and niece present at bedside.  Plan of care discussed with patient family at the bedside.   Disposition Plan: Hospice Consultants:  Palliative Psychiatry Oncology  Procedures:  None   Status is: Inpatient     Subjective: Patient seen and examined.  Lying comfortably on the bed.  Family at the bedside.  No acute events overnight.  Objective: Vitals:   07/07/23 2011 07/08/23 0426 07/08/23 0800 07/08/23 1346  BP: 124/69 (!) 116/58 130/61 (!) 102/58  Pulse: 70 64 70 72  Resp:  18 16 18   Temp: 98.3 F (36.8 C) 97.8 F (36.6 C) 98.3 F (36.8 C) 98.4 F (36.9 C)  TempSrc: Oral Oral Oral Oral  SpO2: 95% 96% 95% 94%  Weight:      Height:        Intake/Output Summary  (Last 24 hours) at 07/08/2023 1413 Last data filed at 07/08/2023 1100 Gross per 24 hour  Intake 180 ml  Output 975 ml  Net -795 ml   Filed Weights   06/22/23 1752 06/23/23 1827  Weight: 98.4 kg 92.9 kg    Examination:  General exam: Appears calm and comfortable, family at the bedside.  Patient appears weak, sick and lethargic. Respiratory system: Clear to auscultation. Respiratory effort normal. Cardiovascular system: S1 & S2 heard, RRR. No JVD, murmurs, rubs, gallops or clicks. No pedal edema. Gastrointestinal system: Abdomen is nondistended, soft and nontender. No organomegaly or masses felt. Normal bowel sounds heard. Central nervous system: Alert and oriented. No focal neurological deficits. Skin: No rashes, lesions or ulcers Psychiatry: Irritable   Data Reviewed: I have personally reviewed following labs and imaging studies  CBC: Recent Labs  Lab 07/06/23 0439  WBC 6.2  HGB 10.8*  HCT 34.7*  MCV 96.9  PLT 170   Basic Metabolic Panel: Recent Labs  Lab 07/06/23 0439  NA 136  K 4.2  CL 105  CO2 23  GLUCOSE 94  BUN 15  CREATININE 0.74  CALCIUM 8.3*   GFR: Estimated Creatinine Clearance: 63.5 mL/min (by C-G formula based on SCr of 0.74 mg/dL). Liver Function Tests: Recent Labs  Lab 07/06/23 0439  AST 9*  ALT 8  ALKPHOS 65  BILITOT 0.6  PROT 6.5  ALBUMIN 3.1*   No results for input(s): "LIPASE", "AMYLASE" in the last 168 hours. No results for input(s): "AMMONIA" in the last 168 hours. Coagulation Profile: No results for input(s): "INR", "PROTIME" in  the last 168 hours. Cardiac Enzymes: No results for input(s): "CKTOTAL", "CKMB", "CKMBINDEX", "TROPONINI" in the last 168 hours. BNP (last 3 results) No results for input(s): "PROBNP" in the last 8760 hours. HbA1C: No results for input(s): "HGBA1C" in the last 72 hours. CBG: No results for input(s): "GLUCAP" in the last 168 hours. Lipid Profile: No results for input(s): "CHOL", "HDL", "LDLCALC", "TRIG",  "CHOLHDL", "LDLDIRECT" in the last 72 hours. Thyroid Function Tests: No results for input(s): "TSH", "T4TOTAL", "FREET4", "T3FREE", "THYROIDAB" in the last 72 hours. Anemia Panel: No results for input(s): "VITAMINB12", "FOLATE", "FERRITIN", "TIBC", "IRON", "RETICCTPCT" in the last 72 hours. Sepsis Labs: No results for input(s): "PROCALCITON", "LATICACIDVEN" in the last 168 hours.  No results found for this or any previous visit (from the past 240 hours).    Radiology Studies: No results found.  Scheduled Meds:  morphine CONCENTRATE  2.6 mg Oral Q6H   nicotine  14 mg Transdermal Daily   senna-docusate  2 tablet Oral BID   Continuous Infusions:   LOS: 13 days   Time spent: 35 minutes   Evy Lutterman Estill Cotta, MD [facility]  If 7PM-7AM, please contact night-coverage [email or web address] 07/08/2023, 2:13 PM

## 2023-07-08 NOTE — Progress Notes (Signed)
Daily Progress Note   Patient Name: Yolanda Reeves       Date: 07/08/2023 DOB: 01-05-1946  Age: 78 y.o. MRN#: [id number] Attending Physician: Ollen Bowl, MD Primary Care Physician: Pearline Cables, MD Admit Date: 06/22/2023 Length of Stay: 13 days  Reason for Consultation/Follow-up: Establishing goals of care  Subjective:   CC: Patient laying in bed comfortably.  Following up regarding complex medical decision making.  Subjective:  Called by patient's sister for visit today. Reviewed EMR prior to presenting to bedside.  Patient has continued to receive oral medications in the setting of comfort focused care including scheduled Tylenol and oral morphine solution 2.6 mg every 2 hours as needed x 4 doses.  Patient noted to have continued oral intake over the past 3 days and today as per EMR review.  Presented to bedside to meet with patient.  Patient seen laying comfortably in the bed.  Patient confused at times and not able to actively participate in complex medical decision making.  Patient's sister and her daughter were present at bedside.  Introduced myself as a member of the palliative medicine team.  Sister inquiring about patient being evaluated for inpatient hospice.  Plan had been for patient to go to rehab though over the past few days Sister noted that patient's energy and alertness have greatly decreased.  Patient no longer actively able to participate with rehab.  Spent time learning about patient's status at this time.  Patient's family did describe that patient is still eating and drinking even if this is overall decreased. With permission, able to spend time discussing philosophy of hospice and different settings where 1 received hospice such as home, long-term care (with out-of-pocket cost), or inpatient hospice.  Spent time discussing that the majority of patients receive hospice in the home though that is not an option for this patient at this time according to family as  they cannot support her care at home.  Discussed if someone goes to long-term care with hospice, that is still an out-of-pocket expense unless someone has long-term care insurance to cover that.  Family noted patient did not.  Discussed that to go to inpatient hospice patient has to meet certain medical criteria established by Medicare for acceptance.  Usually general guidelines include patient requiring IV symptom medications that cannot be received in the home setting with patient also having little/no oral intake as likely that patient would pass away within days to a couple of weeks.  Again noted these are just general ways patients are evaluated for inpatient hospice.  Based on patient's current assessment, she is not appropriate for inpatient hospice and is more appropriate for home with hospice or long-term care with hospice.  Discussed that this could change though if patient continues to deteriorate.  Would recommend continued evaluations for potential inpatient hospice referral when more appropriate.  Family agreeing with this plan.  We also discussed medication adjustments to be made today.  Patient awakening moaning even with oral solution morphine.  Family does feel that the current dose of oral morphine solution helps with management.  Discussed we will schedule dose of oral morphine solution so that patient does get medication more frequently to hopefully keep pain down overall.  Will also get rid of medications that no longer provide symptom relief.  Family agreeing with this plan.  Objective:   Vital Signs:  BP 130/61 (BP Location: Right Arm)   Pulse 70   Temp 98.3 F (36.8 C) (Oral)   Resp  16   Ht 5\' 2"  (1.575 m)   Wt 92.9 kg   SpO2 95%   BMI 37.48 kg/m   Physical Exam: General: NAD, alert Eyes: conjunctiva clear, anicteric sclera HENT: normocephalic, atraumatic, moist mucous membranes Cardiovascular: RRR, no edema in LE b/l Respiratory: no increased work of breathing noted,  not in respiratory distress Abdomen: not distended Extremities:  Skin: no rashes or lesions on visible skin Neuro: A&Ox4, following commands easily Psych: appropriately answers all questions  Imaging: I personally reviewed recent imaging.   Assessment & Plan:   Assessment: Patient is a 78 y.o. female with medical history significant of BEST syndrome with very decreased eyesight, hard of hearing, chronic pain, hyperlipidemia hypothyroidism tobacco abuse osteopenia, and anxiety depression, kidney stone who was brought in by EMS after she was found to be unresponsive by family member. She was apparently vomiting as well. She had taken 8 to 10 tablets of Valium because she could not sleep. She denied suicidal intent. Workup in the emergency department raised concern for aspiration pneumonia and also revealed masses in the lung. She was hospitalized for further management. The Palliative medicine team has been asked to get involved for goals of care in the setting of possible metastatic disease.   Recommendations/Plan: # Complex medical decision making/goals of care:  - Patient unable to participate in complex medical decision making at time when seen due to medical status.  -Discussed care with patient's sister and her daughter as detailed above in HPI.  Patient's brother is HCPOA.  Patient and family have already agreed to transition to comfort focused care in setting of patient having metastatic lung cancer and not seeking cancer directed therapies.  Plan had been for patient to go to rehab though patient's medical status has been deteriorating over the past few days along with oral intake.  Family now requesting evaluation for hospice.  Family unable to support patient going home with hospice.  Patient does not have long-term care insurance so long-term care would require out-of-pocket cost to receive hospice there.  Family asking for evaluation for inpatient hospice though patient not currently  medically appropriate for inpatient hospice referral.  Discussed PMT will continue to monitor and adjust medications based on patient's symptom burden.  Should patient further deteriorate to the point it is appropriate to make inpatient hospice referral, can then consider.  -  Code Status: Do not attempt resuscitation (DNR) - Comfort care  # Symptom management Patient is receiving these palliative interventions for symptom management with an intent to improve quality of life.     -Pain/Dyspnea, acute in the setting of end-of-life care                               -Continue oral morphine solution 2.6 mg every 2 hours as needed   -Start oral morphine solution 2.6 mg every 6 hours scheduled   -Continue IV Dilaudid 0.5 mg every 2 hours as needed for breakthrough after oral medications                 -Anxiety/agitation, in the setting of end-of-life care                               -Start as needed Ativan                  -Secretions, in the setting of end-of-life care                               -  Start as needed glycopyrrolate  Continue bowel regimen while receiving opioids  # Psychosocial Support:  -Brother, sister  # Discharge Planning: To Be Determined  Discussed with: Patient's sister and her daughter at bedside, hospitalist, RN, [facility]  Thank you for allowing the palliative care team to participate in the care [place].  Alvester Morin, DO Palliative Care Provider PMT # [phone]  If patient remains symptomatic despite maximum doses, please call PMT at [phone] between 0700 and 1900. Outside of these hours, please call attending, as PMT does not have night coverage.  Personally spent 52 minutes in patient care including extensive chart review (labs, imaging, progress/consult notes, vital signs), medically appropraite exam, discussed with treatment team, education to patient, family, and staff, documenting clinical information, medication review and management,  coordination of care, and available advanced directive documents.

## 2023-07-09 DIAGNOSIS — T17908A Unspecified foreign body in respiratory tract, part unspecified causing other injury, initial encounter: Secondary | ICD-10-CM | POA: Diagnosis not present

## 2023-07-09 DIAGNOSIS — J690 Pneumonitis due to inhalation of food and vomit: Secondary | ICD-10-CM | POA: Diagnosis not present

## 2023-07-09 DIAGNOSIS — Z7189 Other specified counseling: Secondary | ICD-10-CM | POA: Diagnosis not present

## 2023-07-09 DIAGNOSIS — Z515 Encounter for palliative care: Secondary | ICD-10-CM | POA: Diagnosis not present

## 2023-07-09 DIAGNOSIS — G893 Neoplasm related pain (acute) (chronic): Secondary | ICD-10-CM | POA: Diagnosis not present

## 2023-07-09 NOTE — Progress Notes (Addendum)
PROGRESS NOTE    Yolanda Reeves  LKG:[id number] DOB: March 22, 1946 DOA: 06/22/2023 PCP: Pearline Cables, MD   Brief Narrative:  78 y.o. F with hypothyroidism, obesity, anxiety who was found unresponsive by family member after taking 8-10 tablets of diazepam to sleep. In the ER, found to have aspiration pneumonia, new lung masses. Admitted on antibiotics for presumed pneumonia.  Stabilized and underwent biopsy that showed lung cancer metastatic to liver. Oncology consulted, discussed treatment options and patient and family elected to defer chemo and focus on comfort.      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  Palliative on board.  On comfort measures only.     Discharge planning: Initial plan was patient to go to rehab however patient's medical status has been deteriorating over the couple of days along with poor p.o. intake.  Family requested for evaluation for inpatient hospice as they are unable to support patient going home with hospice and also patient does not have long-term care insurance so long-term care would cost out-of-pocket to receive hospice care.  Per palliative-consider inpatient hospice referral if patient further deteriorated  Code Status: DNR -comfort measure Family Communication: Patient's sister, brother and niece present at bedside.  Plan of care discussed with patient family at the bedside.   Disposition Plan: Hospice Consultants:  Palliative Psychiatry Oncology  Procedures:  None   Status is: Inpatient     Subjective: Patient  seen and examined.  Sleeping comfortably on the bed.  Brother at the bedside.  No acute events overnight.  Objective: Vitals:   07/08/23 0426 07/08/23 0800 07/08/23 1346 07/08/23 2329  BP: (!) 116/58 130/61 (!) 102/58 123/70  Pulse: 64 70 72 78  Resp: 18 16 18 16   Temp: 97.8 F (36.6 C) 98.3 F (36.8 C) 98.4 F (36.9 C) 97.9 F (36.6 C)  TempSrc: Oral Oral Oral Oral  SpO2: 96% 95% 94% 93%  Weight:      Height:        Intake/Output Summary (Last 24 hours) at 07/09/2023 1316 Last data filed at 07/09/2023 1230 Gross per 24 hour  Intake 120 ml  Output 400 ml  Net -280 ml   Filed Weights   06/22/23 1752 06/23/23 1827  Weight: 98.4 kg 92.9 kg    Examination:  General exam: Calm, comfortable, sleeping.  Brother at the bedside. Respiratory system: Clear to auscultation. Respiratory effort normal. Cardiovascular system: S1 & S2 heard, RRR. No JVD, murmurs, rubs, gallops or    Data Reviewed: I have personally reviewed following labs and imaging studies  CBC: Recent Labs  Lab 07/06/23 0439  WBC 6.2  HGB 10.8*  HCT 34.7*  MCV 96.9  PLT 170   Basic Metabolic Panel: Recent Labs  Lab 07/06/23 0439  NA 136  K 4.2  CL 105  CO2 23  GLUCOSE 94  BUN 15  CREATININE 0.74  CALCIUM 8.3*   GFR: Estimated Creatinine Clearance: 63.5 mL/min (by C-G formula based on SCr of 0.74 mg/dL). Liver Function Tests: Recent Labs  Lab 07/06/23 0439  AST 9*  ALT 8  ALKPHOS 65  BILITOT 0.6  PROT 6.5  ALBUMIN 3.1*   No results for input(s): "LIPASE", "  AMYLASE" in the last 168 hours. No results for input(s): "AMMONIA" in the last 168 hours. Coagulation Profile: No results for input(s): "INR", "PROTIME" in the last 168 hours. Cardiac Enzymes: No results for input(s): "CKTOTAL", "CKMB", "CKMBINDEX", "TROPONINI" in the last 168 hours. BNP (last 3 results) No results for input(s): "PROBNP" in the last 8760 hours. HbA1C: No results for input(s): "HGBA1C" in the last 72  hours. CBG: No results for input(s): "GLUCAP" in the last 168 hours. Lipid Profile: No results for input(s): "CHOL", "HDL", "LDLCALC", "TRIG", "CHOLHDL", "LDLDIRECT" in the last 72 hours. Thyroid Function Tests: No results for input(s): "TSH", "T4TOTAL", "FREET4", "T3FREE", "THYROIDAB" in the last 72 hours. Anemia Panel: No results for input(s): "VITAMINB12", "FOLATE", "FERRITIN", "TIBC", "IRON", "RETICCTPCT" in the last 72 hours. Sepsis Labs: No results for input(s): "PROCALCITON", "LATICACIDVEN" in the last 168 hours.  No results found for this or any previous visit (from the past 240 hours).    Radiology Studies: No results found.  Scheduled Meds:  morphine CONCENTRATE  2.6 mg Oral Q6H   nicotine  14 mg Transdermal Daily   senna-docusate  2 tablet Oral BID   Continuous Infusions:   LOS: 14 days   Time spent: 35 minutes   Clayborn Milnes Estill Cotta, MD [facility]  If 7PM-7AM, please contact night-coverage [email or web address] 07/09/2023, 1:16 PM

## 2023-07-09 NOTE — Progress Notes (Signed)
Daily Progress Note   Patient Name: Yolanda Reeves       Date: 07/09/2023 DOB: 03/06/1946  Age: 78 y.o. MRN#: [id number] Attending Physician: Ollen Bowl, MD Primary Care Physician: Pearline Cables, MD Admit Date: 06/22/2023 Length of Stay: 14 days  Reason for Consultation/Follow-up: Establishing goals of care  Subjective:   CC: Patient laying in bed comfortably.  Following up regarding complex medical decision making.  Subjective:  Reviewed EMR prior to presenting to bedside.  At time of EMR review patient has continued to receive oral solution scheduled morphine 2.6 mg every 6 hours.  Patient has also required as needed medication of Ativan 1 mg x 1 dose and morphine 2.6 mg x 2 doses in addition to her scheduled medication.  Patient does have decrease in oral intake overall.  Presented to bedside to meet with patient.  Patient seen sleeping comfortably in the bed.  Patient would awaken though very tired and minimally interacting.  Patient did feel her pain was better controlled though during conversation she did groan with worsening right arm pain so asked nurse to provide with as needed dose. Spoke to family members at bedside including patient's niece.  Patient's sister had gone home because she was not feeling well herself.  Patient's brother who is HCPOA will present to bedside tomorrow.  Yolanda Reeves report being in communication with 1 of mother.  Discussed continue monitoring at this point.  Would expect patient's medical status continued deterioration.  Based on appropriateness, could potentially involve TOC tomorrow for possible inpatient hospice evaluation.  Family agreeing with this plan.  Spent time answering questions as able and noted palliative medicine team will continue to follow with patient's medical journey.  Objective:   Vital Signs:  BP 123/70 (BP Location: Right Arm)   Pulse 78   Temp 97.9 F (36.6 C) (Oral)   Resp 16   Ht 5\' 2"  (1.575 m)   Wt 92.9 kg   SpO2  93%   BMI 37.48 kg/m   Physical Exam: General: Chronically ill-appearing, lethargic, laying in bed HENT:  dry mucous membranes Cardiovascular: RRR Respiratory: no increased work of breathing noted, not in respiratory distress  Neuro: Lethargic  Imaging: I personally reviewed recent imaging.   Assessment & Plan:   Assessment: Patient is a 78 y.o. female with medical history significant of BEST syndrome with very decreased eyesight, hard of hearing, chronic pain, hyperlipidemia hypothyroidism tobacco abuse osteopenia, and anxiety depression, kidney stone who was brought in by EMS after she was found to be unresponsive by family member. She was apparently vomiting as well. She had taken 8 to 10 tablets of Valium because she could not sleep. She denied suicidal intent. Workup in the emergency department raised concern for aspiration pneumonia and also revealed masses in the lung. She was hospitalized for further management. The Palliative medicine team has been asked to get involved for goals of care in the setting of possible metastatic disease.   Recommendations/Plan: # Complex medical decision making/goals of care:  - Patient unable to participate in complex medical decision making at time when seen due to medical status.  -Discussed care with patient's family at bedside as detailed above in HPI.  Patient's brother is HCPOA.  Patient and family have already agreed to transition to comfort focused care in setting of patient having metastatic lung cancer and not seeking cancer directed therapies.  Patient and family opting that patient is no longer appropriate for rehab due to continued medical deterioration.  Family unable to support patient going home with hospice.  Patient continues to medically deteriorate daily.  Continuing to monitor for possible inpatient hospice referral and evaluation.  -  Code Status: Do not attempt resuscitation (DNR) - Comfort care  # Symptom management Patient is  receiving these palliative interventions for symptom management with an intent to improve quality of life.     -Pain/Dyspnea, acute in the setting of end-of-life care                               -Continue oral morphine solution 2.6 mg every 2 hours as needed   -Continue oral morphine solution 2.6 mg every 6 hours scheduled   -Continue IV Dilaudid 0.5 mg every 2 hours as needed for breakthrough after oral medications                 -Anxiety/agitation, in the setting of end-of-life care                               -Continue as needed Ativan                  -Secretions, in the setting of end-of-life care                               -Continue as needed glycopyrrolate  Continue bowel regimen while receiving opioids  # Psychosocial Support:  -Brother, sister  # Discharge Planning: To Be Determined  Thank you for allowing the palliative care team to participate in the care Yolanda Reeves.  Alvester Morin, DO Palliative Care Provider PMT # [phone]  If patient remains symptomatic despite maximum doses, please call PMT at [phone] between 0700 and 1900. Outside of these hours, please call attending, as PMT does not have night coverage.  Personally spent 37 minutes in patient care including extensive chart review (labs, imaging, progress/consult notes, vital signs), medically appropraite exam, discussed with treatment team, education to patient, family, and staff, documenting clinical information, medication review and management, coordination of care, and available advanced directive documents.

## 2023-07-10 DIAGNOSIS — G893 Neoplasm related pain (acute) (chronic): Secondary | ICD-10-CM | POA: Diagnosis not present

## 2023-07-10 DIAGNOSIS — Z515 Encounter for palliative care: Secondary | ICD-10-CM | POA: Diagnosis not present

## 2023-07-10 DIAGNOSIS — Z7189 Other specified counseling: Secondary | ICD-10-CM | POA: Diagnosis not present

## 2023-07-10 DIAGNOSIS — J690 Pneumonitis due to inhalation of food and vomit: Secondary | ICD-10-CM | POA: Diagnosis not present

## 2023-07-10 DIAGNOSIS — C787 Secondary malignant neoplasm of liver and intrahepatic bile duct: Secondary | ICD-10-CM | POA: Diagnosis not present

## 2023-07-10 DIAGNOSIS — Z66 Do not resuscitate: Secondary | ICD-10-CM

## 2023-07-10 DIAGNOSIS — F332 Major depressive disorder, recurrent severe without psychotic features: Secondary | ICD-10-CM | POA: Diagnosis not present

## 2023-07-10 DIAGNOSIS — Z79899 Other long term (current) drug therapy: Secondary | ICD-10-CM

## 2023-07-10 MED ORDER — MORPHINE SULFATE (CONCENTRATE) 10 MG /0.5 ML PO SOLN: 5.0000 mg | ORAL | Status: DC | PRN

## 2023-07-10 MED ORDER — MORPHINE SULFATE (CONCENTRATE) 10 MG /0.5 ML PO SOLN
5.0000 mg | Freq: Four times a day (QID) | ORAL | Status: DC
  Administered 2023-07-10: 5 mg via ORAL
  Filled 2023-07-10: qty 0.5

## 2023-07-10 MED ORDER — MORPHINE SULFATE (PF) 2 MG/ML IV SOLN
1.0000 mg | INTRAVENOUS | Status: DC | PRN
  Administered 2023-07-10: 2 mg via INTRAVENOUS
  Filled 2023-07-10: qty 1

## 2023-07-10 MED ORDER — BISACODYL 10 MG RE SUPP: 10.0000 mg | Freq: Every day | RECTAL | Status: DC | PRN

## 2023-07-10 MED ORDER — OXYCODONE HCL 5 MG PO TABS
10.0000 mg | ORAL_TABLET | ORAL | Status: DC | PRN
  Administered 2023-07-10: 10 mg via ORAL
  Filled 2023-07-10: qty 2

## 2023-07-10 NOTE — Progress Notes (Signed)
Progress Note   Patient: Yolanda Reeves:[id number] DOB: 1945-09-04 DOA: 06/22/2023     15 DOS: the patient was seen and examined on 07/10/2023   Brief hospital course: 78 y.o. F with hypothyroidism, obesity, anxiety who was found unresponsive by family member after taking 8-10 tablets of diazepam to sleep. In the ER, found to have aspiration pneumonia, new lung masses. Admitted on antibiotics for presumed pneumonia. Stabilized and underwent biopsy that showed lung cancer metastatic to liver. Oncology consulted, discussed treatment options and patient and family elected to defer chemo and focus on comfort.   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 Palliative on board. On comfort measures only.      Her condition deteriorated and may not go to facility. Family requested for evaluation for inpatient hospice as they are unable to support patient going home with hospice and also patient does not have long-term care insurance so long-term care would cost out-of-pocket to receive hospice care.   TOC working on inpatient hospice facility.  Continue dilaudid for pain as morphine caused Continue Nursing supportive care. DVT prophylaxis -none   Code Status: Do not attempt resuscitation (DNR) - Comfort care  Subjective: Patient is seen and examined today morning. She is resting comfortably. Sister at bedside. She is eating poor. Got IV line and pain meds per RN.  Physical Exam: Vitals:   07/08/23 1346 07/08/23 2329 07/09/23 1453 07/09/23 2201  BP: (!) 102/58 123/70 118/60 (!) 99/52   Pulse: 72 78 80 73  Resp: 18 16 14 16   Temp: 98.4 F (36.9 C) 97.9 F (36.6 C) 98 F (36.7 C) 98.6 F (37 C)  TempSrc: Oral Oral  Oral  SpO2: 94% 93% 91% 93%  Weight:      Height:        General - Elderly ill Caucasian female, comfortable, sleeping, no distress HEENT - PERRLA, EOMI, atraumatic head, non tender sinuses. Lung - Clear, basal rales, diffuse rhonchi, no wheezes. Heart - S1, S2 heard, no murmurs, rubs, trace pedal edema. Abdomen - Soft, non tender, bowel sounds good Neuro - Sleepy and lethargic, unable to do full neuro exam. Skin - Warm and dry.  Data Reviewed:      Latest Ref Rng & Units 07/06/2023    4:39 AM 06/29/2023    3:57 AM 06/28/2023    4:24 AM  CBC  WBC 4.0 - 10.5 K/uL 6.2  7.1  7.1   Hemoglobin 12.0 - 15.0 g/dL 81.1  91.4  78.2   Hematocrit 36.0 - 46.0 % 34.7  37.5  36.1   Platelets 150 - 400 K/uL 170  154  151       Latest Ref Rng & Units 07/06/2023    4:39 AM 06/29/2023    3:57 AM 06/28/2023    4:24 AM  BMP  Glucose 70 - 99 mg/dL 94  83  91   BUN 8 - 23 mg/dL 15  25  30    Creatinine 0.44 - 1.00 mg/dL 9.56  2.13  0.86   Sodium 135 - 145 mmol/L 136  136  136   Potassium 3.5 - 5.1 mmol/L 4.2  3.8  3.9   Chloride 98 - 111 mmol/L  105  104  104   CO2 22 - 32 mmol/L 23  23  24    Calcium 8.9 - 10.3 mg/dL 8.3  8.5  8.5    No results found.   Family Communication: Discussed with family at bedside they understand and agree. All questions answereed.  Disposition: Status is: Inpatient Remains inpatient appropriate because: Comfort care only.  Planned Discharge Destination:  unknown as her condition poor, on comfort measures     Time spent: 36 minutes  Author: Marcelino Duster, MD 07/10/2023 12:34 PM Secure chat 7am to 7pm For on call review [email or web address].

## 2023-07-10 NOTE — Progress Notes (Signed)
Daily Progress Note   Patient Name: Yolanda Reeves       Date: 07/10/2023 DOB: Mar 27, 1946  Age: 78 y.o. MRN#: [id number] Attending Physician: Marcelino Duster, MD Primary Care Physician: Pearline Cables, MD Admit Date: 06/22/2023 Length of Stay: 15 days  Reason for Consultation/Follow-up: Establishing goals of care  Subjective:   CC: Patient laying in bed with signs of pain.  Following up regarding complex medical decision making.  Subjective:  Reviewed EMR prior to presenting to bedside.  At time of EMR review in past 24 hours patient has received as needed morphine x 2 doses in addition to morphine scheduled every 6 hours.  At time of EMR review patient had not had any oral intake from food or liquid documented in the past 24 hours.  Patient noted to be hypotensive yesterday evening at 99/52.  Patient moved to 6 floor.  Presented to bedside to see patient.  Patient's sister present at bedside.  Patient laying in bed having signs of pain at this time including grimacing.  Continuing comfort focused measures at this time.  Patient continues to medically deteriorate.  Discussed placing TOC consult for inpatient hospice evaluation.  Family agreeing with this consult.  Discussed care with RN as well.  Patient showing signs of choking with liquid medications so IV will be placed to transition to IV medications for comfort.  Discontinuing oral medications at this time.  Discussed care with IDT including hospitalist, RN, and TOC.  Objective:   Vital Signs:  BP (!) 99/52 (BP Location: Right Arm)   Pulse 73   Temp 98.6 F (37 C) (Oral)   Resp 16   Ht 5\' 2"  (1.575 m)   Wt 92.9 kg   SpO2 93%   BMI 37.48 kg/m   Physical Exam: General: Chronically ill-appearing, nonverbal signs of pain present, laying in bed HENT:  dry mucous membranes Cardiovascular: RRR Respiratory: no increased work of breathing noted, not in respiratory distress  Neuro: Lethargic  Imaging: I personally  reviewed recent imaging.   Assessment & Plan:   Assessment: Patient is a 78 y.o. female with medical history significant of BEST syndrome with very decreased eyesight, hard of hearing, chronic pain, hyperlipidemia hypothyroidism tobacco abuse osteopenia, and anxiety depression, kidney stone who was brought in by EMS after she was found to be unresponsive by family member. She was apparently vomiting as well. She had taken 8 to 10 tablets of Valium because she could not sleep. She denied suicidal intent. Workup in the emergency department raised concern for aspiration pneumonia and also revealed masses in the lung. She was hospitalized for further management. The Palliative medicine team has been asked to get involved for goals of care in the setting of possible metastatic disease.   Recommendations/Plan: # Complex medical decision making/goals of care:  - Patient unable to participate in complex medical decision making at time when seen due to medical status.  -Discussed care with patient's family at bedside as detailed above in HPI.  Patient's brother is HCPOA.  Patient and family had already agreed to transition to comfort focused care in setting of patient having metastatic lung cancer and not seeking cancer directed therapies.   Family unable to support patient going home with hospice.  Patient continues to medically deteriorate daily.  Consulting TOC for inpatient hospice evaluation.  -  Code Status: Do not attempt resuscitation (DNR) - Comfort care  # Symptom management Discontinuing oral medications at this time. Patient is receiving these palliative interventions for  symptom management with an intent to improve quality of life.     -Pain/Dyspnea, acute in the setting of end-of-life care                               -Discontinue oral morphine   -Start IV morphine as needed.  Continue to to adjust based on patient's symptom burden.                 -Anxiety/agitation, in the setting of  end-of-life care                               -Continue as needed IV Ativan                  -Secretions, in the setting of end-of-life care                               -Continue as needed IV glycopyrrolate  # Psychosocial Support:  -Brother, sister, niece  # Discharge Planning: To Be Determined  Thank you for allowing the palliative care team to participate in the care Dale Sartell.  Alvester Morin, DO Palliative Care Provider PMT # [phone]  If patient remains symptomatic despite maximum doses, please call PMT at [phone] between 0700 and 1900. Outside of these hours, please call attending, as PMT does not have night coverage.

## 2023-07-10 NOTE — Progress Notes (Signed)
Pt for transfer to room 1620. Pt and sister at bedside and aware and agreeable. Report called to Melody, RN on 6E. Pt transferred in bed with all personal belongings.

## 2023-07-10 NOTE — Discharge Summary (Signed)
Physician Discharge Summary   Patient: Yolanda Reeves MRN: [id number] DOB: 1945/09/23  Admit date:     06/22/2023  Discharge date: 07/10/23  Discharge Physician: Marcelino Duster   PCP: Pearline Cables, MD   Recommendations at discharge:    Hospice facility discharge  Discharge Diagnoses: Principal Problem:   Aspiration pneumonia (HCC) Active Problems:   Benzodiazepine overdose, accidental or unintentional, initial encounter   HYPOTHYROIDISM, POST-RADIOACTIVE IODINE   Hyperlipidemia   Prolonged QT interval   Severe episode of recurrent major depressive disorder, without psychotic features (HCC)   Class 3 obesity   Normocytic anemia   Essential hypertension   Primary squamous cell carcinoma of upper lobe of left lung (HCC)   Acute toxic encephalopathy   Metastasis to liver (HCC)   AKI (acute kidney injury) (HCC)   Class II obesity   Need for emotional support   DNR (do not resuscitate)   Cancer associated pain   Medication management   Counseling and coordination of care   Palliative care encounter   Aspiration into airway   High risk medication use  Resolved Problems:   * No resolved hospital problems. [facility] Course: 78 y.o. F with hypothyroidism, obesity, anxiety who was found unresponsive by family member after taking 8-10 tablets of diazepam to sleep. In the ER, found to have aspiration pneumonia, new lung masses. Admitted on antibiotics for presumed pneumonia. Stabilized and underwent biopsy that showed lung cancer metastatic to liver. Oncology consulted, discussed treatment options and patient and family elected to defer chemo and focus on comfort.    Assessment and Plan: Aspiration pneumonia Benzodiazepine overdose, accidental or unintentional, initial encounter Primary squamous cell carcinoma of upper lobe of left lung Metastasis to liver Severe hypothyroidism AKI Acute toxic encephalopathy Essential hypertension Normocytic anemia Class III  obesity Severe episode of recurrent major depressive disorder without psychotic features Prolonged QT interval Hyperlipidemia   Her overall prognosis poor. Palliative on board. On comfort measures only.      Her condition deteriorated and may not go to facility. Family requested for evaluation for inpatient hospice as they are unable to support patient going home with hospice and also patient does not have long-term care insurance so long-term care would cost out-of-pocket to receive hospice care.  TOC worked on inpatient hospice facility.  Patient is being discharged inpatient hospice facility.   Continue IV pain and anxiety as needed medications at hospice facility Continue Nursing supportive care.    Code Status: Do not attempt resuscitation (DNR) - Comfort care      Consultants: Oncology, psychiatry, palliative care Procedures performed: None Disposition: Hospice care Diet recommendation:  NPO pleasure feeds DISCHARGE MEDICATION: Allergies as of 07/10/2023   No Known Allergies      Medication List     STOP taking these medications    albuterol 108 (90 Base) MCG/ACT inhaler Commonly known as: VENTOLIN HFA   diazepam 5 MG tablet Commonly known as: VALIUM   ergocalciferol 1.25 MG (50000 UT) capsule Commonly known as: VITAMIN D2   Fish Oil 500 MG Caps   lamoTRIgine 150 MG tablet Commonly known as: LAMICTAL   levothyroxine 125 MCG tablet Commonly known as: SYNTHROID   lovastatin 20 MG tablet Commonly known as: MEVACOR   ondansetron 4 MG disintegrating tablet Commonly known as: Zofran ODT   sertraline 100 MG tablet Commonly known as: ZOLOFT        Contact information for follow-up providers     [facility] Follow  up.   Why: Home Health will follow up 24hr to 48hrs after discharge Contact information: [address] [place] [address] [phone]         [facility] [employer]. Call.    Contact information: [address] [place], [place] [address] [phone]             Contact information for after-discharge care     Destination     [facility] Preferred SNF .   Service: Skilled Nursing Contact information: [address] [place] [address] [phone]                    Discharge Exam: Ceasar Mons Weights   06/22/23 1752 06/23/23 1827  Weight: 98.4 kg 92.9 kg   General - Elderly ill Caucasian female, comfortable, sleeping, no distress HEENT - PERRLA, EOMI, atraumatic head, non tender sinuses. Lung - Clear, basal rales, diffuse rhonchi, no wheezes. Heart - S1, S2 heard, no murmurs, rubs, trace pedal edema. Abdomen - Soft, non tender, bowel sounds good Neuro - Sleepy and lethargic, unable to do full neuro exam. Skin - Warm and dry.  Condition at discharge: poor  The results of significant diagnostics from this hospitalization (including imaging, microbiology, ancillary and laboratory) are listed below for reference.   Imaging Studies: DG Shoulder Left Result Date: 07/01/2023 CLINICAL DATA:  Shoulder pain. Patient reports fall 3 days ago. Chronic shoulder pain, increased after fall. EXAM: LEFT SHOULDER - 2+ VIEW COMPARISON:  None Available. FINDINGS: There is no evidence of fracture or dislocation. The alignment is normal. There is trace glenoid spurring. No erosions or focal bone abnormality. Soft tissues are unremarkable. Left upper lobe masslike opacity which was characterized on chest CT. IMPRESSION: No fracture or dislocation.  Mild glenohumeral degenerative change. Electronically Signed   By: Narda Rutherford M.D.   On: 07/01/2023 15:59   US BIOPSY (LIVER) Result Date: 06/27/2023 INDICATION: [id number] Liver lesion [id number] EXAM: ULTRASOUND GUIDED LIVER MASS BIOPSY COMPARISON:  Ultrasound RIGHT upper quadrant, earlier same day. CT AP, 06/23/2023. MR abdomen, 06/25/2023. MEDICATIONS: None ANESTHESIA/SEDATION: Moderate  (conscious) sedation was employed during this procedure. A total of Versed 1 mg and Fentanyl 50 mcg was administered intravenously. Moderate Sedation Time: 10 minutes. The patient's level of consciousness and vital signs were monitored continuously by radiology nursing throughout the procedure under my direct supervision. COMPLICATIONS: None immediate. PROCEDURE: Informed written consent was obtained from insufficient wrap after a discussion of the risks, benefits and alternatives to treatment. The patient understands and consents the procedure. A timeout was performed prior to the initiation of the procedure. Ultrasound scanning was performed of the right upper abdominal quadrant demonstrates small rounded LEFT hepatic lobe mass The LEFT hepatic lobe mass was selected for biopsy and the procedure was planned. The right upper abdominal quadrant was prepped and draped in the usual sterile fashion. The overlying soft tissues were anesthetized with 1% lidocaine with epinephrine. A 17 gauge, 6.8 cm co-axial needle was advanced into a peripheral aspect of the lesion. This was followed by 4 core biopsies with an 18 gauge core device under direct ultrasound guidance. Manual compression was applied for superficial hemostasis. Post procedural scanning was negative for definitive area of hemorrhage or additional complication. A dressing was placed. The patient tolerated the procedure well without immediate post procedural complication. IMPRESSION: Successful ultrasound guided core needle biopsy of the LEFT hepatic lobe mass. Roanna Banning, MD [facility] Electronically Signed  By: Roanna Banning M.D.   On: 06/27/2023 15:57   US Abdomen Limited RUQ (LIVER/GB) Result Date: 06/27/2023 CLINICAL DATA:  [id number] Liver lesion [id number] EXAM: ULTRASOUND ABDOMEN LIMITED RIGHT UPPER QUADRANT COMPARISON:  CT AP, 06/23/2023.  MR abdomen, 06/25/2023. FINDINGS: Suboptimal evaluation,  with motion degradation and poor acoustic penetration secondary to patient habitus. Gallbladder: No gallstones or wall thickening visualized. No sonographic Murphy sign noted by sonographer. Common bile duct: Diameter: 0.4 cm Liver: Increased hepatic parenchymal echogenicity. LEFT hepatic lobe heterogeneously-echogenic mass with internal vascularity, measuring approximately 1.7 x 1.5 x 1.3 cm see key image. Portal vein is patent on color Doppler imaging with normal direction of blood flow towards the liver. Other: No perihepatic fluid. IMPRESSION: 1. Prominent 1.7 cm LEFT hepatic lobe mass. Findings correlate with lesion seen on comparison CT/MR, and suspicious for metastatic disease. 2. Echogenic liver. Findings most commonly seen in hepatic steatosis, though may also represent hepatitis and/or fibrosis. Roanna Banning, MD [facility] Electronically Signed   By: Roanna Banning M.D.   On: 06/27/2023 11:24   MR ABDOMEN W WO CONTRAST Result Date: 06/26/2023 CLINICAL DATA:  Lung cancer. Multiple liver lesions on staging abdomen/pelvis CT. EXAM: MRI ABDOMEN WITHOUT AND WITH CONTRAST TECHNIQUE: Multiplanar multisequence MR imaging of the abdomen was performed both before and after the administration of intravenous contrast. CONTRAST:  9mL GADAVIST GADOBUTROL 1 MMOL/ML IV SOLN COMPARISON:  Abdomen and pelvis CT 06/23/2023 FINDINGS: Lower chest: Small left lower lobe pulmonary nodule better characterized on recent CT imaging. Hepatobiliary: Multiple small liver lesions are seen in both hepatic lobes. Index lesion lateral segment left liver measures 14 mm on axial T2 image 17 of series 4. Index lesion in the medial right liver measures 8 mm on image 15/4. Lesions range in size from approximately 3-4 mm up to the dominant lateral segment left liver lesion at 14 mm. Lesions demonstrate restricted perfusion. Postcontrast imaging is markedly motion degraded which limits  assessment. Dominant lesions in the left liver demonstrate subtle irregular rim enhancement (see left hepatic dome lesion on 33/17 and lateral segment left liver lesion on image 36/12). Gallbladder is nondistended with tiny gallstone evident. No gallbladder wall thickening or pericholecystic fluid. No intrahepatic or extrahepatic biliary dilation. Pancreas: No focal mass lesion. No dilatation of the main duct. No intraparenchymal cyst. No peripancreatic edema. Spleen:  No splenomegaly. No suspicious focal mass lesion. Adrenals/Urinary Tract: No right adrenal nodule or mass. 15 mm left adrenal nodule shows loss of signal intensity on out of phase T1 imaging consistent with adrenal adenoma. Small cortical cysts are noted in both kidneys. 2.9 x 2.8 cm heterogeneously enhancing lesion is identified in the lower pole right kidney, not well evaluated on postcontrast imaging due to substantial motion degradation. Imaging features concerning for renal cell carcinoma. A second similar 13 mm lesion is identified in the interpolar right kidney (axial 72/17) which may have ill-defined peripheral enhancement, raising concern for neoplasm. 10 mm interpolar left renal lesion on 68/17 is indeterminate as enhancement cannot be excluded on the 3 minute delayed imaging. Stomach/Bowel: Tiny hiatal hernia. Stomach otherwise unremarkable. Duodenum does not cross the midline as expected consistent with intestinal malrotation. No small bowel or colonic dilatation within the visualized abdomen. Vascular/Lymphatic: No abdominal aortic aneurysm. No abdominal lymphadenopathy. Other:  No intraperitoneal free fluid. Musculoskeletal: No focal suspicious marrow enhancement within the visualized bony anatomy. IMPRESSION: 1. Multiple small liver lesions in both hepatic lobes, ranging in size from approximately 3-4 mm up  to the dominant lateral segment left liver lesion at 14 mm. Although postcontrast imaging is markedly motion degraded, imaging  features are concerning for metastatic disease. 2. 2.9 x 2.8 cm heterogeneously enhancing lesion in the lower pole right kidney, not well evaluated on postcontrast imaging due to substantial motion degradation. Imaging features concerning for renal cell carcinoma. A second similar 13 mm lesion is identified in the interpolar right kidney and a 10 mm interpolar left renal lesion are indeterminate as enhancement cannot be excluded on the 3 minute delayed imaging. Close follow-up recommended. 3. 15 mm left adrenal adenoma. 4. Cholelithiasis. 5. Tiny hiatal hernia. 6. Intestinal malrotation. Electronically Signed   By: Kennith Center M.D.   On: 06/26/2023 05:22   MR BRAIN W WO CONTRAST Result Date: 06/23/2023 CLINICAL DATA:  Metastatic disease evaluation.  Lung cancer. EXAM: MRI HEAD WITHOUT AND WITH CONTRAST TECHNIQUE: Multiplanar, multiecho pulse sequences of the brain and surrounding structures were obtained without and with intravenous contrast. CONTRAST:  10mL GADAVIST GADOBUTROL 1 MMOL/ML IV SOLN COMPARISON:  01/06/2009 FINDINGS: Brain: Diffusion imaging does not show any acute or subacute infarction or other cause of restricted diffusion. Brain shows atrophy with extensive chronic small-vessel ischemic changes affecting the pons and cerebral hemispheric white matter. No large vessel territory stroke. No evidence of mass lesion, hemorrhage, hydrocephalus or extra-axial collection. After contrast administration, no abnormal brain or leptomeningeal enhancement occurs. Vascular: Major vessels at the base of the brain show flow. Skull and upper cervical spine: Negative Sinuses/Orbits: Clear/normal Other: None IMPRESSION: No evidence of metastatic disease. Atrophy with extensive chronic small-vessel ischemic changes of the pons and cerebral hemispheric white matter. Electronically Signed   By: Paulina Fusi M.D.   On: 06/23/2023 14:33   CT ABDOMEN PELVIS W CONTRAST Result Date: 06/23/2023 CLINICAL DATA:  Staging  lung cancer.  * Tracking Code: BO * EXAM: CT ABDOMEN AND PELVIS WITH CONTRAST TECHNIQUE: Multidetector CT imaging of the abdomen and pelvis was performed using the standard protocol following bolus administration of intravenous contrast. RADIATION DOSE REDUCTION: This exam was performed according to the departmental dose-optimization program which includes automated exposure control, adjustment of the mA and/or kV according to patient size and/or use of iterative reconstruction technique. CONTRAST:  OMNIPAQUE IOHEXOL 300 MG/ML  SOLN COMPARISON:  Abdomen pelvis CT 10/05/2020. CT angiogram chest 06/22/2023 FINDINGS: Lower chest: Please correlate with CT of the chest from 06/22/2023. Again left lower lobe lung nodule seen on series 6, image 8. Pericardial effusion. Hepatobiliary: Multiple liver masses identified worrisome for metastatic disease. At least 7 lesions are identified. These small 1 of the larger foci seen anteriorly in segment 2 measuring 12 by 10 mm on series 2, image 12. Patent portal vein. Gallbladder is nondilated. Pancreas: Unremarkable. No pancreatic ductal dilatation or surrounding inflammatory changes. Spleen: T1 Adrenals/Urinary Tract: Right adrenal gland is slightly nodular. On series 2, image 25 this measures 13 by 11 mm. There is a left adrenal nodule as well measuring 16 by 10 mm. Indeterminate. There is contrast in the renal collecting systems related to the prior exam. Mild bilateral renal atrophy. There is a lower pole lesion on the right measuring on series 2, image 42 2.1 by 1.6 cm. This has Hounsfield units of 51 on portal venous phase in on delayed forty-three. This has with a lobular margins on coronal imaging and is suspicious. There are some other lesions in each kidney which are also indeterminate based on appearance today. Not clearly cysts. Ureters have normal course and  caliber down to the bladder. Preserved contours of the urinary bladder. Stomach/Bowel: Large bowel has  scattered stool and diverticula. There are redundant course of the colon diffusely. The cecum actually resides in the anterior left midabdomen. Normal appendix extends caudal to the cecum. Small bowel is nondilated with several small bowel loops extending to the right side lateral to the course of the ascending colon. Stomach is nondilated. Small hiatal hernia. Vascular/Lymphatic: Aortic atherosclerosis. No enlarged abdominal or pelvic lymph nodes. Reproductive: Prostate is unremarkable. Other: No free air or free fluid. Musculoskeletal: Scattered degenerative changes of the spine and pelvis. Transitional lumbosacral segment. IMPRESSION: Multiple aggressive appearing liver lesions worrisome for metastatic disease. Renal lesions are also seen which are indeterminate. One lesion in particular along the lower pole of the right kidney has a aggressive features and could be primary lesion versus metastatic focus. Bilateral adrenal nodules are noted which are indeterminate. Overall with although these areas MRI may be of some benefit with and without contrast dynamic to further delineate the etiology of the structures. No bowel obstruction. Colonic diverticula. Atypical course of the colon with the cecum in the anterior left midabdomen. Please correlate with previous CT angiogram of the chest. Electronically Signed   By: Karen Kays M.D.   On: 06/23/2023 11:43   ECHOCARDIOGRAM COMPLETE Result Date: 06/23/2023    ECHOCARDIOGRAM REPORT   Patient Name:   Yolanda Reeves Date of Exam: 06/23/2023 Medical Rec #:  [id number]       Height:       61.0 in Accession #:    [id number]      Weight:       216.9 lb Date of Birth:  09-03-1945       BSA:          1.955 m Patient Age:    77 years        BP:           149/71 mmHg Patient Gender: F               HR:           57 bpm. Exam Location:  Inpatient Procedure: 2D Echo, Cardiac Doppler and Color Doppler Indications:    R94.31 Abnormal EKG  History:        Patient has no prior  history of Echocardiogram examinations.                 COPD and HLD; Signs/Symptoms:Chest Pain.  Sonographer:    Webb Laws Referring Phys: [id number] ANASTASSIA DOUTOVA IMPRESSIONS  1. Left ventricular ejection fraction, by estimation, is 60 to 65%. The left ventricle has normal function. The left ventricle has no regional wall motion abnormalities. Left ventricular diastolic parameters are consistent with Grade I diastolic dysfunction (impaired relaxation). Elevated left ventricular end-diastolic pressure.  2. Right ventricular systolic function is normal. The right ventricular size is normal.  3. The mitral valve is abnormal. No evidence of mitral valve regurgitation. No evidence of mitral stenosis. Moderate mitral annular calcification.  4. The aortic valve is tricuspid. There is mild calcification of the aortic valve. There is mild thickening of the aortic valve. Aortic valve regurgitation is not visualized. Aortic valve sclerosis is present, with no evidence of aortic valve stenosis.  5. The inferior vena cava is dilated in size with >50% respiratory variability, suggesting right atrial pressure of 8 mmHg. FINDINGS  Left Ventricle: Left ventricular ejection fraction, by estimation, is 60 to 65%. The left ventricle has normal function. The  left ventricle has no regional wall motion abnormalities. The left ventricular internal cavity size was normal in size. There is  no left ventricular hypertrophy. Left ventricular diastolic parameters are consistent with Grade I diastolic dysfunction (impaired relaxation). Elevated left ventricular end-diastolic pressure. Right Ventricle: The right ventricular size is normal. No increase in right ventricular wall thickness. Right ventricular systolic function is normal. Left Atrium: Left atrial size was normal in size. Right Atrium: Right atrial size was normal in size. Pericardium: There is no evidence of pericardial effusion. Mitral Valve: The mitral valve is abnormal.  There is mild thickening of the mitral valve leaflet(s). There is mild calcification of the mitral valve leaflet(s). Moderate mitral annular calcification. No evidence of mitral valve regurgitation. No evidence  of mitral valve stenosis. Tricuspid Valve: The tricuspid valve is normal in structure. Tricuspid valve regurgitation is not demonstrated. No evidence of tricuspid stenosis. Aortic Valve: The aortic valve is tricuspid. There is mild calcification of the aortic valve. There is mild thickening of the aortic valve. Aortic valve regurgitation is not visualized. Aortic valve sclerosis is present, with no evidence of aortic valve stenosis. Pulmonic Valve: The pulmonic valve was normal in structure. Pulmonic valve regurgitation is not visualized. No evidence of pulmonic stenosis. Aorta: The aortic root is normal in size and structure. Venous: The inferior vena cava is dilated in size with greater than 50% respiratory variability, suggesting right atrial pressure of 8 mmHg. IAS/Shunts: No atrial level shunt detected by color flow Doppler.  LEFT VENTRICLE PLAX 2D LVIDd:         4.20 cm     Diastology LVIDs:         2.90 cm     LV e' medial:    3.26 cm/s LV PW:         1.10 cm     LV E/e' medial:  18.7 LV IVS:        0.90 cm     LV e' lateral:   4.35 cm/s LVOT diam:     1.90 cm     LV E/e' lateral: 14.0 LV SV:         55 LV SV Index:   28 LVOT Area:     2.84 cm  LV Volumes (MOD) LV vol d, MOD A2C: 35.3 ml LV vol d, MOD A4C: 34.1 ml LV vol s, MOD A2C: 8.3 ml LV vol s, MOD A4C: 10.4 ml LV SV MOD A2C:     27.0 ml LV SV MOD A4C:     34.1 ml LV SV MOD BP:      25.7 ml RIGHT VENTRICLE             IVC RV Basal diam:  3.90 cm     IVC diam: 2.30 cm RV S prime:     10.10 cm/s TAPSE (M-mode): 2.5 cm LEFT ATRIUM             Index        RIGHT ATRIUM           Index LA diam:        2.80 cm 1.43 cm/m   RA Area:     13.90 cm LA Vol (A2C):   18.1 ml 9.26 ml/m   RA Volume:   33.10 ml  16.93 ml/m LA Vol (A4C):   20.2 ml 10.33 ml/m  LA Biplane Vol: 20.0 ml 10.23 ml/m  AORTIC VALVE LVOT Vmax:   90.90 cm/s LVOT Vmean:  58.600 cm/s LVOT VTI:  0.195 m  AORTA Ao Root diam: 3.10 cm Ao Asc diam:  3.40 cm MITRAL VALVE               TRICUSPID VALVE MV Area (PHT): 2.69 cm    TR Peak grad:   13.1 mmHg MV Decel Time: 282 msec    TR Vmax:        181.00 cm/s MV E velocity: 60.80 cm/s MV A velocity: 97.30 cm/s  SHUNTS MV E/A ratio:  0.62        Systemic VTI:  0.20 m                            Systemic Diam: 1.90 cm Charlton Haws MD Electronically signed by Charlton Haws MD Signature Date/Time: 06/23/2023/9:51:44 AM    Final    CT Angio Chest Pulmonary Embolism (PE) W or WO Contrast Result Date: 06/22/2023 CLINICAL DATA:  Unresponsive vomiting EXAM: CT ANGIOGRAPHY CHEST WITH CONTRAST TECHNIQUE: Multidetector CT imaging of the chest was performed using the standard protocol during bolus administration of intravenous contrast. Multiplanar CT image reconstructions and MIPs were obtained to evaluate the vascular anatomy. RADIATION DOSE REDUCTION: This exam was performed according to the departmental dose-optimization program which includes automated exposure control, adjustment of the mA and/or kV according to patient size and/or use of iterative reconstruction technique. CONTRAST:  75mL OMNIPAQUE IOHEXOL 350 MG/ML SOLN COMPARISON:  06/22/2023 chest x-ray FINDINGS: Cardiovascular: Satisfactory opacification of the pulmonary arteries to the segmental level. No evidence of pulmonary embolism. Moderate aortic atherosclerosis. No aneurysm. Normal cardiac size. Small pericardial effusion. Coronary vascular calcification. Mediastinum/Nodes: Patent trachea. No suspicious thyroid mass. Enlarged AP window lymph nodes measuring up to 17 mm on series 4, image 39. Multiple enlarged left hilar nodes measuring up to 17 mm. Esophagus within normal limits. Small hiatal hernia Lungs/Pleura: No pleural effusion or pneumothorax. Large left apical lung mass measuring  approximately 6.2 by 4.6 by 4.5 cm on series 4, image 22 and series 6, image 102. Additional spiculated right upper lobe solid mass measuring 2.4 by 1.7 by 1.6 cm on series 12, image 32 and coronal series 6, image 92. Additional left lower lobe pulmonary nodule measuring 6 mm on series 12, image 60 and 3 mm left upper lobe pulmonary nodule on series 12, image 43. Scattered hazy pulmonary densities could be due to atelectasis or small airways disease. Upper Abdomen: No acute finding Musculoskeletal: No acute or suspicious osseous abnormality. Review of the MIP images confirms the above findings. IMPRESSION: 1. Negative for acute pulmonary embolus. 2. Large left apical lung mass measuring up to 6.2 cm, concerning for lung carcinoma. Additional spiculated solid mass in the right upper lobe measuring 2.4 cm which may represent metastatic lesion or synchronous lung CA. Additional small left-sided predominant pulmonary nodules measuring up to 6 mm in the left lower lobe are indeterminate for metastatic disease. 3. Enlarged left hilar and AP window lymph nodes, suspicious for metastatic disease. 4. Aortic atherosclerosis. Aortic Atherosclerosis (ICD10-I70.0). Electronically Signed   By: Jasmine Pang M.D.   On: 06/22/2023 23:48   DG Chest Port 1 View Result Date: 06/22/2023 CLINICAL DATA:  Unresponsive with vomiting. EXAM: PORTABLE CHEST 1 VIEW COMPARISON:  None Available. FINDINGS: The heart size and mediastinal contours are within normal limits. Low lung volumes are noted. Mild atelectatic changes are seen within the bilateral lung bases. A small patchy area of scarring, atelectasis and/or infiltrate is seen within the periphery of the  upper right lung. This is increased in severity when compared to the prior study. Opacification of the medial aspect of the left apex is also noted. No pleural effusion or pneumothorax is identified. Multilevel degenerative changes are seen throughout the thoracic spine. IMPRESSION: 1.  Low lung volumes with mild bibasilar atelectasis. 2. Small patchy area of scarring, atelectasis and/or infiltrate within the periphery of the upper right lung. 3. Opacification of the medial left apex which may represent an additional area of airspace disease. CT correlation is recommended to further exclude the presence of an underlying pulmonary or mediastinal mass. Electronically Signed   By: Aram Candela M.D.   On: 06/22/2023 20:08    Microbiology: Results for orders placed or performed during the hospital encounter of 06/22/23  Culture, blood (routine x 2) Call MD if unable to obtain prior to antibiotics being given     Status: None   Collection Time: 06/22/23 11:00 PM   Specimen: BLOOD RIGHT HAND  Result Value Ref Range Status   Specimen Description   Final    BLOOD RIGHT HAND Performed at [facility], [address] [place], [place] [address]    Special Requests   Final    BOTTLES DRAWN AEROBIC AND ANAEROBIC Blood Culture results may not be optimal due to an inadequate volume of blood received in culture bottles Performed at [facility], [address] [place], [place] [address]    Culture   Final    NO GROWTH 5 DAYS Performed at [facility], [address] [place], [place] [address]    Report Status 06/28/2023 FINAL  Final  Culture, blood (routine x 2) Call MD if unable to obtain prior to antibiotics being given     Status: None   Collection Time: 06/22/23 11:15 PM   Specimen: BLOOD LEFT HAND  Result Value Ref Range Status   Specimen Description   Final    BLOOD LEFT HAND Performed at [facility], [address], [place], [place] [address]    Special Requests   Final    BOTTLES DRAWN AEROBIC AND ANAEROBIC Blood Culture results may not be optimal due to an inadequate volume of blood received in culture bottles Performed at [facility], [address], [place], [place] [address]     Culture   Final    NO GROWTH 5 DAYS Performed at [facility], [address] [place], [place] [address]    Report Status 06/28/2023 FINAL  Final  Expectorated Sputum Assessment w Gram Stain, Rflx to Resp Cult     Status: None   Collection Time: 06/23/23  8:05 AM   Specimen: Expectorated Sputum  Result Value Ref Range Status   Specimen Description EXPECTORATED SPUTUM  Final   Special Requests NONE  Final   Sputum evaluation   Final    THIS SPECIMEN IS ACCEPTABLE FOR SPUTUM CULTURE Performed at [facility], [address], [place], [place] [address]    Report Status 06/24/2023 FINAL  Final  Culture, Respiratory w Gram Stain     Status: None   Collection Time: 06/23/23  8:05 AM  Result Value Ref Range Status   Specimen Description   Final    EXPECTORATED SPUTUM Performed at [facility], [address] [place], [place] [address]    Special Requests   Final    NONE Reflexed from [phone] Performed at [facility], [address]  [place], [place] [address]    Gram Stain   Final    NO WBC SEEN RARE GRAM POSITIVE COCCI IN CHAINS RARE GRAM POSITIVE RODS    Culture   Final    ABUNDANT Normal respiratory Yolanda-no Staph aureus or Pseudomonas seen Performed at [facility], [address], [place], [place] [address]    Report Status 06/25/2023 FINAL  Final    Labs: CBC: Recent Labs  Lab 07/06/23 0439  WBC 6.2  HGB 10.8*  HCT 34.7*  MCV 96.9  PLT 170   Basic Metabolic Panel: Recent Labs  Lab 07/06/23 0439  NA 136  K 4.2  CL 105  CO2 23  GLUCOSE 94  BUN 15  CREATININE 0.74  CALCIUM 8.3*   Liver Function Tests: Recent Labs  Lab 07/06/23 0439  AST 9*  ALT 8  ALKPHOS 65  BILITOT 0.6  PROT 6.5  ALBUMIN 3.1*   CBG: No results for input(s): "GLUCAP" in the last 168 hours.  Discharge time spent: 35 minutes.  Signed: Marcelino Duster, MD [facility] 07/10/2023

## 2023-07-10 NOTE — Progress Notes (Signed)
Report given to [facility], RN for in-patient Hospice care. No inquiries made PT will be in bed 10 at facility. PT currently has no non-verbal signs of pain. Family notified of discharge to facility all questions addressed. [facility] requested hospital IV stay in for transition to next location.

## 2023-07-10 NOTE — TOC Progression Note (Addendum)
Transition of Care [facility]) - Progression Note    Patient Details  Name: Yolanda Reeves MRN: [id number] Date of Birth: August 17, 1945  Transition [facility]) CM/SW Contact  Beckie Busing, RN Phone Number:[phone]  07/10/2023, 10:53 AM  Clinical Narrative:    [facility] acknowledges consult for "Inpatient hospice referral. Family has no preference though would like to stay in the [facility] area." Referral has been accepted by [facility]. TOC will continue to follow. Patient information is currently under review for residential hospice approval.     Expected Discharge Plan: Skilled Nursing Facility Barriers to Discharge: Awaiting State Approval (PASRR), Insurance Authorization  Expected Discharge Plan and Services In-house Referral: Clinical Social Work     Living arrangements for the past 2 months: Single Family Home                 DME Arranged: N/A DME Agency: NA                   Social Determinants of Health (SDOH) Interventions SDOH Screenings   Food Insecurity: No Food Insecurity (06/23/2023)  Housing: Low Risk  (06/23/2023)  Transportation Needs: Unmet Transportation Needs (06/23/2023)  Utilities: Not At Risk (06/23/2023)  Alcohol Screen: Low Risk  (10/25/2021)  Depression (PHQ2-9): Low Risk  (10/25/2021)  Financial Resource Strain: Low Risk  (10/25/2021)  Physical Activity: Insufficiently Active (10/25/2021)  Social Connections: Moderately Isolated (06/23/2023)  Stress: Stress Concern Present (10/25/2021)  Tobacco Use: Medium Risk (06/25/2023)    Readmission Risk Interventions     No data to display

## 2023-08-05 DEATH — deceased
